# Patient Record
Sex: Female | Born: 1990 | Race: Black or African American | Hispanic: No | Marital: Single | Smoking: Never smoker
Health system: Southern US, Community
[De-identification: ages and names within clinical notes are randomized; demographics above are authoritative.]

## PROBLEM LIST (undated history)

## (undated) ENCOUNTER — Inpatient Hospital Stay (HOSPITAL_COMMUNITY): Payer: Self-pay

## (undated) DIAGNOSIS — E119 Type 2 diabetes mellitus without complications: Secondary | ICD-10-CM

## (undated) DIAGNOSIS — N856 Intrauterine synechiae: Secondary | ICD-10-CM

## (undated) DIAGNOSIS — O24419 Gestational diabetes mellitus in pregnancy, unspecified control: Secondary | ICD-10-CM

## (undated) DIAGNOSIS — E282 Polycystic ovarian syndrome: Secondary | ICD-10-CM

## (undated) DIAGNOSIS — I10 Essential (primary) hypertension: Secondary | ICD-10-CM

## (undated) DIAGNOSIS — N883 Incompetence of cervix uteri: Secondary | ICD-10-CM

## (undated) DIAGNOSIS — O99810 Abnormal glucose complicating pregnancy: Secondary | ICD-10-CM

## (undated) DIAGNOSIS — O8612 Endometritis following delivery: Secondary | ICD-10-CM

## (undated) DIAGNOSIS — Z98891 History of uterine scar from previous surgery: Secondary | ICD-10-CM

## (undated) DIAGNOSIS — R7303 Prediabetes: Secondary | ICD-10-CM

## (undated) HISTORY — DX: Essential (primary) hypertension: I10

## (undated) HISTORY — DX: Prediabetes: R73.03

---
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
## 2010-10-12 ENCOUNTER — Emergency Department (HOSPITAL_COMMUNITY)
Admission: EM | Admit: 2010-10-12 | Discharge: 2010-10-12 | Disposition: A | Discharge status: Home / Self Care | Payer: BC Managed Care – PPO | Attending: Emergency Medicine | Admitting: Emergency Medicine

## 2010-10-12 DIAGNOSIS — N390 Urinary tract infection, site not specified: Secondary | ICD-10-CM | POA: Insufficient documentation

## 2010-10-12 DIAGNOSIS — R300 Dysuria: Secondary | ICD-10-CM | POA: Insufficient documentation

## 2010-10-12 LAB — URINALYSIS, ROUTINE W REFLEX MICROSCOPIC
Bilirubin Urine: NEGATIVE
Ketones, ur: NEGATIVE mg/dL
Protein, ur: 100 mg/dL — AB
Urobilinogen, UA: 1 mg/dL (ref 0.0–1.0)

## 2010-10-12 LAB — URINE MICROSCOPIC-ADD ON

## 2011-09-04 ENCOUNTER — Inpatient Hospital Stay (HOSPITAL_COMMUNITY)
Admission: AD | Admit: 2011-09-04 | Payer: BC Managed Care – PPO | Source: Home / Self Care | Admitting: Obstetrics and Gynecology

## 2012-01-26 DIAGNOSIS — N883 Incompetence of cervix uteri: Secondary | ICD-10-CM

## 2012-01-26 HISTORY — PX: DILATION AND CURETTAGE OF UTERUS: SHX78

## 2012-06-21 ENCOUNTER — Encounter (HOSPITAL_COMMUNITY): Payer: Self-pay | Admitting: *Deleted

## 2012-06-21 ENCOUNTER — Inpatient Hospital Stay (HOSPITAL_COMMUNITY)
Admission: AD | Admit: 2012-06-21 | Discharge: 2012-06-21 | Disposition: A | Discharge status: Home / Self Care | Payer: BC Managed Care – PPO | Source: Ambulatory Visit | Attending: Gynecology | Admitting: Gynecology

## 2012-06-21 DIAGNOSIS — N938 Other specified abnormal uterine and vaginal bleeding: Secondary | ICD-10-CM | POA: Insufficient documentation

## 2012-06-21 DIAGNOSIS — N949 Unspecified condition associated with female genital organs and menstrual cycle: Secondary | ICD-10-CM | POA: Insufficient documentation

## 2012-06-21 DIAGNOSIS — IMO0002 Reserved for concepts with insufficient information to code with codable children: Secondary | ICD-10-CM

## 2012-06-21 DIAGNOSIS — N920 Excessive and frequent menstruation with regular cycle: Secondary | ICD-10-CM

## 2012-06-21 DIAGNOSIS — N925 Other specified irregular menstruation: Secondary | ICD-10-CM | POA: Insufficient documentation

## 2012-06-21 LAB — CBC
Hemoglobin: 11.7 g/dL — ABNORMAL LOW (ref 12.0–15.0)
Platelets: 253 10*3/uL (ref 150–400)
RBC: 4.64 MIL/uL (ref 3.87–5.11)
WBC: 10.2 10*3/uL (ref 4.0–10.5)

## 2012-06-21 LAB — POCT PREGNANCY, URINE: Preg Test, Ur: NEGATIVE

## 2012-06-21 NOTE — MAU Note (Addendum)
Had D&C Jan 16 (17.5days SAB).  Saw doctor 06/04 (neg preg test).  Started bleeding 06/07,none on 06/08, 06/09 got really heavy. Last night was bleeding really really bad.  Lost a lot of blood. Is borderline anemic. Has been nauseated and having abd pain. Called office and was told to come here.  No period per say,  Passed clots in Mar, none in April and had clots again in May- that is why she made the appt.

## 2012-06-21 NOTE — Progress Notes (Signed)
Has MD appt on 6/16, but was advised to come to MAU today due to bleeding. States UPT was negative 6/4. Patient is to return to MD office for serum pregnancy test, stating "they never show positive in my urine."

## 2012-06-21 NOTE — MAU Provider Note (Signed)
History     CSN: [id number]  Arrival date and time: 06/21/12 1806   First Provider Initiated Contact with Patient 06/21/12 1927      Chief Complaint  Patient presents with  . Vaginal Bleeding   HPI Ms. Erika Townsend is a 22 y.o. G2P0020 who presents to MAU today with heavy vaginal bleeding. The patient states that she had a D&C for a failed pregnancy at [facility] on 01/26/12. She has had bleeding off and on since then. Sometimes it is heavier with clots. Last night she started bleeding very heavily with lots of clots. She has been feeling weak and dizzy. She denies a history of heavy periods. She states that she is scheduled for a follow-up US in the office next week to confirm that there are no retained products. She denies N/V or fever.   OB History   Grav Para Term Preterm Abortions TAB SAB Ect Mult Living   2    2  2    0      Past Medical History  Diagnosis Date  . Anemia     Past Surgical History  Procedure Laterality Date  . Dilation and curettage of uterus  01/26/2012    following a miscarriage    Family History  Problem Relation Age of Onset  . Stroke Maternal Grandmother   . Heart disease Maternal Grandmother   . Cancer Maternal Grandfather     History  Substance Use Topics  . Smoking status: Never Smoker   . Smokeless tobacco: Never Used  . Alcohol Use: No    Allergies:  Allergies  Allergen Reactions  . Penicillins Shortness Of Breath and Swelling    Prescriptions prior to admission  Medication Sig Dispense Refill  . metroNIDAZOLE (METROGEL) 0.75 % vaginal gel Place 1 Applicatorful vaginally daily. X 5 days      . Prenatal Vit-Fe Fumarate-FA (PRENATAL MULTIVITAMIN) TABS Take 1 tablet by mouth daily at 12 noon.        Review of Systems  Constitutional: Positive for malaise/fatigue. Negative for fever.  Gastrointestinal: Positive for abdominal pain. Negative for nausea, vomiting, diarrhea and constipation.  Genitourinary: Negative for  dysuria, urgency and frequency.       + vaginal bleeding  Neurological: Positive for dizziness and weakness. Negative for loss of consciousness.   Physical Exam   Blood pressure 125/83, pulse 83, temperature 98.9 F (37.2 C), temperature source Oral, resp. rate 18, weight 248 lb (112.492 kg), last menstrual period 06/16/2012.  Physical Exam  Constitutional: She is oriented to person, place, and time. She appears well-developed and well-nourished. No distress.  HENT:  Head: Normocephalic and atraumatic.  Cardiovascular: Normal rate, regular rhythm and normal heart sounds.   Respiratory: Breath sounds normal. No respiratory distress.  GI: Soft. Bowel sounds are normal. She exhibits no distension and no mass. There is tenderness (mild tenderness to palpatoin of the RUQ and lower abdomen). There is no rebound and no guarding.  Genitourinary: Uterus is not enlarged (exam limited by body habitus) and not tender. Cervix exhibits no motion tenderness, no discharge and no friability. Right adnexum displays no mass and no tenderness. Left adnexum displays tenderness. Left adnexum displays no mass. There is bleeding (scant bleeding noted in the vagina) around the vagina. No vaginal discharge found.  Neurological: She is alert and oriented to person, place, and time.  Skin: Skin is warm and dry. No erythema.  Psychiatric: She has a normal mood and affect.   Results for orders placed during  the hospital encounter of 06/21/12 (from the past 24 hour(s))  POCT PREGNANCY, URINE     Status: None   Collection Time    06/21/12  7:24 PM      Result Value Range   Preg Test, Ur NEGATIVE  NEGATIVE  CBC     Status: Abnormal   Collection Time    06/21/12  7:48 PM      Result Value Range   WBC 10.2  4.0 - 10.5 K/uL   RBC 4.64  3.87 - 5.11 MIL/uL   Hemoglobin 11.7 (*) 12.0 - 15.0 g/dL   HCT 40.9  81.1 - 91.4 %   MCV 78.9  78.0 - 100.0 fL   MCH 25.2 (*) 26.0 - 34.0 pg   MCHC 32.0  30.0 - 36.0 g/dL   RDW  78.2  95.6 - 21.3 %   Platelets 253  150 - 400 K/uL    MAU Course  Procedures None  MDM Discussed with Dr. Renaldo Fiddler. Draw CBC for Hgb and WBCs.  Discussed results with Dr. Renaldo Fiddler. No WBC elevation or significant anemia. Follow-up in the office in the morning for [place] and management.   Assessment and Plan  A: Irregular bleeding s/p D&C  P: Discharge home Bleeding precautions discussed Patient to call office first thing in the morning for follow-up appointment for tomorrow Patient may return to MAU as needed  Freddi Starr, PA-C  06/21/2012, 8:02 PM

## 2012-07-06 ENCOUNTER — Inpatient Hospital Stay (HOSPITAL_COMMUNITY)
Admission: AD | Admit: 2012-07-06 | Discharge: 2012-07-06 | Disposition: A | Discharge status: Home / Self Care | Payer: BC Managed Care – PPO | Source: Ambulatory Visit | Attending: Gynecology | Admitting: Gynecology

## 2012-07-06 ENCOUNTER — Encounter (HOSPITAL_COMMUNITY): Payer: Self-pay

## 2012-07-06 DIAGNOSIS — N390 Urinary tract infection, site not specified: Secondary | ICD-10-CM | POA: Insufficient documentation

## 2012-07-06 DIAGNOSIS — R300 Dysuria: Secondary | ICD-10-CM | POA: Insufficient documentation

## 2012-07-06 LAB — URINE MICROSCOPIC-ADD ON

## 2012-07-06 LAB — CBC
HCT: 36.2 % (ref 36.0–46.0)
Hemoglobin: 11.5 g/dL — ABNORMAL LOW (ref 12.0–15.0)
RDW: 14.9 % (ref 11.5–15.5)
WBC: 11.7 10*3/uL — ABNORMAL HIGH (ref 4.0–10.5)

## 2012-07-06 LAB — URINALYSIS, ROUTINE W REFLEX MICROSCOPIC
Glucose, UA: NEGATIVE mg/dL
Nitrite: POSITIVE — AB
pH: 6 (ref 5.0–8.0)

## 2012-07-06 LAB — WET PREP, GENITAL
Clue Cells Wet Prep HPF POC: NONE SEEN
Trich, Wet Prep: NONE SEEN
Yeast Wet Prep HPF POC: NONE SEEN

## 2012-07-06 MED ORDER — PHENAZOPYRIDINE HCL 100 MG PO TABS
200.0000 mg | ORAL_TABLET | Freq: Once | ORAL | Status: AC
  Administered 2012-07-06: 200 mg via ORAL
  Filled 2012-07-06: qty 2

## 2012-07-06 MED ORDER — PHENAZOPYRIDINE HCL 200 MG PO TABS: 200.0000 mg | ORAL_TABLET | Freq: Three times a day (TID) | ORAL | Status: DC | PRN

## 2012-07-06 MED ORDER — CIPROFLOXACIN HCL 500 MG PO TABS
500.0000 mg | ORAL_TABLET | Freq: Once | ORAL | Status: AC
  Administered 2012-07-06: 500 mg via ORAL
  Filled 2012-07-06: qty 1

## 2012-07-06 MED ORDER — CIPROFLOXACIN HCL 500 MG PO TABS: 500.0000 mg | ORAL_TABLET | Freq: Two times a day (BID) | ORAL | Status: DC

## 2012-07-06 NOTE — MAU Provider Note (Signed)
History     CSN: [id number]  Arrival date and time: 07/06/12 0149   None     Chief Complaint  Patient presents with  . Dysuria   Dysuria     Erika Townsend is a 22 y.o. G2P0020 who presents today with urinary discomfort x 2 days. She states that she feels her bladder is full, but she can't empty it. She also has a white vaginal discharge. She denies any itching, odor or irritation.    Past Medical History  Diagnosis Date  . Anemia     Past Surgical History  Procedure Laterality Date  . Dilation and curettage of uterus  01/26/2012    following a miscarriage    Family History  Problem Relation Age of Onset  . Stroke Maternal Grandmother   . Heart disease Maternal Grandmother   . Cancer Maternal Grandfather     History  Substance Use Topics  . Smoking status: Never Smoker   . Smokeless tobacco: Never Used  . Alcohol Use: No    Allergies:  Allergies  Allergen Reactions  . Penicillins Shortness Of Breath and Swelling    Prescriptions prior to admission  Medication Sig Dispense Refill  . Prenatal Vit-Fe Fumarate-FA (PRENATAL MULTIVITAMIN) TABS Take 1 tablet by mouth daily at 12 noon.      . progesterone (PROMETRIUM) 200 MG capsule Take 200 mg by mouth daily.      . metroNIDAZOLE (METROGEL) 0.75 % vaginal gel Place 1 Applicatorful vaginally daily. X 5 days        Review of Systems  Genitourinary: Positive for dysuria.   Physical Exam   Blood pressure 133/76, pulse 81, temperature 98.4 F (36.9 C), resp. rate 20, height 5' 2.5" (1.588 m), weight 112.038 kg (247 lb), last menstrual period 06/16/2012, SpO2 100.00%.  Physical Exam  Nursing note and vitals reviewed. Constitutional: She is oriented to person, place, and time. She appears well-developed and well-nourished. No distress.  Cardiovascular: Normal rate.   Respiratory: Effort normal.  GI: Soft. There is no tenderness.  Genitourinary:   .External: no lesion Vagina: small amount of white  discharge Cervix: pink, smooth, no CMT Uterus: NSSC Adnexa: NT   Neurological: She is alert and oriented to person, place, and time.  Skin: Skin is warm and dry.  Psychiatric: She has a normal mood and affect.    MAU Course  Procedures  Results for orders placed during the hospital encounter of 07/06/12 (from the past 24 hour(s))  CBC     Status: Abnormal   Collection Time    07/06/12  2:00 AM      Result Value Range   WBC 11.7 (*) 4.0 - 10.5 K/uL   RBC 4.49  3.87 - 5.11 MIL/uL   Hemoglobin 11.5 (*) 12.0 - 15.0 g/dL   HCT 40.9  81.1 - 91.4 %   MCV 80.6  78.0 - 100.0 fL   MCH 25.6 (*) 26.0 - 34.0 pg   MCHC 31.8  30.0 - 36.0 g/dL   RDW 78.2  95.6 - 21.3 %   Platelets 245  150 - 400 K/uL  URINALYSIS, ROUTINE W REFLEX MICROSCOPIC     Status: Abnormal   Collection Time    07/06/12  2:25 AM      Result Value Range   Color, Urine YELLOW  YELLOW   APPearance HAZY (*) CLEAR   Specific Gravity, Urine >1.030 (*) 1.005 - 1.030   pH 6.0  5.0 - 8.0   Glucose, UA NEGATIVE  NEGATIVE mg/dL   Hgb urine dipstick MODERATE (*) NEGATIVE   Bilirubin Urine NEGATIVE  NEGATIVE   Ketones, ur 15 (*) NEGATIVE mg/dL   Protein, ur 30 (*) NEGATIVE mg/dL   Urobilinogen, UA 0.2  0.0 - 1.0 mg/dL   Nitrite POSITIVE (*) NEGATIVE   Leukocytes, UA MODERATE (*) NEGATIVE  URINE MICROSCOPIC-ADD ON     Status: Abnormal   Collection Time    07/06/12  2:25 AM      Result Value Range   Squamous Epithelial / LPF RARE  RARE   WBC, UA 21-50  <3 WBC/hpf   RBC / HPF 3-6  <3 RBC/hpf   Bacteria, UA MANY (*) RARE  POCT PREGNANCY, URINE     Status: None   Collection Time    07/06/12  2:30 AM      Result Value Range   Preg Test, Ur NEGATIVE  NEGATIVE   Bladder scan shows 25cc of urine in the bladder.   Assessment and Plan   1. UTI (lower urinary tract infection)    RX: cipro 500mg  PO BID x 5 Pyridium 200mg  po TID X 2 days FU with GYN as needed  Tawnya Crook 07/06/2012, 2:48 AM

## 2012-07-06 NOTE — MAU Note (Signed)
Pt reports she had a D&C 01/26/2012 , was put on meds 2 weeks ago for prolonged bleeding. States since she started the medicine she has had trouble urinating. States her bladder feels full but she only goes a small amount, also has burning with urination.

## 2012-07-07 LAB — GC/CHLAMYDIA PROBE AMP
CT Probe RNA: NEGATIVE
GC Probe RNA: NEGATIVE

## 2012-07-08 LAB — URINE CULTURE

## 2012-09-06 ENCOUNTER — Inpatient Hospital Stay (HOSPITAL_COMMUNITY)
Admission: AD | Admit: 2012-09-06 | Discharge: 2012-09-06 | Disposition: A | Discharge status: Home / Self Care | Payer: BC Managed Care – PPO | Source: Ambulatory Visit | Attending: Obstetrics & Gynecology | Admitting: Obstetrics & Gynecology

## 2012-09-06 ENCOUNTER — Encounter (HOSPITAL_COMMUNITY): Payer: Self-pay

## 2012-09-06 ENCOUNTER — Inpatient Hospital Stay (HOSPITAL_COMMUNITY): Payer: BC Managed Care – PPO

## 2012-09-06 DIAGNOSIS — O210 Mild hyperemesis gravidarum: Secondary | ICD-10-CM | POA: Insufficient documentation

## 2012-09-06 DIAGNOSIS — O3680X0 Pregnancy with inconclusive fetal viability, not applicable or unspecified: Secondary | ICD-10-CM

## 2012-09-06 DIAGNOSIS — O219 Vomiting of pregnancy, unspecified: Secondary | ICD-10-CM

## 2012-09-06 DIAGNOSIS — R1032 Left lower quadrant pain: Secondary | ICD-10-CM | POA: Insufficient documentation

## 2012-09-06 DIAGNOSIS — O9989 Other specified diseases and conditions complicating pregnancy, childbirth and the puerperium: Secondary | ICD-10-CM

## 2012-09-06 DIAGNOSIS — O26891 Other specified pregnancy related conditions, first trimester: Secondary | ICD-10-CM

## 2012-09-06 LAB — URINALYSIS, ROUTINE W REFLEX MICROSCOPIC
Bilirubin Urine: NEGATIVE
Glucose, UA: NEGATIVE mg/dL
Hgb urine dipstick: NEGATIVE
Specific Gravity, Urine: 1.02 (ref 1.005–1.030)
Urobilinogen, UA: 1 mg/dL (ref 0.0–1.0)
pH: 7 (ref 5.0–8.0)

## 2012-09-06 LAB — WET PREP, GENITAL
Clue Cells Wet Prep HPF POC: NONE SEEN
Trich, Wet Prep: NONE SEEN

## 2012-09-06 LAB — CBC
HCT: 34.5 % — ABNORMAL LOW (ref 36.0–46.0)
MCHC: 33.9 g/dL (ref 30.0–36.0)
MCV: 80.6 fL (ref 78.0–100.0)
RDW: 13.8 % (ref 11.5–15.5)

## 2012-09-06 LAB — COMPREHENSIVE METABOLIC PANEL
BUN: 7 mg/dL (ref 6–23)
CO2: 22 mEq/L (ref 19–32)
Calcium: 9.3 mg/dL (ref 8.4–10.5)
Creatinine, Ser: 0.75 mg/dL (ref 0.50–1.10)
GFR calc Af Amer: >90 mL/min (ref >90)
GFR calc non Af Amer: >90 mL/min (ref >90)
Glucose, Bld: 80 mg/dL (ref 70–99)

## 2012-09-06 LAB — POCT PREGNANCY, URINE: Preg Test, Ur: POSITIVE — AB

## 2012-09-06 MED ORDER — ONDANSETRON HCL 4 MG PO TABS: 4.0000 mg | ORAL_TABLET | Freq: Four times a day (QID) | ORAL | Status: DC

## 2012-09-06 MED ORDER — PROMETHAZINE HCL 25 MG PO TABS: 25.0000 mg | ORAL_TABLET | Freq: Four times a day (QID) | ORAL | Status: DC | PRN

## 2012-09-06 MED ORDER — ONDANSETRON 8 MG PO TBDP
8.0000 mg | ORAL_TABLET | Freq: Once | ORAL | Status: AC
  Administered 2012-09-06: 8 mg via ORAL
  Filled 2012-09-06: qty 1

## 2012-09-06 NOTE — MAU Note (Signed)
Patient states she has been a patient of Dr. Chevis Pretty and has had a positive pregnancy test and has had an ultrasound with a due date of 04-22-13. Does not plan to go back to that practice. Patient states she has had vomiting of everything she eats or drinks for 3 days. Has had abdominal cramping. Denies bleeding or discharge.  Has periods of feeling dizzy.

## 2012-09-06 NOTE — MAU Provider Note (Signed)
Attestation of Attending Supervision of Advanced Practitioner (PA/CNM/NP): Evaluation and management procedures were performed by the Advanced Practitioner under my supervision and collaboration.  I have reviewed the Advanced Practitioner's note and chart, and I agree with the management and plan.  Taquanna Borras, MD, FACOG Attending Obstetrician & Gynecologist Faculty Practice, [facility]  

## 2012-09-06 NOTE — MAU Provider Note (Signed)
History     CSN: [id number]  Arrival date and time: 09/06/12 1008   First Provider Initiated Contact with Patient 09/06/12 1108      Chief Complaint  Patient presents with  . Emesis During Pregnancy  . Abdominal Pain   HPI Erika Townsend is a 22 y.o. G2P0010 at [redacted]w[redacted]d who presents to MAU today with complaint of N/V x 3 days and lower abdominal cramping. The patient rates her pain at 8/10 now. She has not taken any pain medication. She is also having some dizziness and feeling lightheaded. She states that the pain is more prominent in the LLQ. She denies UTI symptoms, vaginal bleeding, discharge, fever, diarrhea or constipation.    OB History   Grav Para Term Preterm Abortions TAB SAB Ect Mult Living   2    1  1    0      Past Medical History  Diagnosis Date  . Anemia     Past Surgical History  Procedure Laterality Date  . Dilation and curettage of uterus  01/26/2012    following a miscarriage    Family History  Problem Relation Age of Onset  . Stroke Maternal Grandmother   . Heart disease Maternal Grandmother   . Cancer Maternal Grandfather     History  Substance Use Topics  . Smoking status: Never Smoker   . Smokeless tobacco: Never Used  . Alcohol Use: No    Allergies:  Allergies  Allergen Reactions  . Penicillins Shortness Of Breath and Swelling    No prescriptions prior to admission    Review of Systems  Constitutional: Negative for fever and malaise/fatigue.  Gastrointestinal: Positive for nausea, vomiting and abdominal pain. Negative for diarrhea and constipation.  Genitourinary: Negative for dysuria, urgency and frequency.       Neg - vaginal bleeding ,discharge  Neurological: Positive for dizziness and weakness. Negative for loss of consciousness.   Physical Exam   Blood pressure 115/57, pulse 71, temperature 98.8 F (37.1 C), temperature source Oral, resp. rate 18, height 5\' 3"  (1.6 m), weight 248 lb 6.4 oz (112.674 kg), last menstrual  period 07/16/2012, SpO2 100.00%.  Physical Exam  Constitutional: She is oriented to person, place, and time. She appears well-developed and well-nourished. No distress.  HENT:  Head: Normocephalic and atraumatic.  Cardiovascular: Normal rate, regular rhythm and normal heart sounds.   Respiratory: Effort normal and breath sounds normal. No respiratory distress.  GI: Soft. Bowel sounds are normal. She exhibits no distension and no mass. There is tenderness (moderate tenderness to palpation of the lower abdomen more prominent on the LLQ). There is no rebound and no guarding.  Genitourinary: Uterus is not enlarged (exam limited by maternal body habitus) and not tender. Cervix exhibits no motion tenderness, no discharge and no friability. Right adnexum displays tenderness. Right adnexum displays no mass. Left adnexum displays tenderness. Left adnexum displays no mass. No bleeding around the vagina. Vaginal discharge (small amount of thin, white discharge noted) found.  Neurological: She is alert and oriented to person, place, and time.  Skin: Skin is warm and dry. No erythema.  Psychiatric: She has a normal mood and affect.   Results for orders placed during the hospital encounter of 09/06/12 (from the past 24 hour(s))  URINALYSIS, ROUTINE W REFLEX MICROSCOPIC     Status: None   Collection Time    09/06/12 10:30 AM      Result Value Range   Color, Urine YELLOW  YELLOW   APPearance CLEAR  CLEAR   Specific Gravity, Urine 1.020  1.005 - 1.030   pH 7.0  5.0 - 8.0   Glucose, UA NEGATIVE  NEGATIVE mg/dL   Hgb urine dipstick NEGATIVE  NEGATIVE   Bilirubin Urine NEGATIVE  NEGATIVE   Ketones, ur NEGATIVE  NEGATIVE mg/dL   Protein, ur NEGATIVE  NEGATIVE mg/dL   Urobilinogen, UA 1.0  0.0 - 1.0 mg/dL   Nitrite NEGATIVE  NEGATIVE   Leukocytes, UA NEGATIVE  NEGATIVE  POCT PREGNANCY, URINE     Status: Abnormal   Collection Time    09/06/12 10:41 AM      Result Value Range   Preg Test, Ur POSITIVE (*)  NEGATIVE  WET PREP, GENITAL     Status: Abnormal   Collection Time    09/06/12 11:17 AM      Result Value Range   Yeast Wet Prep HPF POC NONE SEEN  NONE SEEN   Trich, Wet Prep NONE SEEN  NONE SEEN   Clue Cells Wet Prep HPF POC NONE SEEN  NONE SEEN   WBC, Wet Prep HPF POC FEW (*) NONE SEEN  CBC     Status: Abnormal   Collection Time    09/06/12 11:40 AM      Result Value Range   WBC 11.5 (*) 4.0 - 10.5 K/uL   RBC 4.28  3.87 - 5.11 MIL/uL   Hemoglobin 11.7 (*) 12.0 - 15.0 g/dL   HCT 72.5 (*) 36.6 - 44.0 %   MCV 80.6  78.0 - 100.0 fL   MCH 27.3  26.0 - 34.0 pg   MCHC 33.9  30.0 - 36.0 g/dL   RDW 34.7  42.5 - 95.6 %   Platelets 244  150 - 400 K/uL  ABO/RH     Status: None   Collection Time    09/06/12 11:40 AM      Result Value Range   ABO/RH(D) O POS    HCG, QUANTITATIVE, PREGNANCY     Status: Abnormal   Collection Time    09/06/12 11:40 AM      Result Value Range   hCG, Beta Chain, Quant, S [id number] (*) <5 mIU/mL  COMPREHENSIVE METABOLIC PANEL     Status: None   Collection Time    09/06/12 11:40 AM      Result Value Range   Sodium 136  135 - 145 mEq/L   Potassium 3.6  3.5 - 5.1 mEq/L   Chloride 102  96 - 112 mEq/L   CO2 22  19 - 32 mEq/L   Glucose, Bld 80  70 - 99 mg/dL   BUN 7  6 - 23 mg/dL   Creatinine, Ser 4.33  0.50 - 1.10 mg/dL   Calcium 9.3  8.4 - 29.5 mg/dL   Total Protein 6.6  6.0 - 8.3 g/dL   Albumin 3.5  3.5 - 5.2 g/dL   AST 14  0 - 37 U/L   ALT 8  0 - 35 U/L   Alkaline Phosphatase 50  39 - 117 U/L   Total Bilirubin 0.6  0.3 - 1.2 mg/dL   GFR calc non Af Amer >90  >90 mL/min   GFR calc Af Amer >90  >90 mL/min    MAU Course  Procedures None  MDM ODT Zofran Wet prep, GC/Chlamydia, CBC, CMP, ABO/Rh, quant hCG and [place] today  Assessment and Plan  A: IUP at  7w 3d Nausea and vomiting in pregnancy Abdominal pain in pregnancy  P: Discharge home First trimester  warning signs discussed Rx for phenergan and zofran sent to patient's pharmacy Patient  referred to [facility] clinic for prenatal care Patient may return to MAU as needed or if her condition were to change or worsen  Freddi Starr, PA-C  09/06/2012, 3:00 PM

## 2012-09-15 ENCOUNTER — Inpatient Hospital Stay (HOSPITAL_COMMUNITY): Payer: BC Managed Care – PPO

## 2012-09-15 ENCOUNTER — Inpatient Hospital Stay (HOSPITAL_COMMUNITY)
Admission: AD | Admit: 2012-09-15 | Discharge: 2012-09-15 | Disposition: A | Discharge status: Home / Self Care | Payer: BC Managed Care – PPO | Source: Ambulatory Visit | Attending: Obstetrics and Gynecology | Admitting: Obstetrics and Gynecology

## 2012-09-15 ENCOUNTER — Encounter (HOSPITAL_COMMUNITY): Payer: Self-pay | Admitting: *Deleted

## 2012-09-15 DIAGNOSIS — O209 Hemorrhage in early pregnancy, unspecified: Secondary | ICD-10-CM

## 2012-09-15 DIAGNOSIS — N390 Urinary tract infection, site not specified: Secondary | ICD-10-CM | POA: Insufficient documentation

## 2012-09-15 DIAGNOSIS — O4690 Antepartum hemorrhage, unspecified, unspecified trimester: Secondary | ICD-10-CM

## 2012-09-15 DIAGNOSIS — O2390 Unspecified genitourinary tract infection in pregnancy, unspecified trimester: Secondary | ICD-10-CM | POA: Insufficient documentation

## 2012-09-15 LAB — URINALYSIS, ROUTINE W REFLEX MICROSCOPIC
Bilirubin Urine: NEGATIVE
Nitrite: POSITIVE — AB
Specific Gravity, Urine: 1.025 (ref 1.005–1.030)
Urobilinogen, UA: 1 mg/dL (ref 0.0–1.0)

## 2012-09-15 LAB — URINE MICROSCOPIC-ADD ON

## 2012-09-15 MED ORDER — PHENAZOPYRIDINE HCL 200 MG PO TABS: 200.0000 mg | ORAL_TABLET | Freq: Three times a day (TID) | ORAL | Status: DC

## 2012-09-15 MED ORDER — NITROFURANTOIN MONOHYD MACRO 100 MG PO CAPS: 100.0000 mg | ORAL_CAPSULE | Freq: Two times a day (BID) | ORAL | Status: DC

## 2012-09-15 NOTE — MAU Provider Note (Signed)
History     CSN: [id number]  Arrival date and time: 09/15/12 1105   First Provider Initiated Contact with Patient 09/15/12 1148      Chief Complaint  Patient presents with  . Vaginal Bleeding   HPI  Pt is G2P0010 and had intercourse this morning and had PC bleeding.  [facility] RN note: Registered Nurse Addendum MAU Note Service date: 09/15/2012 11:26 AM   Arrived via EMS. Bleeding started this morning around 1030. No clots. Cramping some. States it was painful when she tried to void for specimen and refused to try again.    Past Medical History  Diagnosis Date  . Anemia     Past Surgical History  Procedure Laterality Date  . Dilation and curettage of uterus  01/26/2012    following a miscarriage    Family History  Problem Relation Age of Onset  . Stroke Maternal Grandmother   . Heart disease Maternal Grandmother   . Cancer Maternal Grandfather     History  Substance Use Topics  . Smoking status: Never Smoker   . Smokeless tobacco: Never Used  . Alcohol Use: No    Allergies:  Allergies  Allergen Reactions  . Penicillins Shortness Of Breath and Swelling    Prescriptions prior to admission  Medication Sig Dispense Refill  . ondansetron (ZOFRAN) 4 MG tablet Take 1 tablet (4 mg total) by mouth every 6 (six) hours.  12 tablet  0  . Prenatal Vit-Fe Fumarate-FA (PRENATAL MULTIVITAMIN) TABS Take 1 tablet by mouth daily at 12 noon.      . promethazine (PHENERGAN) 25 MG tablet Take 1 tablet (25 mg total) by mouth every 6 (six) hours as needed for nausea.  30 tablet  0    Review of Systems  Constitutional: Negative for fever and chills.  Gastrointestinal: Positive for nausea and vomiting. Negative for abdominal pain, diarrhea and constipation.  Genitourinary: Positive for dysuria. Negative for urgency and frequency.   Physical Exam   Blood pressure 110/69, pulse 78, temperature 97.9 F (36.6 C), temperature source Oral, resp.  rate 18, last menstrual period 07/16/2012.  Physical Exam  Nursing note and vitals reviewed. Constitutional: She is oriented to person, place, and time. She appears well-developed and well-nourished. No distress.  HENT:  Head: Normocephalic.  Eyes: Pupils are equal, round, and reactive to light.  Neck: Normal range of motion. Neck supple.  Cardiovascular: Normal rate.   Respiratory: Effort normal.  GI: Soft. She exhibits no distension. There is no tenderness. There is no rebound and no guarding.  Genitourinary:  Small amount of bright red blood in vault; cervix unable to be visualized due to habitus; bimanual cervix closed nontender  Musculoskeletal: Normal range of motion.  Neurological: She is alert and oriented to person, place, and time.  Skin: Skin is warm and dry.  Psychiatric: She has a normal mood and affect.    MAU Course  Procedures Results for orders placed during the hospital encounter of 09/15/12 (from the past 24 hour(s))  URINALYSIS, ROUTINE W REFLEX MICROSCOPIC     Status: Abnormal   Collection Time    09/15/12 11:40 AM      Result Value Range   Color, Urine RED (*) YELLOW   APPearance CLOUDY (*) CLEAR   Specific Gravity, Urine 1.025  1.005 - 1.030   pH 6.0  5.0 - 8.0   Glucose, UA NEGATIVE  NEGATIVE mg/dL   Hgb urine dipstick LARGE (*) NEGATIVE  Bilirubin Urine NEGATIVE  NEGATIVE   Ketones, ur 15 (*) NEGATIVE mg/dL   Protein, ur 295 (*) NEGATIVE mg/dL   Urobilinogen, UA 1.0  0.0 - 1.0 mg/dL   Nitrite POSITIVE (*) NEGATIVE   Leukocytes, UA TRACE (*) NEGATIVE  URINE MICROSCOPIC-ADD ON     Status: Abnormal   Collection Time    09/15/12 11:40 AM      Result Value Range   Squamous Epithelial / LPF RARE  RARE   WBC, UA 3-6  <3 WBC/hpf   RBC / HPF TOO NUMEROUS TO COUNT  <3 RBC/hpf   Bacteria, UA FEW (*) RARE  US Ob Transvaginal  09/15/2012   *RADIOLOGY REPORT*  Clinical Data: 8 weeks 5 days pregnant with bleeding and pain. Evaluate viability.  TRANSVAGINAL OB  ULTRASOUND  Technique:  Transvaginal ultrasound was performed for evaluation of the gestation as well as the maternal uterus and adnexal regions.  Comparison: 09/06/2012  Findings: Intrauterine gestational sac identified.  Fetal pole with crown-rump length of 1.9 cm.  This corresponds to 8 weeks 4 days. Yolk sac identified.  Cardiac activity, with a heart rate of 165 beats per minute.  No residual subchorionic hemorrhage.  The ovaries are within normal limits.  Trace free pelvic fluid is likely physiologic.  IMPRESSION: Intrauterine pregnancy of 8 weeks 4 days with fetal heart rate of 165 beats per minute.  Resolution of previously described subchorionic hemorrhage.   Original Report Authenticated By: Jeronimo Greaves, M.D.   Urine culture pending;  pt left before results of urine were reviewed- attempted to contact pt- contact number for pt and emergency contact were not valid [facility] [address] [phone] to see if they have contact information- unable to get assistance on phone- LM for pharmacy to contact pt to let her know prescriptions were at pharmacy and also to let us know the pt got the message Dr. Marcelle Overlie aware  Assessment and Plan  Bleeding in pregnancy UTI in pregnancy- Macrobid and Pyridium sent to [facility]- unable to contact pt due to invalid numbers- LM for pharmacy to contact pt   Newell Wafer 09/15/2012, 11:48 AM

## 2012-09-15 NOTE — MAU Note (Addendum)
Arrived via EMS. Bleeding started this morning around 1030. No clots. Cramping some. States it was painful when she tried to void for specimen and refused to try again.

## 2012-09-15 NOTE — MAU Note (Signed)
Pt reprots she started having bright red vaginal bleeding this morning. Also C/amping.

## 2012-09-16 ENCOUNTER — Encounter (HOSPITAL_COMMUNITY): Payer: Self-pay | Admitting: *Deleted

## 2012-09-16 ENCOUNTER — Inpatient Hospital Stay (HOSPITAL_COMMUNITY)
Admission: AD | Admit: 2012-09-16 | Discharge: 2012-09-16 | Disposition: A | Discharge status: Home / Self Care | Payer: BC Managed Care – PPO | Source: Ambulatory Visit | Attending: Obstetrics and Gynecology | Admitting: Obstetrics and Gynecology

## 2012-09-16 DIAGNOSIS — O26891 Other specified pregnancy related conditions, first trimester: Secondary | ICD-10-CM

## 2012-09-16 DIAGNOSIS — R109 Unspecified abdominal pain: Secondary | ICD-10-CM | POA: Insufficient documentation

## 2012-09-16 DIAGNOSIS — O9989 Other specified diseases and conditions complicating pregnancy, childbirth and the puerperium: Secondary | ICD-10-CM

## 2012-09-16 DIAGNOSIS — N949 Unspecified condition associated with female genital organs and menstrual cycle: Secondary | ICD-10-CM

## 2012-09-16 DIAGNOSIS — O209 Hemorrhage in early pregnancy, unspecified: Secondary | ICD-10-CM | POA: Insufficient documentation

## 2012-09-16 LAB — CBC
Hemoglobin: 11.7 g/dL — ABNORMAL LOW (ref 12.0–15.0)
MCHC: 33.8 g/dL (ref 30.0–36.0)
RDW: 13.9 % (ref 11.5–15.5)

## 2012-09-16 LAB — URINALYSIS, ROUTINE W REFLEX MICROSCOPIC
Glucose, UA: NEGATIVE mg/dL
Nitrite: NEGATIVE
Protein, ur: NEGATIVE mg/dL
Urobilinogen, UA: 0.2 mg/dL (ref 0.0–1.0)

## 2012-09-16 LAB — URINE MICROSCOPIC-ADD ON

## 2012-09-16 MED ORDER — ONDANSETRON 8 MG PO TBDP: 8.0000 mg | ORAL_TABLET | Freq: Once | ORAL | Status: DC

## 2012-09-16 NOTE — MAU Provider Note (Signed)
  History     CSN: [id number]  Arrival date and time: 09/16/12 2142   First Provider Initiated Contact with Patient 09/16/12 2223      Chief Complaint  Patient presents with  . Abdominal Cramping  . Vaginal Bleeding   HPI  Erika Townsend is a 22 y.o. G2P0010 at [redacted]w[redacted]d who presents today with cramping and bleeding. She was seen yesterday for the same complaint and had an ultrasound done that showed a viable IUP. She states the pain is the same in nature, but slightly worse today. The bleeding is less today and is changing from red to brown.   Past Medical History  Diagnosis Date  . Anemia     Past Surgical History  Procedure Laterality Date  . Dilation and curettage of uterus  01/26/2012    following a miscarriage    Family History  Problem Relation Age of Onset  . Stroke Maternal Grandmother   . Heart disease Maternal Grandmother   . Cancer Maternal Grandfather     History  Substance Use Topics  . Smoking status: Never Smoker   . Smokeless tobacco: Never Used  . Alcohol Use: No    Allergies:  Allergies  Allergen Reactions  . Cinnamon Shortness Of Breath  . Penicillins Shortness Of Breath and Swelling    Prescriptions prior to admission  Medication Sig Dispense Refill  . Prenatal Vit-Fe Fumarate-FA (PRENATAL MULTIVITAMIN) TABS Take 1 tablet by mouth daily at 12 noon.      . nitrofurantoin, macrocrystal-monohydrate, (MACROBID) 100 MG capsule Take 1 capsule (100 mg total) by mouth 2 (two) times daily.  10 capsule  0  . phenazopyridine (PYRIDIUM) 200 MG tablet Take 1 tablet (200 mg total) by mouth 3 (three) times daily.  6 tablet  0    ROS Physical Exam   Blood pressure 109/66, pulse 83, temperature 97.4 F (36.3 C), temperature source Oral, resp. rate 18, height 5\' 4"  (1.626 m), weight 115.032 kg (253 lb 9.6 oz), last menstrual period 07/16/2012, SpO2 100.00%.  Physical Exam  MAU Course  Procedures  Results for orders placed during the hospital encounter  of 09/16/12 (from the past 24 hour(s))  CBC     Status: Abnormal   Collection Time    09/16/12 10:05 PM      Result Value Range   WBC 15.4 (*) 4.0 - 10.5 K/uL   RBC 4.24  3.87 - 5.11 MIL/uL   Hemoglobin 11.7 (*) 12.0 - 15.0 g/dL   HCT 30.8 (*) 65.7 - 84.6 %   MCV 81.6  78.0 - 100.0 fL   MCH 27.6  26.0 - 34.0 pg   MCHC 33.8  30.0 - 36.0 g/dL   RDW 96.2  95.2 - 84.1 %   Platelets 244  150 - 400 K/uL   2246: D/W Dr. Marcelle Overlie, ok for dc home. Plan to FU with the office this week and they will possibly repeat the ultrasound in the office.   Assessment and Plan   1. Pelvic pain complicating pregnancy, antepartum, first trimester    Danger signs reviewed Comfort measures reviewed FU with the office this week Tawnya Crook 09/16/2012, 10:31 PM

## 2012-09-16 NOTE — MAU Note (Signed)
Pt G2 P0 at 8.6wks with bleeding and cramping since Saturday, seen in MAU-was told everything is ok.  Pt having increased pain tonight, continues to have small amt of bleeding, no clots.

## 2012-09-17 LAB — URINE CULTURE: Colony Count: 15000

## 2012-10-13 ENCOUNTER — Inpatient Hospital Stay (HOSPITAL_COMMUNITY)
Admission: AD | Admit: 2012-10-13 | Discharge: 2012-10-13 | Disposition: A | Discharge status: Home / Self Care | Payer: BC Managed Care – PPO | Source: Ambulatory Visit | Attending: Obstetrics and Gynecology | Admitting: Obstetrics and Gynecology

## 2012-10-13 ENCOUNTER — Encounter (HOSPITAL_COMMUNITY): Payer: Self-pay | Admitting: *Deleted

## 2012-10-13 DIAGNOSIS — R0602 Shortness of breath: Secondary | ICD-10-CM | POA: Insufficient documentation

## 2012-10-13 DIAGNOSIS — O99891 Other specified diseases and conditions complicating pregnancy: Secondary | ICD-10-CM | POA: Insufficient documentation

## 2012-10-13 DIAGNOSIS — O210 Mild hyperemesis gravidarum: Secondary | ICD-10-CM | POA: Insufficient documentation

## 2012-10-13 DIAGNOSIS — R112 Nausea with vomiting, unspecified: Secondary | ICD-10-CM

## 2012-10-13 LAB — URINALYSIS, ROUTINE W REFLEX MICROSCOPIC
Bilirubin Urine: NEGATIVE
Glucose, UA: NEGATIVE mg/dL
Hgb urine dipstick: NEGATIVE
Ketones, ur: NEGATIVE mg/dL
pH: 6.5 (ref 5.0–8.0)

## 2012-10-13 MED ORDER — PROMETHAZINE HCL 25 MG/ML IJ SOLN
25.0000 mg | Freq: Once | INTRAVENOUS | Status: AC
  Administered 2012-10-13: 25 mg via INTRAVENOUS
  Filled 2012-10-13: qty 1

## 2012-10-13 MED ORDER — LACTATED RINGERS IV BOLUS (SEPSIS): 1000.0000 mL | Freq: Once | INTRAVENOUS | Status: DC

## 2012-10-13 NOTE — MAU Provider Note (Signed)
History     CSN: [id number]  Arrival date and time: 10/13/12 0043   None     Chief Complaint  Patient presents with  . Morning Sickness  . Emesis During Pregnancy  . Shortness of Breath  . Headache   HPI  Ms. Erika Townsend is a is a 22 y.o. female G2P0010 at [redacted]w[redacted]d who presents to MAU with shortness of breath, nausea and vomiting. She is actively vomiting in MAU. Today she has tried chicken, fries, potato with gravy, saltine crackers and peanut butter. The symptoms started today and have progressively gotten worse. She feels every time she eats it all comes back up. She has zofran and phenergan at home and did not try anything to help the nausea because she feels the medication does not work.   OB History   Grav Para Term Preterm Abortions TAB SAB Ect Mult Living   2    1  1    0      Past Medical History  Diagnosis Date  . Anemia     Past Surgical History  Procedure Laterality Date  . Dilation and curettage of uterus  01/26/2012    following a miscarriage    Family History  Problem Relation Age of Onset  . Stroke Maternal Grandmother   . Heart disease Maternal Grandmother   . Cancer Maternal Grandfather     History  Substance Use Topics  . Smoking status: Never Smoker   . Smokeless tobacco: Never Used  . Alcohol Use: No    Allergies:  Allergies  Allergen Reactions  . Cinnamon Shortness Of Breath  . Penicillins Shortness Of Breath and Swelling    Prescriptions prior to admission  Medication Sig Dispense Refill  . nitrofurantoin, macrocrystal-monohydrate, (MACROBID) 100 MG capsule Take 1 capsule (100 mg total) by mouth 2 (two) times daily.  10 capsule  0  . phenazopyridine (PYRIDIUM) 200 MG tablet Take 1 tablet (200 mg total) by mouth 3 (three) times daily.  6 tablet  0  . Prenatal Vit-Fe Fumarate-FA (PRENATAL MULTIVITAMIN) TABS Take 1 tablet by mouth daily at 12 noon.       Results for orders placed during the hospital encounter of 10/13/12 (from the  past 24 hour(s))  URINALYSIS, ROUTINE W REFLEX MICROSCOPIC     Status: None   Collection Time    10/13/12 12:50 AM      Result Value Range   Color, Urine YELLOW  YELLOW   APPearance CLEAR  CLEAR   Specific Gravity, Urine 1.025  1.005 - 1.030   pH 6.5  5.0 - 8.0   Glucose, UA NEGATIVE  NEGATIVE mg/dL   Hgb urine dipstick NEGATIVE  NEGATIVE   Bilirubin Urine NEGATIVE  NEGATIVE   Ketones, ur NEGATIVE  NEGATIVE mg/dL   Protein, ur NEGATIVE  NEGATIVE mg/dL   Urobilinogen, UA 0.2  0.0 - 1.0 mg/dL   Nitrite NEGATIVE  NEGATIVE   Leukocytes, UA NEGATIVE  NEGATIVE    Review of Systems  Constitutional: Negative for fever and chills.  Gastrointestinal: Positive for nausea and vomiting. Negative for abdominal pain, diarrhea and constipation.  Genitourinary: Negative for dysuria, urgency, frequency and hematuria.       No vaginal discharge. No vaginal bleeding. No dysuria.   Neurological: Positive for dizziness, tremors and weakness. Negative for headaches.   Physical Exam   Blood pressure 110/73, pulse 95, temperature 98.2 F (36.8 C), temperature source Oral, resp. rate 20, last menstrual period 07/16/2012, SpO2 100.00%. Fetal heart tones by  doppler: 166 bpm  Physical Exam  Constitutional: She is oriented to person, place, and time. She appears well-developed and well-nourished. No distress.  HENT:  Head: Normocephalic.  Neck: Neck supple.  Cardiovascular: Normal rate.   Respiratory: Effort normal. No respiratory distress. She has no wheezes.  GI: Soft. She exhibits no distension. There is no tenderness. There is no rebound and no guarding.  Neurological: She is alert and oriented to person, place, and time.  Skin: Skin is warm and dry. She is not diaphoretic.  Psychiatric: Her behavior is normal.    MAU Course  Procedures None  MDM +fht UA LR bolus with phenergan 25 mg. Pt resting well and has not had any further episodes of vomiting following phenergan infusion.   Consulted with Dr. Henderson Cloud at 0200: ok to discharge home  Assessment and Plan  A: Nausea and vomiting in pregnancy   P: Discharge home Take your medication as prescribed if vomiting occurs BRAT diet discussed Return to MAU if symptoms worsen Keep your scheduled appointment with your OB Dr.   Robley Fries, Victorino Dike IRENE FNP-C 10/13/2012, 3:08 AM

## 2012-10-13 NOTE — MAU Note (Signed)
Patient complains of nausea and vomiting all day. Shortness of breath and migraine for the last hour. Has medication for nausea and vomiting at home but did not take any.

## 2012-10-13 NOTE — MAU Note (Signed)
Patient states that the headache does not hurt.

## 2012-10-16 ENCOUNTER — Encounter: Payer: BC Managed Care – PPO | Admitting: Obstetrics and Gynecology

## 2012-10-16 ENCOUNTER — Ambulatory Visit (HOSPITAL_COMMUNITY): Payer: BC Managed Care – PPO | Attending: Obstetrics and Gynecology

## 2012-10-16 ENCOUNTER — Encounter: Payer: Self-pay | Admitting: Obstetrics and Gynecology

## 2012-10-18 ENCOUNTER — Other Ambulatory Visit: Payer: Self-pay

## 2012-10-18 ENCOUNTER — Encounter (HOSPITAL_COMMUNITY): Payer: Self-pay | Admitting: Pharmacist

## 2012-10-18 LAB — OB RESULTS CONSOLE GC/CHLAMYDIA
Chlamydia: NEGATIVE
Gonorrhea: NEGATIVE

## 2012-10-18 LAB — OB RESULTS CONSOLE RPR: RPR: NONREACTIVE

## 2012-10-18 LAB — OB RESULTS CONSOLE HIV ANTIBODY (ROUTINE TESTING): HIV: NONREACTIVE

## 2012-10-19 ENCOUNTER — Encounter: Payer: Self-pay | Admitting: *Deleted

## 2012-10-24 ENCOUNTER — Ambulatory Visit: Admit: 2012-10-24 | Payer: BC Managed Care – PPO | Admitting: Obstetrics and Gynecology

## 2012-10-24 SURGERY — CERCLAGE, CERVIX, VAGINAL APPROACH
Anesthesia: Spinal

## 2012-11-01 ENCOUNTER — Encounter: Payer: Self-pay | Admitting: *Deleted

## 2012-11-10 ENCOUNTER — Encounter (HOSPITAL_COMMUNITY): Payer: BC Managed Care – PPO | Admitting: Anesthesiology

## 2012-11-10 ENCOUNTER — Observation Stay (HOSPITAL_COMMUNITY)
Admission: AD | Admit: 2012-11-10 | Discharge: 2012-11-11 | Disposition: A | Discharge status: Home / Self Care | Payer: BC Managed Care – PPO | Source: Ambulatory Visit | Attending: Obstetrics and Gynecology | Admitting: Obstetrics and Gynecology

## 2012-11-10 ENCOUNTER — Ambulatory Visit (HOSPITAL_COMMUNITY): Payer: BC Managed Care – PPO

## 2012-11-10 ENCOUNTER — Inpatient Hospital Stay (HOSPITAL_COMMUNITY): Payer: BC Managed Care – PPO | Admitting: Anesthesiology

## 2012-11-10 ENCOUNTER — Inpatient Hospital Stay (HOSPITAL_COMMUNITY): Payer: BC Managed Care – PPO

## 2012-11-10 ENCOUNTER — Encounter (HOSPITAL_COMMUNITY): Payer: Self-pay

## 2012-11-10 ENCOUNTER — Encounter (HOSPITAL_COMMUNITY)
Admission: AD | Disposition: A | Discharge status: Home / Self Care | Payer: Self-pay | Source: Ambulatory Visit | Attending: Obstetrics and Gynecology

## 2012-11-10 DIAGNOSIS — N390 Urinary tract infection, site not specified: Secondary | ICD-10-CM

## 2012-11-10 DIAGNOSIS — O3430 Maternal care for cervical incompetence, unspecified trimester: Principal | ICD-10-CM | POA: Insufficient documentation

## 2012-11-10 DIAGNOSIS — O26879 Cervical shortening, unspecified trimester: Secondary | ICD-10-CM | POA: Insufficient documentation

## 2012-11-10 DIAGNOSIS — O26859 Spotting complicating pregnancy, unspecified trimester: Secondary | ICD-10-CM | POA: Insufficient documentation

## 2012-11-10 DIAGNOSIS — O2390 Unspecified genitourinary tract infection in pregnancy, unspecified trimester: Secondary | ICD-10-CM | POA: Insufficient documentation

## 2012-11-10 DIAGNOSIS — O26872 Cervical shortening, second trimester: Secondary | ICD-10-CM

## 2012-11-10 DIAGNOSIS — N883 Incompetence of cervix uteri: Secondary | ICD-10-CM | POA: Diagnosis present

## 2012-11-10 DIAGNOSIS — O3432 Maternal care for cervical incompetence, second trimester: Secondary | ICD-10-CM

## 2012-11-10 HISTORY — PX: CERVICAL CERCLAGE: SHX1329

## 2012-11-10 LAB — URINALYSIS, ROUTINE W REFLEX MICROSCOPIC
Glucose, UA: NEGATIVE mg/dL
Nitrite: NEGATIVE
Protein, ur: NEGATIVE mg/dL
Specific Gravity, Urine: 1.02 (ref 1.005–1.030)
pH: 6.5 (ref 5.0–8.0)

## 2012-11-10 LAB — URINE MICROSCOPIC-ADD ON

## 2012-11-10 LAB — CBC
MCHC: 33.7 g/dL (ref 30.0–36.0)
MCV: 81.5 fL (ref 78.0–100.0)
RDW: 13.6 % (ref 11.5–15.5)

## 2012-11-10 LAB — TYPE AND SCREEN
ABO/RH(D): O POS
Antibody Screen: NEGATIVE

## 2012-11-10 SURGERY — CERCLAGE, CERVIX, VAGINAL APPROACH
Anesthesia: Spinal | Site: Vagina | Wound class: Clean Contaminated

## 2012-11-10 MED ORDER — CALCIUM CARBONATE ANTACID 500 MG PO CHEW: 2.0000 | CHEWABLE_TABLET | ORAL | Status: DC | PRN

## 2012-11-10 MED ORDER — SODIUM CHLORIDE 0.9 % IJ SOLN
3.0000 mL | INTRAMUSCULAR | Status: DC | PRN
  Administered 2012-11-11: 3 mL via INTRAVENOUS

## 2012-11-10 MED ORDER — FAMOTIDINE IN NACL 20-0.9 MG/50ML-% IV SOLN
20.0000 mg | Freq: Once | INTRAVENOUS | Status: AC
  Administered 2012-11-10: 20 mg via INTRAVENOUS
  Filled 2012-11-10: qty 50

## 2012-11-10 MED ORDER — FENTANYL CITRATE 0.05 MG/ML IJ SOLN
INTRAMUSCULAR | Status: DC | PRN
  Administered 2012-11-10 (×3): 50 ug via INTRAVENOUS

## 2012-11-10 MED ORDER — OXYCODONE HCL 5 MG PO TABS
5.0000 mg | ORAL_TABLET | Freq: Once | ORAL | Status: DC | PRN
Start: 2012-11-10 — End: 2012-11-10

## 2012-11-10 MED ORDER — LACTATED RINGERS IV SOLN
INTRAVENOUS | Status: DC
  Administered 2012-11-10: 14:00:00 via INTRAVENOUS

## 2012-11-10 MED ORDER — PROMETHAZINE HCL 25 MG/ML IJ SOLN: 6.2500 mg | INTRAMUSCULAR | Status: DC | PRN

## 2012-11-10 MED ORDER — EPHEDRINE SULFATE 50 MG/ML IJ SOLN
INTRAMUSCULAR | Status: DC | PRN
  Administered 2012-11-10: 5 mg via INTRAVENOUS

## 2012-11-10 MED ORDER — ONDANSETRON HCL 4 MG/2ML IJ SOLN
INTRAMUSCULAR | Status: DC | PRN
  Administered 2012-11-10: 4 mg via INTRAVENOUS

## 2012-11-10 MED ORDER — FENTANYL CITRATE 0.05 MG/ML IJ SOLN
INTRAMUSCULAR | Status: AC
  Filled 2012-11-10: qty 2

## 2012-11-10 MED ORDER — ACETAMINOPHEN 325 MG PO TABS
650.0000 mg | ORAL_TABLET | ORAL | Status: DC | PRN
  Administered 2012-11-10 – 2012-11-11 (×2): 650 mg via ORAL
  Filled 2012-11-10 (×2): qty 2

## 2012-11-10 MED ORDER — PHENYLEPHRINE HCL 10 MG/ML IJ SOLN
INTRAMUSCULAR | Status: DC | PRN
  Administered 2012-11-10 (×2): 40 ug via INTRAVENOUS

## 2012-11-10 MED ORDER — SODIUM CHLORIDE 0.9 % IV SOLN: 250.0000 mL | INTRAVENOUS | Status: DC | PRN

## 2012-11-10 MED ORDER — CLINDAMYCIN PHOSPHATE 900 MG/50ML IV SOLN
900.0000 mg | Freq: Three times a day (TID) | INTRAVENOUS | Status: AC
  Administered 2012-11-10 – 2012-11-11 (×2): 900 mg via INTRAVENOUS
  Filled 2012-11-10 (×2): qty 50

## 2012-11-10 MED ORDER — MIDAZOLAM HCL 2 MG/2ML IJ SOLN
INTRAMUSCULAR | Status: AC
  Filled 2012-11-10: qty 2

## 2012-11-10 MED ORDER — DOCUSATE SODIUM 100 MG PO CAPS
100.0000 mg | ORAL_CAPSULE | Freq: Every day | ORAL | Status: DC
Start: 2012-11-11 — End: 2012-11-11
  Administered 2012-11-11: 100 mg via ORAL

## 2012-11-10 MED ORDER — ZOLPIDEM TARTRATE 5 MG PO TABS: 5.0000 mg | ORAL_TABLET | Freq: Every evening | ORAL | Status: DC | PRN

## 2012-11-10 MED ORDER — MIDAZOLAM HCL 2 MG/2ML IJ SOLN
INTRAMUSCULAR | Status: DC | PRN
  Administered 2012-11-10 (×2): 1 mg via INTRAVENOUS

## 2012-11-10 MED ORDER — LACTATED RINGERS IV SOLN
INTRAVENOUS | Status: DC | PRN
  Administered 2012-11-10 (×2): via INTRAVENOUS

## 2012-11-10 MED ORDER — GENTAMICIN SULFATE 40 MG/ML IJ SOLN
INTRAVENOUS | Status: AC
  Administered 2012-11-10: 100 mL via INTRAVENOUS
  Filled 2012-11-10: qty 9.75

## 2012-11-10 MED ORDER — SODIUM CHLORIDE 0.9 % IJ SOLN
3.0000 mL | Freq: Two times a day (BID) | INTRAMUSCULAR | Status: DC
  Administered 2012-11-10 – 2012-11-11 (×2): 3 mL via INTRAVENOUS

## 2012-11-10 MED ORDER — PHENYLEPHRINE 40 MCG/ML (10ML) SYRINGE FOR IV PUSH (FOR BLOOD PRESSURE SUPPORT)
PREFILLED_SYRINGE | INTRAVENOUS | Status: AC
  Filled 2012-11-10: qty 5

## 2012-11-10 MED ORDER — HYDROMORPHONE HCL PF 1 MG/ML IJ SOLN: 0.2500 mg | INTRAMUSCULAR | Status: DC | PRN

## 2012-11-10 MED ORDER — BUPIVACAINE IN DEXTROSE 0.75-8.25 % IT SOLN
INTRATHECAL | Status: DC | PRN
  Administered 2012-11-10: 1.5 mL via INTRATHECAL

## 2012-11-10 MED ORDER — CITRIC ACID-SODIUM CITRATE 334-500 MG/5ML PO SOLN
30.0000 mL | Freq: Once | ORAL | Status: AC
  Administered 2012-11-10: 30 mL via ORAL
  Filled 2012-11-10: qty 15

## 2012-11-10 MED ORDER — EPHEDRINE 5 MG/ML INJ
INTRAVENOUS | Status: AC
  Filled 2012-11-10: qty 10

## 2012-11-10 MED ORDER — MEPERIDINE HCL 25 MG/ML IJ SOLN: 6.2500 mg | INTRAMUSCULAR | Status: DC | PRN

## 2012-11-10 MED ORDER — PRENATAL MULTIVITAMIN CH
1.0000 | ORAL_TABLET | Freq: Every day | ORAL | Status: DC
  Administered 2012-11-11: 1 via ORAL
  Filled 2012-11-10: qty 1

## 2012-11-10 MED ORDER — ONDANSETRON HCL 4 MG/2ML IJ SOLN
INTRAMUSCULAR | Status: AC
  Filled 2012-11-10: qty 2

## 2012-11-10 MED ORDER — OXYCODONE HCL 5 MG/5ML PO SOLN: 5.0000 mg | Freq: Once | ORAL | Status: DC | PRN

## 2012-11-10 SURGICAL SUPPLY — 15 items
CATH ROBINSON RED A/P 16FR (CATHETERS) ×2 IMPLANT
CLOTH BEACON ORANGE TIMEOUT ST (SAFETY) ×2 IMPLANT
COUNTER NEEDLE 1200 MAGNETIC (NEEDLE) ×2 IMPLANT
GLOVE BIO SURGEON STRL SZ 6.5 (GLOVE) ×6 IMPLANT
GOWN STRL REIN XL XLG (GOWN DISPOSABLE) ×4 IMPLANT
NEEDLE MAYO .5 CIRCLE (NEEDLE) ×2 IMPLANT
PACK VAGINAL MINOR WOMEN LF (CUSTOM PROCEDURE TRAY) ×2 IMPLANT
PAD OB MATERNITY 4.3X12.25 (PERSONAL CARE ITEMS) ×2 IMPLANT
PAD PREP 24X48 CUFFED NSTRL (MISCELLANEOUS) ×2 IMPLANT
SUT POLYDEK 5 CE 75 36 (SUTURE) ×2 IMPLANT
SYR BULB IRRIGATION 50ML (SYRINGE) ×2 IMPLANT
TOWEL OR 17X24 6PK STRL BLUE (TOWEL DISPOSABLE) ×4 IMPLANT
TUBING NON-CON 1/4 X 20 CONN (TUBING) ×2 IMPLANT
WATER STERILE IRR 1000ML POUR (IV SOLUTION) ×2 IMPLANT
YANKAUER SUCT BULB TIP NO VENT (SUCTIONS) ×2 IMPLANT

## 2012-11-10 NOTE — MAU Provider Note (Signed)
  History     CSN: [id number]  Arrival date and time: 11/10/12 4696   First Provider Initiated Contact with Patient 11/10/12 1000      Chief Complaint  Patient presents with  . Vaginal Pain    Patient reports abdominal and vaginal pain when she uses the bathroom   HPI Erika Townsend 22 y.o. [redacted]w[redacted]d Was seen in the office yesterday with some lower abdominal pain.  Has continued to have bilateral lower abdominal pain.  Cervix in the office yesterday was 2.8 cm with no funneling.  Client has been scheduled to have a cerclage at 13 weeks but did not have it as she wants to be put to sleep for the cerclage and not have the "shot in my back".  Denies any leaking of fluid or vaginal bleeding.   OB History   Grav Para Term Preterm Abortions TAB SAB Ect Mult Living   2    1  1    0      Past Medical History  Diagnosis Date  . Anemia     Past Surgical History  Procedure Laterality Date  . Dilation and curettage of uterus  01/26/2012    following a miscarriage    Family History  Problem Relation Age of Onset  . Stroke Maternal Grandmother   . Heart disease Maternal Grandmother   . Cancer Maternal Grandfather     History  Substance Use Topics  . Smoking status: Never Smoker   . Smokeless tobacco: Never Used  . Alcohol Use: No    Allergies:  Allergies  Allergen Reactions  . Cinnamon Shortness Of Breath  . Penicillins Shortness Of Breath and Swelling    Prescriptions prior to admission  Medication Sig Dispense Refill  . Prenatal Vit-Fe Fumarate-FA (PRENATAL MULTIVITAMIN) TABS Take 1 tablet by mouth daily at 12 noon.        Review of Systems  Constitutional: Negative for fever.  Gastrointestinal: Positive for abdominal pain. Negative for nausea, vomiting, diarrhea and constipation.  Genitourinary: Positive for dysuria.       No vaginal bleeding. No leaking of fluid.   Physical Exam   Blood pressure 123/82, pulse 110, temperature 98.5 F (36.9 C), temperature source  Oral, resp. rate 18, height 5' 3.5" (1.613 m), weight 253 lb 4 oz (114.873 kg), last menstrual period 07/16/2012.  Physical Exam  Nursing note and vitals reviewed. Constitutional: She is oriented to person, place, and time. She appears well-developed and well-nourished. No distress.  HENT:  Head: Normocephalic.  Eyes: EOM are normal.  Neck: Neck supple.  Respiratory: Effort normal.  GI: Soft. There is tenderness. There is no rebound and no guarding.  Musculoskeletal: Normal range of motion.  Neurological: She is alert and oriented to person, place, and time.  Skin: Skin is warm and dry.  Psychiatric: She has a normal mood and affect.    [name]  Procedures Ultrasound for cervical length done and result - cervix is 0.8 cm with funneling of internal os noted.  Dr. Senaida Ores notified.  MDM 1020 Consult with Dr. Senaida Ores re: plan of care.  Will get limited ultrasound for cervical length.  Assessment and Plan  Cervical change at 16 weeks Probable UTI  Plan Admit to antenatal unit Dr. Senaida Ores will write orders and see patient there.  Erika Townsend 11/10/2012, 10:21 AM

## 2012-11-10 NOTE — Anesthesia Preprocedure Evaluation (Signed)
Anesthesia Evaluation  Patient identified by MRN, date of birth, ID band Patient awake    Reviewed: Allergy & Precautions, H&P , NPO status , Patient's Chart, lab work & pertinent test results  Airway Mallampati: II TM Distance: >3 FB Neck ROM: Full    Dental  (+) Dental Advisory Given   Pulmonary neg pulmonary ROS,  breath sounds clear to auscultation        Cardiovascular negative cardio ROS  Rhythm:Regular Rate:Normal     Neuro/Psych negative neurological ROS  negative psych ROS   GI/Hepatic negative GI ROS, Neg liver ROS,   Endo/Other  Morbid obesity  Renal/GU negative Renal ROS     Musculoskeletal negative musculoskeletal ROS (+)   Abdominal (+) + obese,   Peds  Hematology  (+) Blood dyscrasia, anemia ,   Anesthesia Other Findings   Reproductive/Obstetrics (+) Pregnancy                           Anesthesia Physical Anesthesia Plan  ASA: III  Anesthesia Plan: Spinal   Post-op Pain Management:    Induction:   Airway Management Planned:   Additional Equipment:   Intra-op Plan:   Post-operative Plan:   Informed Consent: I have reviewed the patients History and Physical, chart, labs and discussed the procedure including the risks, benefits and alternatives for the proposed anesthesia with the patient or authorized representative who has indicated his/her understanding and acceptance.   Dental advisory given  Plan Discussed with: CRNA  Anesthesia Plan Comments:         Anesthesia Quick Evaluation

## 2012-11-10 NOTE — Anesthesia Procedure Notes (Signed)
Spinal  Patient location during procedure: OR Start time: 11/10/2012 3:02 PM End time: 11/10/2012 3:06 PM Staffing Anesthesiologist: Lewie Loron R Performed by: anesthesiologist  Preanesthetic Checklist Completed: patient identified, site marked, surgical consent, pre-op evaluation, timeout performed, IV checked, risks and benefits discussed and monitors and equipment checked Spinal Block Patient position: sitting Prep: site prepped and draped and DuraPrep Patient monitoring: heart rate, continuous pulse ox and blood pressure Approach: midline Location: L3-4 Injection technique: single-shot Needle Needle type: Sprotte  Needle gauge: 24 G Needle length: 9 cm Assessment Sensory level: T6 Additional Notes Expiration date of kit checked and confirmed. Patient tolerated procedure well, without complications.

## 2012-11-10 NOTE — Anesthesia Postprocedure Evaluation (Signed)
Anesthesia Post Note  Patient: Erika Townsend  Procedure(s) Performed: Procedure(s) (LRB): CERCLAGE CERVICAL (N/A)  Anesthesia type: Spinal  Patient location: PACU  Post pain: Pain level controlled  Post assessment: Post-op Vital signs reviewed  Last Vitals: BP 107/64  Pulse 94  Temp(Src) 36.4 C (Oral)  Resp 21  Ht 5' 3.5" (1.613 m)  Wt 253 lb (114.76 kg)  BMI 44.11 kg/m2  SpO2 100%  LMP 07/16/2012  Post vital signs: Reviewed  Level of consciousness: sedated  Complications: No apparent anesthesia complications

## 2012-11-10 NOTE — MAU Note (Signed)
Patient reported she was seen in the office yesterday. Patient said her cervix was 2.8. Patient trying to decide if she wants a cerclage placed.

## 2012-11-10 NOTE — Op Note (Signed)
Operative note  Preop diagnosis Preterm pregnancy at 16 weeks and 5 days Incompetent cervix Cervical funneling and shortening on ultrasound  Postop diagnosis Same  Procedure McDonald cerclage x2 (not at 12:00 and 1:00)  Surgeon Dr. Huel Cote  Anesthesia Spinal  Fluids Estimated blood loss 50 cc Urine output 100 cc straight catheter prior procedure IV fluids approximately 1000 cc LR  Findings Cervix appeared closed however was somewhat short and appearing to measure approximately 1/2 cm in length.  Procedure Patient was taken to the operating room where spinal anesthesia was obtained without difficulty. She was prepped and draped in the normal sterile fashion in the dorsal lithotomy position. An appropriate time out was performed. A weighted speculum was then placed within the vagina and the cervix identified and examined with a closed appearance and somewhat shortened in nature. A McDonald pursestring cerclage was then performed and the counterclockwise circumferential fashion beginning at 12:00 and ending there with the knot tied in that location. The cervix appeared closed once this was tied down however one additional stitch was placed just behind that one for added support. This was also tied at 12 to 1:00. With no sutures in place the cervix appeared closed there is no active bleeding noted and there was no leakage of fluid noted. The speculum was were removed from the vagina and the patient was taken to the recovery room in good condition. Fetal heart tones were obtained and the recovery room and normal. Patient will be admitted for observation with a plan for discharge in 12-24 hours if she remains stable with no pain or leakage of fluid.

## 2012-11-10 NOTE — H&P (Signed)
Erika Townsend is a 22 y.o. female G2P010 at 52 5/7 weeks (EDD  04/22/13 by LMP c/w 8 week Erika Townsend) presenting for c/o increased pelvic pressure and discomfort.  Prenatal care complicated by h/o possible incompetent cervix with a 17 week loss in 1/14.  Pt reports she had some LOF and was seen at hospital and d/c home at around 17 weeks.  She then returned the next day with increased pressure and found to be 5cm dilated with baby foot in vagina.  She was admitted with expectant management and went on to deliver the baby several hours later.  I do not have the pathology report on the baby but the placenta showed mild chorioamnionitis and she did spike a temperature postpartum requiring a D&C for the placenta.  She came to our practice at 13 weeks and was counseled about a possible incompetent cervix and a cerclage was recommended and scheduled.  She then canceled the cerclage and stated she did not wish to proceed.  An Erika Townsend was performed 11/09/12 and showed the cervix to be 2.85 cm.  She returned to hospital with discomfort today and cervix re-measured and 0.8 cm with funneling noted.    Maternal Medical History:  Prenatal complications: Nephrolithiasis.     OB History   Grav Para Term Preterm Abortions TAB SAB Ect Mult Living   2    1  1    0    01/2012 17 week loss with D&C of placenta  Past Medical History  Diagnosis Date  . Anemia    Past Surgical History  Procedure Laterality Date  . Dilation and curettage of uterus  01/26/2012    following a miscarriage   Family History: family history includes Cancer in her maternal grandfather; Heart disease in her maternal grandmother; Stroke in her maternal grandmother. Social History:  reports that she has never smoked. She has never used smokeless tobacco. She reports that she does not drink alcohol or use illicit drugs.   Prenatal Transfer Tool  Maternal Diabetes: No Genetic Screening: Normal First trimester screen Maternal Ultrasounds/Referrals:  Abnormal:  Findings:   Other:shortened cervix Fetal Ultrasounds or other Referrals:  None Maternal Substance Abuse:  No Significant Maternal Medications:  None Significant Maternal Lab Results:  None Other Comments:  None  ROS    Blood pressure 123/82, pulse 110, temperature 98.5 F (36.9 C), temperature source Oral, resp. rate 18, height 5' 3.5" (1.613 m), weight 114.873 kg (253 lb 4 oz), last menstrual period 07/16/2012. Exam Physical Exam  Prenatal labs: ABO, Rh: --/--/O POS (08/28 1140) Antibody:   Negative  Rubella:  Immune RPR:   NR HBsAg:   Neg HIV:   NR GBS:   unknown   Assessment/Plan: Pt counseled again re: risks and benefits of cerclage including possible SROM and loss of baby.  Pt understands that with her cervical change she would,likely lose the pregnancy if the cerclage not attempted.  She has not eaten all day and we will proceed when OR available.   Erika Townsend 11/10/2012, 1:11 PM

## 2012-11-10 NOTE — Transfer of Care (Signed)
Immediate Anesthesia Transfer of Care Note  Patient: Erika Townsend  Procedure(s) Performed: Procedure(s): CERCLAGE CERVICAL (N/A)  Patient Location: PACU  Anesthesia Type:Spinal  Level of Consciousness: awake, alert  and oriented  Airway & Oxygen Therapy: Patient Spontanous Breathing  Post-op Assessment: Report given to PACU RN and Post -op Vital signs reviewed and stable  Post vital signs: Reviewed and stable  Complications: No apparent anesthesia complications

## 2012-11-10 NOTE — MAU Note (Signed)
Patient reports abdominal and vaginal pain when she uses the bathroom. She reports pain as a 10.

## 2012-11-11 NOTE — Progress Notes (Signed)
Patient ID: Erika Townsend, female   DOB: [birth year], 22 y.o.   MRN: [id number] Pt s/p cerclage x 2 for funneling and had some mild cramping, but no LOF or other issues.  Mild spotting only Voiding fine  afeb VSS FHT's good q shift   Will d/c home with pelvic rest and modified bedrest. She will take her classes from home online.

## 2012-11-11 NOTE — Progress Notes (Signed)
Utilization Review completed.  

## 2012-11-11 NOTE — Discharge Summary (Signed)
Physician Discharge Summary  Patient ID: Erika Townsend MRN: [id number] DOB/AGE: [birth year] 22 y.o.  Admit date: 11/10/2012 Discharge date: 11/11/2012  Admission Diagnoses: Incompetent Cervix                                         Intrauterine gestation at 16 5/7 weeks  Discharge Diagnoses: same  Active Problems:   Incompetent cervix   McDonald cerclage present   Discharged Condition: good  Hospital Course: Pt admitted  s/p cerclage for incompetent cervix with funneling down to 0.8 cm.  McDonald cerclage placed x 2 and pt admitted to overnight observation.  She did well with minimal cramping post cerclage and was d/c to home on bedrest and pelvic rest.  She will f/u in office in 2 weeks for anatomy US and cervical length.  Significant Diagnostic Studies: ultrasound  Treatments: Cerclage}  Discharge Exam: Blood pressure 109/48, pulse 94, temperature 98.3 F (36.8 C), temperature source Oral, resp. rate 18, height 5' 3.5" (1.613 m), weight 114.76 kg (253 lb), last menstrual period 07/16/2012, SpO2 100.00%. General appearance: alert and cooperative GI: soft NT  Disposition: 01-Home or Self Care     Medication List    ASK your doctor about these medications       prenatal multivitamin Tabs tablet  Take 1 tablet by mouth daily at 12 noon.         SignedOliver Pila 11/11/2012, 9:56 AM

## 2012-11-11 NOTE — Progress Notes (Signed)
4540 Assumed care for pt.

## 2012-11-12 ENCOUNTER — Encounter (HOSPITAL_COMMUNITY): Payer: Self-pay | Admitting: Obstetrics and Gynecology

## 2012-11-15 ENCOUNTER — Encounter (HOSPITAL_COMMUNITY): Payer: Self-pay

## 2012-11-15 ENCOUNTER — Other Ambulatory Visit: Payer: Self-pay

## 2012-11-15 ENCOUNTER — Inpatient Hospital Stay (HOSPITAL_COMMUNITY)
Admission: AD | Admit: 2012-11-15 | Discharge: 2012-11-15 | Disposition: A | Discharge status: Home / Self Care | Payer: BC Managed Care – PPO | Source: Ambulatory Visit | Attending: Obstetrics and Gynecology | Admitting: Obstetrics and Gynecology

## 2012-11-15 ENCOUNTER — Inpatient Hospital Stay (HOSPITAL_COMMUNITY): Payer: BC Managed Care – PPO

## 2012-11-15 DIAGNOSIS — R109 Unspecified abdominal pain: Secondary | ICD-10-CM | POA: Insufficient documentation

## 2012-11-15 DIAGNOSIS — O3432 Maternal care for cervical incompetence, second trimester: Secondary | ICD-10-CM

## 2012-11-15 DIAGNOSIS — O3430 Maternal care for cervical incompetence, unspecified trimester: Secondary | ICD-10-CM | POA: Insufficient documentation

## 2012-11-15 DIAGNOSIS — O3440 Maternal care for other abnormalities of cervix, unspecified trimester: Secondary | ICD-10-CM

## 2012-11-15 LAB — WET PREP, GENITAL: Yeast Wet Prep HPF POC: NONE SEEN

## 2012-11-15 MED ORDER — DOCUSATE SODIUM 100 MG PO CAPS: 100.0000 mg | ORAL_CAPSULE | Freq: Two times a day (BID) | ORAL | Status: DC

## 2012-11-15 NOTE — MAU Provider Note (Signed)
Chief Complaint: Abdominal Pain   First Provider Initiated Contact with Patient 11/15/12 1853     SUBJECTIVE HPI: Frankie Zito is a 22 y.o. G2P0010 at [redacted]w[redacted]d by LMP who presents to maternity admissions via EMS reporting pelvic pressure when having a bowel movement at home.  She did not actually have bowel movement today because she stopped and called EMS.  She has hx of 1 previous delivery at 17 weeks and cerclage placed this pregnancy on 11/10/12.  Pt declined prophylactic cerclage at 13 weeks but came to MAU with shortened cervix at 16 weeks and cerclage was placed.  She reports no pelvic pressure when lying down but does have mild pressure when standing. She denies vaginal bleeding, vaginal itching/burning, urinary symptoms, h/a, dizziness, n/v, or fever/chills.      Past Medical History  Diagnosis Date  . Anemia    Past Surgical History  Procedure Laterality Date  . Dilation and curettage of uterus  01/26/2012    following a miscarriage  . Cervical cerclage N/A 11/10/2012    Procedure: CERCLAGE CERVICAL;  Surgeon: Oliver Pila, MD;  Location: [facility];  Service: Gynecology;  Laterality: N/A;   History   Social History  . Marital Status: Single    Spouse Name: N/A    Number of Children: N/A  . Years of Education: N/A   Occupational History  . Not on file.   Social History Main Topics  . Smoking status: Never Smoker   . Smokeless tobacco: Never Used  . Alcohol Use: No  . Drug Use: No  . Sexual Activity: Yes    Birth Control/ Protection: None     Comment: Last had sex 09/15/12   Other Topics Concern  . Not on file   Social History Narrative  . No narrative on file   No current facility-administered medications on file prior to encounter.   Current Outpatient Prescriptions on File Prior to Encounter  Medication Sig Dispense Refill  . Prenatal Vit-Fe Fumarate-FA (PRENATAL MULTIVITAMIN) TABS Take 1 tablet by mouth daily at 12 noon.       Allergies  Allergen  Reactions  . Cinnamon Shortness Of Breath  . Penicillins Shortness Of Breath and Swelling    ROS: Pertinent items in HPI  OBJECTIVE Blood pressure 109/52, pulse 100, temperature 98.2 F (36.8 C), temperature source Oral, resp. rate 18, height 5' 3.5" (1.613 m), weight 114.902 kg (253 lb 5 oz), last menstrual period 07/16/2012. GENERAL: Well-developed, well-nourished female in no acute distress.  HEENT: Normocephalic HEART: normal rate RESP: normal effort ABDOMEN: Soft, non-tender EXTREMITIES: Nontender, no edema NEURO: Alert and oriented SPECULUM EXAM: Cerclage visible, cervix appears to be closed, no noticeable length visually, moderate amount white thin discharge at introitus prior to exam  LAB RESULTS Results for orders placed during the hospital encounter of 11/15/12 (from the past 24 hour(s))  WET PREP, GENITAL     Status: Abnormal   Collection Time    11/15/12  7:25 PM      Result Value Range   Yeast Wet Prep HPF POC NONE SEEN  NONE SEEN   Trich, Wet Prep NONE SEEN  NONE SEEN   Clue Cells Wet Prep HPF POC NONE SEEN  NONE SEEN   WBC, Wet Prep HPF POC FEW (*) NONE SEEN    IMAGING   US Ob Limited  11/15/2012   OBSTETRICAL ULTRASOUND: This exam was performed within a [facility]. The OB US report was generated in the AS system, and  faxed to the ordering physician.   This report is also available in [employer] and in the [employer]. See AS Obstetric US report.  US Ob Transvaginal  11/15/2012   OBSTETRICAL ULTRASOUND: This exam was performed within a [facility]. The OB US report was generated in the AS system, and faxed to the ordering physician.   This report is also available in [employer] and in the [employer]. See AS Obstetric US report.  Cervical length 1.2 cm, with cerclage intact.  A: S/P cerclage with pelvic pressure  P: 1. Cervical insufficiency in pregnancy in second  trimester, antepartum     Consult Dr Senaida Ores 2154: Discussed ultrasound with Dr. Senaida Ores, no significant changes. Will have her continue bed rest at home. Colace daily and milk of mag as needed.   Report to Thressa Sheller, CNM Holly [facility]ily [facility] Certified Nurse-Midwife 11/15/2012  6:54 PM

## 2012-11-15 NOTE — MAU Note (Signed)
Pt reports she went to the bathroom to have a bowel movement and felt a lot of pressure so she stopped. Pt had cerclage put in Saturday November 1st for an incompetent cervix. Pt states a pain comes and goes in lower pelvic area and rates it as an 8.

## 2012-11-17 ENCOUNTER — Encounter (HOSPITAL_COMMUNITY): Payer: Self-pay | Admitting: *Deleted

## 2012-11-17 ENCOUNTER — Inpatient Hospital Stay (HOSPITAL_COMMUNITY)
Admission: AD | Admit: 2012-11-17 | Discharge: 2012-11-18 | Disposition: A | Discharge status: Home / Self Care | Payer: BC Managed Care – PPO | Source: Ambulatory Visit | Attending: Obstetrics and Gynecology | Admitting: Obstetrics and Gynecology

## 2012-11-17 DIAGNOSIS — O209 Hemorrhage in early pregnancy, unspecified: Secondary | ICD-10-CM | POA: Insufficient documentation

## 2012-11-17 DIAGNOSIS — N883 Incompetence of cervix uteri: Secondary | ICD-10-CM

## 2012-11-17 DIAGNOSIS — O3430 Maternal care for cervical incompetence, unspecified trimester: Secondary | ICD-10-CM | POA: Insufficient documentation

## 2012-11-17 DIAGNOSIS — O4690 Antepartum hemorrhage, unspecified, unspecified trimester: Secondary | ICD-10-CM

## 2012-11-17 DIAGNOSIS — O4692 Antepartum hemorrhage, unspecified, second trimester: Secondary | ICD-10-CM

## 2012-11-17 NOTE — MAU Note (Signed)
Pt G2 P0 at 17.5wks with cerclage placed 1 wk ago.  Having small amt of bright red bleeding, no pain.

## 2012-11-17 NOTE — MAU Provider Note (Signed)
  History     CSN: [id number]  Arrival date and time: 11/17/12 2305   First Provider Initiated Contact with Patient 11/17/12 2354      Chief Complaint  Patient presents with  . Vaginal Bleeding   HPI pt is [redacted]w[redacted]d pregnant and started bleeding about 1 1/2 hours ago, noticed when she went to the bathroom. Pt had a bowel movement this morning, which was normal and no bleeding. Pt denies pain but has felt her abdomen tighten up This evening.  Pt has been home on bedrest.  Pt G2 P0 at 17.5wks with cerclage placed 1 wk ago. Having small amt of bright red bleeding, no pain.       Past Medical History  Diagnosis Date  . Anemia     Past Surgical History  Procedure Laterality Date  . Dilation and curettage of uterus  01/26/2012    following a miscarriage  . Cervical cerclage N/A 11/10/2012    Procedure: CERCLAGE CERVICAL;  Surgeon: Oliver Pila, MD;  Location: [facility];  Service: Gynecology;  Laterality: N/A;    Family History  Problem Relation Age of Onset  . Stroke Maternal Grandmother   . Heart disease Maternal Grandmother   . Cancer Maternal Grandfather     History  Substance Use Topics  . Smoking status: Never Smoker   . Smokeless tobacco: Never Used  . Alcohol Use: No    Allergies:  Allergies  Allergen Reactions  . Cinnamon Shortness Of Breath  . Penicillins Shortness Of Breath and Swelling    Prescriptions prior to admission  Medication Sig Dispense Refill  . Prenatal Vit-Fe Fumarate-FA (PRENATAL MULTIVITAMIN) TABS Take 1 tablet by mouth daily at 12 noon.      . docusate sodium (COLACE) 100 MG capsule Take 1 capsule (100 mg total) by mouth 2 (two) times daily.  60 capsule  1    Review of Systems  Constitutional: Negative for fever and chills.  Gastrointestinal: Negative for nausea, vomiting, abdominal pain, diarrhea and constipation.  Genitourinary: Negative for dysuria.   Physical Exam   Blood pressure 107/55, pulse 107, temperature 98.4 F (36.9  C), temperature source Oral, resp. rate 18, height 5\' 3"  (1.6 m), weight 115.032 kg (253 lb 9.6 oz), last menstrual period 07/16/2012.  Physical Exam  Nursing note and vitals reviewed. Constitutional: She is oriented to person, place, and time. She appears well-developed and well-nourished. No distress.  Cardiovascular: Normal rate.   Respiratory: Effort normal.  GI: Soft. She exhibits no distension. There is no tenderness. There is no rebound.  Genitourinary:  Mod amount of dark red blood invault; cervix appears to be closed with cerclage with speculum exam; no active bleeding noted FHT 162 bpm  Musculoskeletal: Normal range of motion.  Neurological: She is alert and oriented to person, place, and time.  Skin: Skin is warm and dry.  Psychiatric: She has a normal mood and affect.    MAU Course  Procedures  Discussed with Dr. Ambrose Mantle- will send home with continued bedrest   Assessment and Plan  Incompetent cervix with cerclage Home on bedrest  Erika Townsend 11/17/2012, 11:56 PM

## 2012-11-30 ENCOUNTER — Observation Stay (HOSPITAL_COMMUNITY): Payer: BC Managed Care – PPO

## 2012-11-30 ENCOUNTER — Inpatient Hospital Stay (HOSPITAL_COMMUNITY)
Admit: 2012-11-30 | Discharge: 2012-11-30 | Disposition: A | Discharge status: Home / Self Care | Payer: BC Managed Care – PPO | Attending: Obstetrics and Gynecology | Admitting: Obstetrics and Gynecology

## 2012-11-30 ENCOUNTER — Encounter (HOSPITAL_COMMUNITY): Payer: Self-pay | Admitting: *Deleted

## 2012-11-30 ENCOUNTER — Inpatient Hospital Stay (HOSPITAL_COMMUNITY)
Admission: AD | Admit: 2012-11-30 | Discharge: 2012-12-02 | DRG: 767 | Disposition: A | Discharge status: Home / Self Care | Payer: BC Managed Care – PPO | Source: Ambulatory Visit | Attending: Obstetrics and Gynecology | Admitting: Obstetrics and Gynecology

## 2012-11-30 DIAGNOSIS — O3430 Maternal care for cervical incompetence, unspecified trimester: Secondary | ICD-10-CM | POA: Diagnosis present

## 2012-11-30 DIAGNOSIS — N883 Incompetence of cervix uteri: Secondary | ICD-10-CM

## 2012-11-30 DIAGNOSIS — Z880 Allergy status to penicillin: Secondary | ICD-10-CM

## 2012-11-30 DIAGNOSIS — O4290 Premature rupture of membranes, unspecified as to length of time between rupture and onset of labor, unspecified weeks of gestation: Principal | ICD-10-CM | POA: Diagnosis present

## 2012-11-30 DIAGNOSIS — Z9889 Other specified postprocedural states: Secondary | ICD-10-CM

## 2012-11-30 LAB — URINALYSIS, ROUTINE W REFLEX MICROSCOPIC
Bilirubin Urine: NEGATIVE
Glucose, UA: NEGATIVE mg/dL
Ketones, ur: 40 mg/dL — AB
Specific Gravity, Urine: >1.03 — ABNORMAL HIGH (ref 1.005–1.030)
Urobilinogen, UA: 0.2 mg/dL (ref 0.0–1.0)
pH: 6 (ref 5.0–8.0)

## 2012-11-30 LAB — URINE MICROSCOPIC-ADD ON

## 2012-11-30 LAB — RPR: RPR Ser Ql: NONREACTIVE

## 2012-11-30 LAB — CBC
HCT: 34.2 % — ABNORMAL LOW (ref 36.0–46.0)
MCHC: 34.5 g/dL (ref 30.0–36.0)
Platelets: 202 10*3/uL (ref 150–400)
RDW: 13.5 % (ref 11.5–15.5)
WBC: 25.3 10*3/uL — ABNORMAL HIGH (ref 4.0–10.5)

## 2012-11-30 LAB — TYPE AND SCREEN: Antibody Screen: NEGATIVE

## 2012-11-30 LAB — AMNISURE RUPTURE OF MEMBRANE (ROM) NOT AT ARMC: Amnisure ROM: POSITIVE

## 2012-11-30 MED ORDER — ACETAMINOPHEN 325 MG PO TABS: 650.0000 mg | ORAL_TABLET | ORAL | Status: DC | PRN

## 2012-11-30 MED ORDER — LACTATED RINGERS IV SOLN: 500.0000 mL | INTRAVENOUS | Status: DC | PRN

## 2012-11-30 MED ORDER — GENTAMICIN SULFATE 40 MG/ML IJ SOLN
150.0000 mg | Freq: Once | INTRAVENOUS | Status: AC
  Administered 2012-11-30: 150 mg via INTRAVENOUS
  Filled 2012-11-30: qty 3.75

## 2012-11-30 MED ORDER — LIDOCAINE HCL (PF) 1 % IJ SOLN
30.0000 mL | INTRAMUSCULAR | Status: DC | PRN
  Filled 2012-11-30: qty 30

## 2012-11-30 MED ORDER — LACTATED RINGERS IV SOLN
INTRAVENOUS | Status: DC
  Administered 2012-12-01: 02:00:00 via INTRAVENOUS

## 2012-11-30 MED ORDER — BUTORPHANOL TARTRATE 1 MG/ML IJ SOLN
1.0000 mg | INTRAMUSCULAR | Status: DC | PRN
  Administered 2012-11-30 (×3): 2 mg via INTRAVENOUS
  Filled 2012-11-30 (×2): qty 2

## 2012-11-30 MED ORDER — GENTAMICIN SULFATE 40 MG/ML IJ SOLN
Freq: Three times a day (TID) | INTRAVENOUS | Status: DC
  Administered 2012-12-01: 04:00:00 via INTRAVENOUS
  Filled 2012-11-30 (×2): qty 3.75

## 2012-11-30 MED ORDER — SODIUM CHLORIDE 0.9 % IV SOLN: 2.0000 g | Freq: Four times a day (QID) | INTRAVENOUS | Status: DC

## 2012-11-30 MED ORDER — CLINDAMYCIN PHOSPHATE 900 MG/50ML IV SOLN
900.0000 mg | Freq: Three times a day (TID) | INTRAVENOUS | Status: DC
  Administered 2012-11-30: 900 mg via INTRAVENOUS
  Filled 2012-11-30 (×2): qty 50

## 2012-11-30 MED ORDER — OXYCODONE-ACETAMINOPHEN 5-325 MG PO TABS: 1.0000 | ORAL_TABLET | ORAL | Status: DC | PRN

## 2012-11-30 MED ORDER — ONDANSETRON HCL 4 MG/2ML IJ SOLN
4.0000 mg | Freq: Four times a day (QID) | INTRAMUSCULAR | Status: DC | PRN
  Administered 2012-11-30: 4 mg via INTRAVENOUS
  Filled 2012-11-30: qty 2

## 2012-11-30 MED ORDER — IBUPROFEN 600 MG PO TABS: 600.0000 mg | ORAL_TABLET | Freq: Four times a day (QID) | ORAL | Status: DC | PRN

## 2012-11-30 MED ORDER — OXYTOCIN 40 UNITS IN LACTATED RINGERS INFUSION - SIMPLE MED
62.5000 mL/h | INTRAVENOUS | Status: DC
  Administered 2012-12-01: 40 [IU] via INTRAVENOUS
  Filled 2012-11-30: qty 1000

## 2012-11-30 MED ORDER — OXYTOCIN BOLUS FROM INFUSION
500.0000 mL | INTRAVENOUS | Status: DC
  Administered 2012-12-01: 500 mL via INTRAVENOUS

## 2012-11-30 MED ORDER — LACTATED RINGERS IV SOLN
INTRAVENOUS | Status: DC
  Administered 2012-11-30: 17:00:00 via INTRAVENOUS

## 2012-11-30 MED ORDER — BUTORPHANOL TARTRATE 1 MG/ML IJ SOLN
2.0000 mg | INTRAMUSCULAR | Status: DC | PRN
  Administered 2012-11-30: 2 mg via INTRAVENOUS
  Filled 2012-11-30 (×2): qty 2

## 2012-11-30 MED ORDER — CITRIC ACID-SODIUM CITRATE 334-500 MG/5ML PO SOLN
30.0000 mL | ORAL | Status: DC | PRN
  Administered 2012-12-01: 30 mL via ORAL
  Filled 2012-11-30: qty 15

## 2012-11-30 NOTE — Progress Notes (Signed)
Patient ID: Erika Townsend, female   DOB: Sep 28, 1990, 22 y.o.   MRN: [id number]  Pt with ctx, d/w pt and family prognosis and situation with ROM at 19wk and laboring.  Voice understanding, questions answered.  AFVSS gen NAD, uncomf SSE - feet in vagina Cerclage clipped x 2 SVE 4.5/90/0-+1  Will continue gent/clinda Anticipate SVD, soon

## 2012-11-30 NOTE — MAU Note (Signed)
Patient states she had a cervical cerclage placed on 11-1. States she started having abdominal pain and a yellow vaginal discharge last night.

## 2012-11-30 NOTE — MAU Provider Note (Signed)
Chief Complaint: Abdominal Pain and Vaginal Discharge   First Provider Initiated Contact with Patient 11/30/12 1149     SUBJECTIVE HPI: Erika Townsend is a 22 y.o. G2P0010 at [redacted]w[redacted]d by LMP who presents to maternity admissions reporting yellow vaginal discharge and pelvic pressure since this morning.  She reports she has not had a bowel movement x1 week.  She has a cerclage in place and has hx of delivery at 17 weeks with previous pregnancy.  She denies vaginal bleeding, vaginal itching/burning, urinary symptoms, h/a, dizziness, n/v, or fever/chills.   Past Medical History  Diagnosis Date  . Anemia    Past Surgical History  Procedure Laterality Date  . Dilation and curettage of uterus  01/26/2012    following a miscarriage  . Cervical cerclage N/A 11/10/2012    Procedure: CERCLAGE CERVICAL;  Surgeon: Oliver Pila, MD;  Location: [facility];  Service: Gynecology;  Laterality: N/A;   History   Social History  . Marital Status: Single    Spouse Name: N/A    Number of Children: N/A  . Years of Education: N/A   Occupational History  . Not on file.   Social History Main Topics  . Smoking status: Never Smoker   . Smokeless tobacco: Never Used  . Alcohol Use: No  . Drug Use: No  . Sexual Activity: Not Currently    Birth Control/ Protection: None     Comment: last intercourse 09/15/2012   Other Topics Concern  . Not on file   Social History Narrative  . No narrative on file   No current facility-administered medications on file prior to encounter.   Current Outpatient Prescriptions on File Prior to Encounter  Medication Sig Dispense Refill  . Prenatal Vit-Fe Fumarate-FA (PRENATAL MULTIVITAMIN) TABS Take 1 tablet by mouth daily at 12 noon.       Allergies  Allergen Reactions  . Cinnamon Shortness Of Breath  . Penicillins Shortness Of Breath and Swelling    ROS: Pertinent items in HPI  OBJECTIVE Blood pressure 93/75, pulse 125, temperature 99.1 F (37.3 C), temperature  source Oral, resp. rate 20, last menstrual period 07/16/2012, SpO2 100.00%. GENERAL: Well-developed, well-nourished female in no acute distress.  HEENT: Normocephalic HEART: normal rate RESP: normal effort ABDOMEN: Soft, non-tender EXTREMITIES: Nontender, no edema NEURO: Alert and oriented  Speculum exam: Pt noted to have clear/yellow fluid leaking onto bed prior to exam.  Gentle speculum exam, with positive pooling of fluid, cerclage visible, cervix visually 1 cm with cerclage stitch stretched over open os, no bleeding noted.    LAB RESULTS Results for orders placed during the hospital encounter of 11/30/12 (from the past 24 hour(s))  URINALYSIS, ROUTINE W REFLEX MICROSCOPIC     Status: Abnormal   Collection Time    11/30/12 10:48 AM      Result Value Range   Color, Urine YELLOW  YELLOW   APPearance HAZY (*) CLEAR   Specific Gravity, Urine >1.030 (*) 1.005 - 1.030   pH 6.0  5.0 - 8.0   Glucose, UA NEGATIVE  NEGATIVE mg/dL   Hgb urine dipstick MODERATE (*) NEGATIVE   Bilirubin Urine NEGATIVE  NEGATIVE   Ketones, ur 40 (*) NEGATIVE mg/dL   Protein, ur NEGATIVE  NEGATIVE mg/dL   Urobilinogen, UA 0.2  0.0 - 1.0 mg/dL   Nitrite NEGATIVE  NEGATIVE   Leukocytes, UA MODERATE (*) NEGATIVE  URINE MICROSCOPIC-ADD ON     Status: Abnormal   Collection Time    11/30/12 10:48 AM  Result Value Range   Squamous Epithelial / LPF FEW (*) RARE   WBC, UA TOO NUMEROUS TO COUNT  <3 WBC/hpf   RBC / HPF 3-6  <3 RBC/hpf   Bacteria, UA RARE  RARE   Urine-Other MUCOUS PRESENT    AMNISURE RUPTURE OF MEMBRANE (ROM)     Status: None   Collection Time    11/30/12 12:10 PM      Result Value Range   Amnisure ROM POSITIVE       ASSESSMENT PPROM @ [redacted]w[redacted]d Cerclage in place   PLAN Consult Dr Ellyn Hack Dr Ellyn Hack to room to see pt MFM Consult Limited US      Medication List    ASK your doctor about these medications       prenatal multivitamin Tabs tablet  Take 1 tablet by mouth daily at 12  noon.         Sharen Counter Certified Nurse-Midwife 11/30/2012  2:01 PM

## 2012-11-30 NOTE — Progress Notes (Signed)
ANTIBIOTIC CONSULT NOTE - INITIAL  Pharmacy Consult for Gentamicin Indication: PPROM  Allergies  Allergen Reactions  . Cinnamon Shortness Of Breath  . Penicillins Shortness Of Breath and Swelling    Patient Measurements: Height: 5\' 3"  (160 cm) Weight: 254 lb (115.214 kg) IBW/kg (Calculated) : 52.4 Adjusted Body Weight: 71.2kg  Vital Signs: Temp: 100.3 F (37.9 C) (11/21 1950) Temp src: Oral (11/21 1950) BP: 118/43 mmHg (11/21 1950) Pulse Rate: 117 (11/21 1950) I    Labs:  Recent Labs  11/30/12 1730  WBC 25.3*  HGB 11.8*  PLT 202   Estimated Creatinine Clearance: 133.8 ml/min (by C-G formula based on Cr of 0.75).   Microbiology: Recent Results (from the past 720 hour(s))  WET PREP, GENITAL     Status: Abnormal   Collection Time    11/15/12  7:25 PM      Result Value Range Status   Yeast Wet Prep HPF POC NONE SEEN  NONE SEEN Final   Trich, Wet Prep NONE SEEN  NONE SEEN Final   Clue Cells Wet Prep HPF POC NONE SEEN  NONE SEEN Final   WBC, Wet Prep HPF POC FEW (*) NONE SEEN Final   Comment: FEW BACTERIA SEEN    Medical History: Past Medical History  Diagnosis Date  . Anemia     Medications:  Clindamycin 900mg  IV q8h Assessment: 22yo F admitted with PPROM and laboring at 19+ weeks. Pt's cerclage has been clipped and antibiotics initiated for PPROM.  Goal of Therapy:  Gentamicin peak 6-10mcg/ml and trough < 62mcg/ml  Plan:  1. Gentamicin 150mg  IV x 1. Pt has already received one dose of Clindamycin 900mg  at 1730. Will combine Gentamicin and Clindamycin in next dose. 2. Will continue to follow and check SCr in am. Will assess need for kinetic levels based on duration of therapy. Thanks!  Claybon Jabs 11/30/2012,8:09 PM

## 2012-11-30 NOTE — H&P (Addendum)
Erika Townsend is a 22 y.o. female initially presenting to MAU with discharge, increased and ROM confirmed by [facility].  Pt with h/o 17wk . McDonald cerclage placed at 16 wk x 2 knot at 12 o'clock.  At last check funnelling down to cerclage.  CL = 2.8 initially, shortened to .8cm.   Nl anat, ant plac. 19wk IUP.  [place] in MFM, confirms increased dilitation, ? Cerclage pulled thru.     Maternal Medical History:  Reason for admission: Rupture of membranes.   Contractions: Onset was 3-5 hours ago.   Frequency: irregular.   Perceived severity is moderate.      OB History   Grav Para Term Preterm Abortions TAB SAB Ect Mult Living   2    1  1    0    G1 17 wk D&E at [facility] G2 present No abn pap, no STD  Past Medical History  Diagnosis Date  . Anemia   allergies, seasonal  Past Surgical History  Procedure Laterality Date  . Dilation and curettage of uterus  01/26/2012    following a miscarriage  . Cervical cerclage N/A 11/10/2012    Procedure: CERCLAGE CERVICAL;  Surgeon: Erika Pila, MD;  Location: [facility];  Service: Gynecology;  Laterality: N/A;   Family History: family history includes Cancer in her maternal grandfather; Heart disease in her maternal grandmother; Stroke in her maternal grandmother. Social History:  reports that she has never smoked. She has never used smokeless tobacco. She reports that she does not drink alcohol or use illicit drugs.single Meds PNV All PCN, Cinnamon   Prenatal Transfer Tool  Review of Systems  Constitutional: Negative.   HENT: Negative.   Eyes: Negative.   Respiratory: Negative.   Cardiovascular: Negative.   Gastrointestinal: Negative.   Genitourinary: Negative.   Musculoskeletal: Negative.   Skin: Negative.   Neurological: Negative.   Psychiatric/Behavioral: Negative.       Blood pressure 86/40, pulse 115, temperature 99.7 F (37.6 C), temperature source Oral, resp. rate 20, height 5\' 3"  (1.6 m), weight 115.214 kg (254 lb), last  menstrual period 07/16/2012, SpO2 100.00%. Maternal Exam:  Abdomen: Fundal height is appropriate for gestation.   Fetal presentation: breech  Introitus: Normal vulva. Normal vagina.  Pelvis: adequate for delivery.   Cervix: Cervix evaluated by sterile speculum exam.     Physical Exam  Constitutional: She is oriented to person, place, and time. She appears well-developed and well-nourished. She appears distressed.  HENT:  Head: Normocephalic and atraumatic.  Cardiovascular: Normal rate and regular rhythm.   Respiratory: Effort normal and breath sounds normal. No respiratory distress. She has no wheezes.  GI: Soft. Bowel sounds are normal. She exhibits no distension. There is no tenderness.  Musculoskeletal: Normal range of motion.  Neurological: She is alert and oriented to person, place, and time.  Skin: Skin is warm and dry.  Psychiatric: She has a normal mood and affect. Her behavior is normal.    Prenatal labs: ABO, Rh: --/--/O POS (11/01 1340) Antibody: NEG (11/01 1340) Rubella:  immune RPR:   NR HBsAg:   neg HIV:   neg GBS:   unknown  First Tri and AFP WNL/ Hgb 13.3/ Plt 281K/ Hgb electro WNL/ GC neg/ Chl neg  [place] nl anat, ant plac  Assessment/Plan: 22yo G2P0010 at 19+ with cervical incompetence, PPROM, now laboring, will clip cerclage and allow to labor, given Clindamycin   Erika Townsend 11/30/2012, 5:20 PM

## 2012-11-30 NOTE — Progress Notes (Signed)
Patient in MFM awaiting transfer.  Patient's FOB comes out to state that patient is "hurting" and "feeling dizzy".  Patient having sharp pains in vagina. No bleeding or fetal parts noted on perineum.  BP 95/46, 112. Dr. Claudean Severance at bedside. Attempting IV without success x 2. Patient states anesthesia usually has to start IV.   1650 patient transferred via bed to room 161.

## 2012-11-30 NOTE — Consult Note (Signed)
Maternal Fetal Medicine Consultation  Requesting Provider(s): Jody [name],MD  Reason for consultation: PROM at 19+ weeks  HPI: Erika Townsend is a 22 yo G2P0010 currently at 40 4/7 weeks - seen earlier today in the MAU due to lower abdominal pain - she was noted to have spontaneous rupture of membranes during examination.  [name] underwent an ultrasound indicated cerclage on 11/1 due to a cervical length of < 1 cm.  Her past OB history is remarkable for a 17 week loss - possibly associated with PROM ? Incompetent cervix.  [name] denies vaginal bleeding or uterine contractions, but does report some lower abdominal / pelvic pain.  OB History: OB History   Grav Para Term Preterm Abortions TAB SAB Ect Mult Living   2    1  1    0      PMH:  Past Medical History  Diagnosis Date  . Anemia     PSH:  Past Surgical History  Procedure Laterality Date  . Dilation and curettage of uterus  01/26/2012    following a miscarriage  . Cervical cerclage N/A 11/10/2012    Procedure: CERCLAGE CERVICAL;  Surgeon: Oliver Pila, MD;  Location: [facility];  Service: Gynecology;  Laterality: N/A;   Meds: Prenatal vitamins  Allergies:  Allergies  Allergen Reactions  . Cinnamon Shortness Of Breath  . Penicillins Shortness Of Breath and Swelling   FH: denies family history of birth defects or hereditary disorders  Soc:  Denies tobacco or ETOH use  Review of Systems: no vaginal bleeding or cramping/contractions, no LOF, no nausea/vomiting. All other systems reviewed and are negative.  Ultrasound:  Single IUP at 19 4/7 weeks s/p cerclage - PROM Normal anatomic fetal survey, although limited views of the fetal heart and face obtained due to fetal position Footling breech presentation - fetal parts in the lower uterine segment; does not appear to go beyond the cervix The lower uterine segment appears ballooned - there appears to be a small bridge of cervical tissue ? cerclage Over the course  of the exam, this bridging tissue resolved - ? possible erosion of the cerclage stitch Fetal tachycardia was noted (fetal heart rate 203 bpm) Amniotic fluid subjectively decreased  A/P: 1) Single IUP at 19 4/7 weeks         2) s/p McDonald cerclage for suspected cervical insufficiency         3) Second trimester PROM - Based on ultrasound findings and exam, feel that the likelihood of delivery is high in the next 24-48 hours.  We briefly discussed risks associated with second trimester PROM and different management strategies.  Approximately 50% of patients with 2nd trimester PROM will deliver within the first 7 days.  The average length of latency is approximately 17-18 days.  About 50% of patients with second trimester PROM will develop chorioamnionitis and require delivery.  We reviewed other risks to include pulmonary hypoplasia (about 10% - associated with 70-90% mortality), risk of placental abruption, risk of musculoskeletal issues (i.e. Club foot) due to oligohydramnios.  We also reviewed management options from induction of labor now due to poor prognosis of the fetus, expectant management with or without latency antibiotics and the plan for readmission at 22 6/7 weeks for a course of betamethasone and monitoring.  The patient currently is interested in admission for IV antibiotics and observation - with further outpatient management after 48 hours of IV antibiotics and readmission at [redacted] weeks gestation.  Recommendations: 1) Based on the patient's  pain and ultrasound findings, recommend repeating speculum exam and that the cerclage be removed.  Although there may be some benefit in prolonging latency by keeping the cerclage intact, this also increases the risk of infection.   2) Please check CBC - fetal tachycardia and abdominal pain is worrisome for possible chorioamnionitis.   3) Based on clinical presentation and ultrasound findings, feel that the likelihood of delivery within the next 24-48  hrs is high. In the event that she remains a candidate for expectant management, would recommend a 48 hr course of latency antibiotics (Erythromycin or Azithromycin +/- Vancomycin due to penicillin allergy) and transition to an additional 5 day course of po antibiotics thereafter.  If the patient is discharged, would recommend at least daily temperatures and at least weekly clinic follow up as well as serial WBC counts to screen for chorioamnionitis.  Would readmit at 22 6/7 weeks for course of betamethasone and NICU consult if the patient is undelivered at that time.  Findings and recommendations were discussed with Dr. Ellyn Hack.  Thank you for the opportunity to be a part of the care of [employer]. Please contact our office if we can be of further assistance.   I spent approximately 30 minutes with this patient with over 50% of time spent in face-to-face counseling.  Alpha Gula, MD Maternal Fetal Medicine

## 2012-12-01 ENCOUNTER — Encounter (HOSPITAL_COMMUNITY): Payer: BC Managed Care – PPO | Admitting: Anesthesiology

## 2012-12-01 ENCOUNTER — Inpatient Hospital Stay (HOSPITAL_COMMUNITY): Payer: BC Managed Care – PPO | Admitting: Anesthesiology

## 2012-12-01 ENCOUNTER — Encounter (HOSPITAL_COMMUNITY)
Admission: AD | Disposition: A | Discharge status: Home / Self Care | Payer: Self-pay | Source: Ambulatory Visit | Attending: Obstetrics and Gynecology

## 2012-12-01 ENCOUNTER — Encounter (HOSPITAL_COMMUNITY): Payer: Self-pay | Admitting: *Deleted

## 2012-12-01 HISTORY — PX: DILATION AND EVACUATION: SHX1459

## 2012-12-01 HISTORY — PX: CERVICAL CERCLAGE: SHX1329

## 2012-12-01 LAB — CBC
Hemoglobin: 9.6 g/dL — ABNORMAL LOW (ref 12.0–15.0)
MCH: 28.3 pg (ref 26.0–34.0)
MCV: 82.3 fL (ref 78.0–100.0)
RBC: 3.39 MIL/uL — ABNORMAL LOW (ref 3.87–5.11)
WBC: 26.4 10*3/uL — ABNORMAL HIGH (ref 4.0–10.5)

## 2012-12-01 SURGERY — DILATION AND EVACUATION, UTERUS
Anesthesia: Spinal | Site: Vagina | Wound class: Contaminated

## 2012-12-01 MED ORDER — BISACODYL 10 MG RE SUPP: 10.0000 mg | Freq: Every day | RECTAL | Status: DC | PRN

## 2012-12-01 MED ORDER — SIMETHICONE 80 MG PO CHEW: 80.0000 mg | CHEWABLE_TABLET | ORAL | Status: DC | PRN

## 2012-12-01 MED ORDER — FENTANYL CITRATE 0.05 MG/ML IJ SOLN: 25.0000 ug | INTRAMUSCULAR | Status: DC | PRN

## 2012-12-01 MED ORDER — KETOROLAC TROMETHAMINE 30 MG/ML IJ SOLN: 15.0000 mg | Freq: Once | INTRAMUSCULAR | Status: DC | PRN

## 2012-12-01 MED ORDER — MAGNESIUM HYDROXIDE 400 MG/5ML PO SUSP
30.0000 mL | Freq: Every day | ORAL | Status: DC | PRN
Start: 2012-12-01 — End: 2012-12-02
  Filled 2012-12-01: qty 30

## 2012-12-01 MED ORDER — LANOLIN HYDROUS EX OINT: TOPICAL_OINTMENT | CUTANEOUS | Status: DC | PRN

## 2012-12-01 MED ORDER — LIDOCAINE HCL 2 % IJ SOLN
INTRAMUSCULAR | Status: AC
  Filled 2012-12-01: qty 20

## 2012-12-01 MED ORDER — LACTATED RINGERS IV SOLN
INTRAVENOUS | Status: DC | PRN
  Administered 2012-12-01: 08:00:00 via INTRAVENOUS

## 2012-12-01 MED ORDER — PROMETHAZINE HCL 25 MG/ML IJ SOLN: 6.2500 mg | INTRAMUSCULAR | Status: DC | PRN

## 2012-12-01 MED ORDER — SODIUM CHLORIDE 0.9 % IV SOLN
1.0000 mg/h | INTRAVENOUS | Status: DC | PRN
  Filled 2012-12-01 (×2): qty 5

## 2012-12-01 MED ORDER — ZOLPIDEM TARTRATE 5 MG PO TABS: 5.0000 mg | ORAL_TABLET | Freq: Every evening | ORAL | Status: DC | PRN

## 2012-12-01 MED ORDER — PROMETHAZINE HCL 25 MG/ML IJ SOLN
12.5000 mg | Freq: Four times a day (QID) | INTRAMUSCULAR | Status: DC | PRN
  Administered 2012-12-01: 12.5 mg via INTRAVENOUS
  Filled 2012-12-01: qty 1

## 2012-12-01 MED ORDER — CITRIC ACID-SODIUM CITRATE 334-500 MG/5ML PO SOLN
ORAL | Status: AC
  Filled 2012-12-01: qty 15

## 2012-12-01 MED ORDER — BENZOCAINE-MENTHOL 20-0.5 % EX AERO: 1.0000 "application " | INHALATION_SPRAY | CUTANEOUS | Status: DC | PRN

## 2012-12-01 MED ORDER — SENNOSIDES-DOCUSATE SODIUM 8.6-50 MG PO TABS
2.0000 | ORAL_TABLET | ORAL | Status: DC
  Administered 2012-12-01: 2 via ORAL
  Filled 2012-12-01: qty 2

## 2012-12-01 MED ORDER — MIDAZOLAM HCL 2 MG/2ML IJ SOLN: 0.5000 mg | Freq: Once | INTRAMUSCULAR | Status: DC | PRN

## 2012-12-01 MED ORDER — MEPERIDINE HCL 25 MG/ML IJ SOLN: 6.2500 mg | INTRAMUSCULAR | Status: DC | PRN

## 2012-12-01 MED ORDER — PRENATAL MULTIVITAMIN CH: 1.0000 | ORAL_TABLET | Freq: Every day | ORAL | Status: DC

## 2012-12-01 MED ORDER — MISOPROSTOL 200 MCG PO TABS
800.0000 ug | ORAL_TABLET | Freq: Once | ORAL | Status: AC
  Administered 2012-12-01: 800 ug via VAGINAL

## 2012-12-01 MED ORDER — ONDANSETRON HCL 4 MG/2ML IJ SOLN: 4.0000 mg | INTRAMUSCULAR | Status: DC | PRN

## 2012-12-01 MED ORDER — MORPHINE SULFATE 10 MG/ML IJ SOLN
10.0000 mg | INTRAMUSCULAR | Status: DC | PRN
  Administered 2012-12-01: 10 mg via INTRAMUSCULAR
  Filled 2012-12-01: qty 1

## 2012-12-01 MED ORDER — POLYETHYLENE GLYCOL 3350 17 G PO PACK
17.0000 g | PACK | Freq: Every day | ORAL | Status: DC | PRN
  Filled 2012-12-01: qty 1

## 2012-12-01 MED ORDER — DIPHENHYDRAMINE HCL 25 MG PO CAPS: 25.0000 mg | ORAL_CAPSULE | Freq: Four times a day (QID) | ORAL | Status: DC | PRN

## 2012-12-01 MED ORDER — BUPIVACAINE IN DEXTROSE 0.75-8.25 % IT SOLN
INTRATHECAL | Status: DC | PRN
  Administered 2012-12-01: 1 mL via INTRATHECAL

## 2012-12-01 MED ORDER — DIBUCAINE 1 % RE OINT: 1.0000 "application " | TOPICAL_OINTMENT | RECTAL | Status: DC | PRN

## 2012-12-01 MED ORDER — BISACODYL 5 MG PO TBEC
10.0000 mg | DELAYED_RELEASE_TABLET | Freq: Every day | ORAL | Status: DC | PRN
  Administered 2012-12-01 (×2): 5 mg via ORAL
  Filled 2012-12-01: qty 2

## 2012-12-01 MED ORDER — LACTATED RINGERS IV SOLN
INTRAVENOUS | Status: DC | PRN
  Administered 2012-12-01: 09:00:00 via INTRAVENOUS

## 2012-12-01 MED ORDER — MISOPROSTOL 200 MCG PO TABS
ORAL_TABLET | ORAL | Status: AC
  Filled 2012-12-01: qty 4

## 2012-12-01 MED ORDER — HYDROMORPHONE HCL PF 1 MG/ML IJ SOLN
1.0000 mg | INTRAMUSCULAR | Status: DC | PRN
  Administered 2012-12-01: 1 mg via INTRAVENOUS
  Filled 2012-12-01: qty 1

## 2012-12-01 MED ORDER — METOCLOPRAMIDE HCL 5 MG/ML IJ SOLN: 10.0000 mg | Freq: Once | INTRAMUSCULAR | Status: DC | PRN

## 2012-12-01 MED ORDER — LIDOCAINE HCL 2 % IJ SOLN
INTRAMUSCULAR | Status: DC | PRN
  Administered 2012-12-01: 10 mL

## 2012-12-01 MED ORDER — ONDANSETRON HCL 4 MG PO TABS: 4.0000 mg | ORAL_TABLET | ORAL | Status: DC | PRN

## 2012-12-01 MED ORDER — IBUPROFEN 600 MG PO TABS
600.0000 mg | ORAL_TABLET | Freq: Four times a day (QID) | ORAL | Status: DC
  Administered 2012-12-01 – 2012-12-02 (×4): 600 mg via ORAL
  Filled 2012-12-01 (×3): qty 1

## 2012-12-01 MED ORDER — WITCH HAZEL-GLYCERIN EX PADS: 1.0000 "application " | MEDICATED_PAD | CUTANEOUS | Status: DC | PRN

## 2012-12-01 MED ORDER — LACTATED RINGERS IV SOLN: INTRAVENOUS | Status: DC

## 2012-12-01 MED ORDER — PROMETHAZINE HCL 25 MG PO TABS: 12.5000 mg | ORAL_TABLET | Freq: Four times a day (QID) | ORAL | Status: DC | PRN

## 2012-12-01 MED ORDER — OXYCODONE-ACETAMINOPHEN 5-325 MG PO TABS: 1.0000 | ORAL_TABLET | ORAL | Status: DC | PRN

## 2012-12-01 SURGICAL SUPPLY — 19 items
CATH ROBINSON RED A/P 16FR (CATHETERS) ×2 IMPLANT
CLOTH BEACON ORANGE TIMEOUT ST (SAFETY) ×2 IMPLANT
DECANTER SPIKE VIAL GLASS SM (MISCELLANEOUS) ×2 IMPLANT
GLOVE BIO SURGEON STRL SZ 6.5 (GLOVE) ×2 IMPLANT
GLOVE BIO SURGEON STRL SZ7 (GLOVE) ×2 IMPLANT
GOWN STRL REIN XL XLG (GOWN DISPOSABLE) ×4 IMPLANT
KIT BERKELEY 1ST TRIMESTER 3/8 (MISCELLANEOUS) ×2 IMPLANT
NEEDLE SPNL 22GX3.5 QUINCKE BK (NEEDLE) ×2 IMPLANT
NS IRRIG 1000ML POUR BTL (IV SOLUTION) ×2 IMPLANT
PACK VAGINAL MINOR WOMEN LF (CUSTOM PROCEDURE TRAY) ×2 IMPLANT
PAD OB MATERNITY 4.3X12.25 (PERSONAL CARE ITEMS) ×2 IMPLANT
PAD PREP 24X48 CUFFED NSTRL (MISCELLANEOUS) ×2 IMPLANT
SET BERKELEY SUCTION TUBING (SUCTIONS) ×2 IMPLANT
SYR CONTROL 10ML LL (SYRINGE) ×2 IMPLANT
TOWEL OR 17X24 6PK STRL BLUE (TOWEL DISPOSABLE) ×4 IMPLANT
VACURETTE 10 RIGID CVD (CANNULA) ×2 IMPLANT
VACURETTE 7MM CVD STRL WRAP (CANNULA) IMPLANT
VACURETTE 8 RIGID CVD (CANNULA) ×2 IMPLANT
VACURETTE 9 RIGID CVD (CANNULA) IMPLANT

## 2012-12-01 NOTE — Anesthesia Procedure Notes (Signed)
Spinal  Patient location during procedure: OR Start time: 12/01/2012 8:13 AM Staffing Anesthesiologist: Brayton Caves Performed by: anesthesiologist  Preanesthetic Checklist Completed: patient identified, site marked, surgical consent, pre-op evaluation, timeout performed, IV checked, risks and benefits discussed and monitors and equipment checked Spinal Block Patient position: sitting Prep: DuraPrep Patient monitoring: heart rate, cardiac monitor, continuous pulse ox and blood pressure Approach: midline Location: L3-4 Injection technique: single-shot Needle Needle type: Sprotte  Needle gauge: 24 G Needle length: 9 cm Assessment Sensory level: T4 Additional Notes Patient identified.  Risk benefits discussed including failed block, incomplete pain control, headache, nerve damage, paralysis, blood pressure changes, nausea, vomiting, reactions to medication both toxic or allergic, and postpartum back pain.  Patient expressed understanding and wished to proceed.  All questions were answered.  Sterile technique used throughout procedure.  CSF was clear.  No parasthesia or other complications.  Please see nursing notes for vital signs.

## 2012-12-01 NOTE — Progress Notes (Addendum)
Patient ID: Zariyah Stephens, female   DOB: October 25, 1990, 22 y.o.   MRN: [id number]  D/w pt prognosis with fetal parts at cervix. Pregnancy highly unlikely to progress.  Pt desires IOL - but doesn't want more pain.  D/w pt can take a while.  D/w pt pain management - wants something other than Stadol.  Will try Morphine 10mg  IM q , Dilaudid 1-2mg  q 1 hr for breakthrough pain.  Will closely monitor - with pulse ox.  Pt desires to have BM, having lots of rectal pressure.  D/w pt pushing may deliver baby, but can try.    Also phenergan for nausea.

## 2012-12-01 NOTE — Progress Notes (Signed)
Patient ID: Erika Townsend, female   DOB: [birth year], 22 y.o.   MRN: [id number]  Pt delivered nonviable fetus at 4:57am, placenta hasn't delivered.  Feels like it is at introitus, being held up by cerclage suture.  Pt being taken to the OR for cerclage removal and placental extraction, possible D&C.  D/w pt r/b/a of surgical procedure.  Voices understanding, will proceed.

## 2012-12-01 NOTE — Anesthesia Preprocedure Evaluation (Signed)
Anesthesia Evaluation  Patient identified by MRN, date of birth, ID band Patient awake    Reviewed: Allergy & Precautions, H&P , NPO status , Patient's Chart, lab work & pertinent test results  Airway       Dental   Pulmonary neg pulmonary ROS,          Cardiovascular negative cardio ROS      Neuro/Psych negative neurological ROS  negative psych ROS   GI/Hepatic negative GI ROS, Neg liver ROS,   Endo/Other  Morbid obesity  Renal/GU negative Renal ROS  negative genitourinary   Musculoskeletal   Abdominal   Peds  Hematology  (+) anemia ,   Anesthesia Other Findings   Reproductive/Obstetrics Retained POC & Cerclage IUFD x 2                           Anesthesia Physical Anesthesia Plan  ASA: III and emergent  Anesthesia Plan: Spinal   Post-op Pain Management:    Induction:   Airway Management Planned: Natural Airway  Additional Equipment:   Intra-op Plan:   Post-operative Plan:   Informed Consent: I have reviewed the patients History and Physical, chart, labs and discussed the procedure including the risks, benefits and alternatives for the proposed anesthesia with the patient or authorized representative who has indicated his/her understanding and acceptance.     Plan Discussed with: CRNA, Anesthesiologist and Surgeon  Anesthesia Plan Comments:         Anesthesia Quick Evaluation

## 2012-12-01 NOTE — Progress Notes (Signed)
Patient ID: Erika Townsend, female   DOB: 1990-05-02, 22 y.o.   MRN: [id number]  DELIVERY NOTE  Pt with pressure feeling like need to have BM.  Pushed off and on, intermittently over last several hours.  Pt awoke shortly before 5 am with need to void.  When voiding felt pressure, feet were at introitus.  With one push, delivered previable, deceased infant.  Noted to be female, delivery at 4:57am. apgars 0/0.  Wt = 9oz.  Patient with pressure/ctx - placenta not delivered.  On exam noted that one cerclage intact.  Also placenta not felt to be in vagina.  Will give it some time.  Also try cytotec PV to help uterus contract, expel placenta.  Close monitoring.  D/W pt possible need for D and C, at that time cerclage removal.

## 2012-12-01 NOTE — Progress Notes (Signed)
Patient was in the room with her baby's father.  She is 22; he is 21 (per them).  He made several comments indicating a definitive loyalty to her.  At present he is a Consulting civil engineer.  (All that info comes from them.)  The patient gave birth to a second child that died before term.  Last one in January (per Nurse).  They were very pleasant, and almost seemed to be devoid of affect associated with grief, especially since the last demise was 11 moths ago.  The father said the last one sought of made them aware and ready for the issues of this one.  The mother said she has an cervix/uterous that has not matured to her age and it cannot support the baby as it grows, thus triggering too early.  They do plan to have another, but plan to wait longer than they did this time.  Father said he planned to create a little home memorial for both the children, who they have given names.  We did talk about the planning for the next one more than any other topic, and they seemed aware of what they might need to do the next time around.    The assumption is they have had some talks this go-round, or both this and the last time, or maybe just the last time, but they were aware of different interventions to strengthen her cervical area.  What was most impressing was their feeling toward one another.  We talked about walking through this together.  Rema Tenasia, Chaplain Pager: [phone]

## 2012-12-01 NOTE — Transfer of Care (Signed)
Immediate Anesthesia Transfer of Care Note  Patient: Erika Townsend  Procedure(s) Performed: Procedure(s): DILATATION AND EVACUATION (N/A) CERCLAGE CERVICAL REMOVAL (N/A)  Patient Location: PACU  Anesthesia Type:Spinal  Level of Consciousness: awake and alert   Airway & Oxygen Therapy: Patient Spontanous Breathing  Post-op Assessment: Report given to PACU RN and Post -op Vital signs reviewed and stable  Post vital signs: Reviewed and stable  Complications: No apparent anesthesia complications

## 2012-12-01 NOTE — Anesthesia Postprocedure Evaluation (Signed)
  Anesthesia Post-op Note  Patient: Erika Townsend  Procedure(s) Performed: Procedure(s): DILATATION AND EVACUATION (N/A) CERCLAGE CERVICAL REMOVAL (N/A)  Patient Location: [facility]  Anesthesia Type:Spinal  Level of Consciousness: awake and alert   Airway and Oxygen Therapy: Patient Spontanous Breathing  Post-op Pain: mild  Post-op Assessment: Patient's Cardiovascular Status Stable, Respiratory Function Stable, Patent Airway, No signs of Nausea or vomiting, Pain level controlled, No headache, No residual numbness and No residual motor weakness  Post-op Vital Signs: stable  Complications: No apparent anesthesia complications

## 2012-12-01 NOTE — Anesthesia Postprocedure Evaluation (Signed)
  Anesthesia Post Note  Patient: Erika Townsend  Procedure(s) Performed: Procedure(s) (LRB): DILATATION AND EVACUATION (N/A) CERCLAGE CERVICAL REMOVAL (N/A)  Anesthesia type: Spinal  Patient location: PACU  Post pain: Pain level controlled  Post assessment: Post-op Vital signs reviewed  Last Vitals:  Filed Vitals:   12/01/12 1000  BP: 104/88  Pulse: 113  Temp: 37.1 C  Resp: 18    Post vital signs: Reviewed  Level of consciousness: awake  Complications: No apparent anesthesia complications

## 2012-12-02 ENCOUNTER — Encounter (HOSPITAL_COMMUNITY): Payer: Self-pay | Admitting: Obstetrics and Gynecology

## 2012-12-02 ENCOUNTER — Encounter (HOSPITAL_COMMUNITY): Payer: Self-pay | Admitting: *Deleted

## 2012-12-02 ENCOUNTER — Inpatient Hospital Stay (HOSPITAL_COMMUNITY)
Admission: AD | Admit: 2012-12-02 | Discharge: 2012-12-06 | DRG: 769 | Disposition: A | Discharge status: Home / Self Care | Payer: BC Managed Care – PPO | Source: Ambulatory Visit | Attending: Obstetrics and Gynecology | Admitting: Obstetrics and Gynecology

## 2012-12-02 DIAGNOSIS — O411290 Chorioamnionitis, unspecified trimester, not applicable or unspecified: Secondary | ICD-10-CM | POA: Diagnosis present

## 2012-12-02 DIAGNOSIS — Z9889 Other specified postprocedural states: Secondary | ICD-10-CM

## 2012-12-02 DIAGNOSIS — O8612 Endometritis following delivery: Secondary | ICD-10-CM

## 2012-12-02 HISTORY — DX: Endometritis following delivery: O86.12

## 2012-12-02 LAB — CBC
Hemoglobin: 8.6 g/dL — ABNORMAL LOW (ref 12.0–15.0)
MCHC: 33.9 g/dL (ref 30.0–36.0)
MCV: 82.7 fL (ref 78.0–100.0)
Platelets: 135 10*3/uL — ABNORMAL LOW (ref 150–400)
RDW: 13.6 % (ref 11.5–15.5)
WBC: 18.4 10*3/uL — ABNORMAL HIGH (ref 4.0–10.5)

## 2012-12-02 MED ORDER — PRENATAL MULTIVITAMIN CH: 1.0000 | ORAL_TABLET | Freq: Every day | ORAL | Status: DC

## 2012-12-02 MED ORDER — OXYCODONE-ACETAMINOPHEN 5-325 MG PO TABS: 1.0000 | ORAL_TABLET | Freq: Four times a day (QID) | ORAL | Status: DC | PRN

## 2012-12-02 MED ORDER — IBUPROFEN 800 MG PO TABS: 800.0000 mg | ORAL_TABLET | Freq: Three times a day (TID) | ORAL | Status: DC | PRN

## 2012-12-02 MED ORDER — ACETAMINOPHEN 500 MG PO TABS
1000.0000 mg | ORAL_TABLET | Freq: Once | ORAL | Status: AC
  Administered 2012-12-03: 1000 mg via ORAL
  Filled 2012-12-02: qty 2

## 2012-12-02 MED ORDER — FERROUS SULFATE 325 (65 FE) MG PO TABS: 325.0000 mg | ORAL_TABLET | Freq: Every day | ORAL | Status: DC

## 2012-12-02 NOTE — Brief Op Note (Signed)
11/30/2012 - 12/01/2012  10:40 AM  PATIENT:  Rolene Course  22 y.o. female  PRE-OPERATIVE DIAGNOSIS:  retained placenta,  removal of cervical cerclage  POST-OPERATIVE DIAGNOSIS:  retained placenta,  removal of cervical cerclage  PROCEDURE:  Procedure(s): DILATATION AND EVACUATION (N/A) CERCLAGE CERVICAL REMOVAL (N/A)  SURGEON:  Surgeon(s) and Role:    * Sherron Monday, MD - Primary  ANESTHESIA:   spinal  EBL:   700cc  BLOOD ADMINISTERED:none  DRAINS: none   LOCAL MEDICATIONS USED:  LIDOCAINE   SPECIMEN:  Source of Specimen:  Placenta  DISPOSITION OF SPECIMEN:  PATHOLOGY  COUNTS:  YES  TOURNIQUET:  * No tourniquets in log *  DICTATION: .Other Dictation: Dictation Number [id number]  PLAN OF CARE: Overnight to Gyn  PATIENT DISPOSITION:  PACU - hemodynamically stable.   Delay start of Pharmacological VTE agent (>24hrs) due to surgical blood loss or risk of bleeding: not applicable

## 2012-12-02 NOTE — Progress Notes (Signed)
Acknowledged MD order for support due to loss of pre-term baby.   Parents experienced another loss   of a preterm baby earlier this year.  Parents have met with the chaplin.  Spoke with both parents.  Acknowledged their loss.  The couple are not married but reside together.  They do not have a lot of local family support.  Informed that they are living in [place] because of school.  They both seem very supportive of each other.  Father was very attentive to mother. There was an absence of visible grief showed by both parents.  They didn't seem to want to talk about their loss at the time of CSW visit.  Encouraged continued support for each other.  Provided them with support group information and they were receptive.  Informed them of social work Surveyor, mining.   Claxton Levitz J, LCSW

## 2012-12-02 NOTE — Discharge Summary (Signed)
Obstetric Discharge Summary Reason for Admission: ROM Prenatal Procedures: none Intrapartum Procedures: spontaneous vaginal delivery Postpartum Procedures: curettage Complications-Operative and Postpartum: none Hemoglobin  Date Value Range Status  2012/12/17 8.6* 12.0 - 15.0 g/dL Final     HCT  Date Value Range Status  12-17-2012 25.4* 36.0 - 46.0 % Final    Physical Exam:  General: alert and no distress Lochia: appropriate Uterine Fundus: firm  Discharge Diagnoses: PPROM, vaginal delivery, D&C for retained placenta  Discharge Information: Date: 12/17/12 Activity: pelvic rest Diet: routine Medications: PNV, Ibuprofen, Iron and Percocet Condition: stable Instructions: refer to practice specific booklet Discharge to: home Follow-up Information   Follow up with [name],Preslee Regas, MD. Schedule an appointment as soon as possible for a visit in 4 weeks.   Specialty:  Obstetrics and Gynecology   Contact information:   [address] [place] [address] [phone]       Newborn Data: female  Birth Weight: 9.9 oz (281 g) APGAR: 0, 0 deceased  [name],Aleathia Purdy 12/17/12, 7:27 AM

## 2012-12-02 NOTE — Op Note (Signed)
Erika Townsend, Erika Townsend              ACCOUNT NO.:  [id number]  MEDICAL RECORD NO.:  [id number]  LOCATION:  9320                          FACILITY:  [facility]  PHYSICIAN:  Sherron Monday, MD        DATE OF BIRTH:  Apr 09, 1990  DATE OF PROCEDURE:  12/01/2012 DATE OF DISCHARGE:                              OPERATIVE REPORT   PREOPERATIVE DIAGNOSES:  Retained placenta and retained cervical cerclage.  POSTOPERATIVE DIAGNOSES:  Retained placenta and retained cervical cerclage.  PROCEDURE:  D and E with removal of cervical cerclage.  SURGEON:  Sherron Monday, MD  ANESTHESIA:  Spinal.  Paracervical block with lidocaine.  ESTIMATED BLOOD LOSS:  700 mL.  SPECIMENS:  Placenta to Pathology.  COMPLICATIONS:  None.  DESCRIPTION OF PROCEDURE:  After informed consent was reviewed with the patient and her significant other, she was transported to the OR. Initially, the procedure was planned to be performed without doing spinal anesthesia.  She was placed in the [place] stirrups.  An open- sided speculum was placed in her vagina.  The cerclage was easily visualized, clipped, and removed in its entirety.  The placenta was thought to be loose and directly at the cervix, however, was unable to be easily removed.  She was sat up.  Spinal was placed.  She was returned to the [facility] stirrups after properly anesthetized.  A open- sided speculum was replaced in her vagina, 10 mL of 2% lidocaine was used for a paracervical block.  The anterior lip of cervix was grasped with a single-tooth tenaculum, and an 8-French curette was used to remove the placenta.  This was unsuccessful.  It was somewhat teased out, and using a 10-French curette, multiple passes of the uterus yielded the placenta.  There was also copious bleeding.  The cervix was inspected to make sure that there was no cervical laceration given that the cerclage was retained throughout delivery of a 19-week fetus.  The cervix was thought to be  intact.  Products were removed with more passes.  The cervical bed appeared hemostatic.  Gentle curette was used with an additional pass with the suction curette.  The patient tolerated the procedure well, was returned to the supine position.  Sponge, lap, and needle counts were correct x2 per the operating room staff.  Plan was to have a CBC checked at 3:00 p.m., given the increased blood loss that was expected.     Sherron Monday, MD     JB/MEDQ  D:  12/02/2012  T:  12/02/2012  Job:  [id number]

## 2012-12-02 NOTE — Progress Notes (Signed)
Patient ID: Erika Townsend, female   DOB: 02/20/1990, 22 y.o.   MRN: [id number]   PPD 1/POD 1  S/p SVD, D&C, cerclage removal  No c/o's.  Nl lochia, pain controlled  AFVSS gen NAD Abd FNT  22yo s/p previable SVD after PPROM and cerclage failure with retained placenta and D&C. D/C with Motrin, percocet, Vitamin.  F/u 2 weeks.  S/p SW consult for loss.  Plan for consult with Dr. April Manson for ?abdominal cerclage.

## 2012-12-02 NOTE — Progress Notes (Signed)
Discharge instructions reviewed with patient and significant other.  Both state understanding of home care, activity, medications, signs/symptoms to report to MD and return MD office visit. No home equipment needed.  Emotional support information given to patient.  Patient ambulated for discharge in stable condition with staff without incident.

## 2012-12-02 NOTE — MAU Note (Signed)
Patient states she has been getting really cold and feeling weak. Abdominal pain since 2100. Took ibuprofen 400 mg at 2100 for a fever of 101

## 2012-12-03 ENCOUNTER — Encounter (HOSPITAL_COMMUNITY): Payer: BC Managed Care – PPO | Admitting: Anesthesiology

## 2012-12-03 ENCOUNTER — Encounter (HOSPITAL_COMMUNITY)
Admission: AD | Disposition: A | Discharge status: Home / Self Care | Payer: Self-pay | Source: Ambulatory Visit | Attending: Obstetrics and Gynecology

## 2012-12-03 ENCOUNTER — Inpatient Hospital Stay (HOSPITAL_COMMUNITY): Payer: BC Managed Care – PPO | Admitting: Anesthesiology

## 2012-12-03 ENCOUNTER — Encounter (HOSPITAL_COMMUNITY): Payer: Self-pay | Admitting: *Deleted

## 2012-12-03 ENCOUNTER — Inpatient Hospital Stay (HOSPITAL_COMMUNITY): Payer: BC Managed Care – PPO

## 2012-12-03 DIAGNOSIS — O8612 Endometritis following delivery: Secondary | ICD-10-CM

## 2012-12-03 DIAGNOSIS — Z9889 Other specified postprocedural states: Secondary | ICD-10-CM

## 2012-12-03 HISTORY — DX: Endometritis following delivery: O86.12

## 2012-12-03 HISTORY — PX: DILATION AND EVACUATION: SHX1459

## 2012-12-03 LAB — COMPREHENSIVE METABOLIC PANEL
AST: 13 U/L (ref 0–37)
Albumin: 2.4 g/dL — ABNORMAL LOW (ref 3.5–5.2)
Alkaline Phosphatase: 56 U/L (ref 39–117)
CO2: 23 mEq/L (ref 19–32)
Chloride: 102 mEq/L (ref 96–112)
Creatinine, Ser: 0.72 mg/dL (ref 0.50–1.10)
GFR calc non Af Amer: >90 mL/min (ref >90)
Total Bilirubin: 0.1 mg/dL — ABNORMAL LOW (ref 0.3–1.2)
Total Protein: 5.5 g/dL — ABNORMAL LOW (ref 6.0–8.3)

## 2012-12-03 LAB — CBC WITH DIFFERENTIAL/PLATELET
Basophils Relative: 0 % (ref 0–1)
Blasts: 0 %
Hemoglobin: 8.7 g/dL — ABNORMAL LOW (ref 12.0–15.0)
Lymphocytes Relative: 10 % — ABNORMAL LOW (ref 12–46)
Lymphs Abs: 2.2 10*3/uL (ref 0.7–4.0)
MCH: 28.4 pg (ref 26.0–34.0)
MCHC: 34 g/dL (ref 30.0–36.0)
Monocytes Relative: 7 % (ref 3–12)
Myelocytes: 0 %
Neutro Abs: 17.6 10*3/uL — ABNORMAL HIGH (ref 1.7–7.7)
Neutrophils Relative %: 82 % — ABNORMAL HIGH (ref 43–77)
Platelets: 152 10*3/uL (ref 150–400)
Promyelocytes Absolute: 0 %
RDW: 13.5 % (ref 11.5–15.5)
WBC: 21.5 10*3/uL — ABNORMAL HIGH (ref 4.0–10.5)
nRBC: 0 /100 WBC

## 2012-12-03 LAB — CBC
Hemoglobin: 8.4 g/dL — ABNORMAL LOW (ref 12.0–15.0)
MCH: 28.4 pg (ref 26.0–34.0)
MCHC: 34.4 g/dL (ref 30.0–36.0)
MCV: 82.4 fL (ref 78.0–100.0)
Platelets: 138 10*3/uL — ABNORMAL LOW (ref 150–400)
RDW: 13.5 % (ref 11.5–15.5)
WBC: 19.9 10*3/uL — ABNORMAL HIGH (ref 4.0–10.5)

## 2012-12-03 LAB — URINE CULTURE

## 2012-12-03 SURGERY — DILATION AND EVACUATION, UTERUS
Anesthesia: General | Site: Cervix | Wound class: Clean Contaminated

## 2012-12-03 MED ORDER — ALUM & MAG HYDROXIDE-SIMETH 200-200-20 MG/5ML PO SUSP: 30.0000 mL | ORAL | Status: DC | PRN

## 2012-12-03 MED ORDER — ONDANSETRON HCL 4 MG PO TABS: 4.0000 mg | ORAL_TABLET | Freq: Four times a day (QID) | ORAL | Status: DC | PRN

## 2012-12-03 MED ORDER — IBUPROFEN 800 MG PO TABS
800.0000 mg | ORAL_TABLET | Freq: Three times a day (TID) | ORAL | Status: DC | PRN
  Administered 2012-12-04: 800 mg via ORAL
  Filled 2012-12-03: qty 1

## 2012-12-03 MED ORDER — SIMETHICONE 80 MG PO CHEW
80.0000 mg | CHEWABLE_TABLET | Freq: Four times a day (QID) | ORAL | Status: DC | PRN
  Filled 2012-12-03: qty 1

## 2012-12-03 MED ORDER — LIDOCAINE HCL (CARDIAC) 20 MG/ML IV SOLN
INTRAVENOUS | Status: AC
  Filled 2012-12-03: qty 5

## 2012-12-03 MED ORDER — FENTANYL CITRATE 0.05 MG/ML IJ SOLN
INTRAMUSCULAR | Status: AC
  Filled 2012-12-03: qty 2

## 2012-12-03 MED ORDER — IBUPROFEN 800 MG PO TABS
800.0000 mg | ORAL_TABLET | Freq: Three times a day (TID) | ORAL | Status: DC | PRN
  Administered 2012-12-03 – 2012-12-04 (×3): 800 mg via ORAL
  Filled 2012-12-03 (×3): qty 1

## 2012-12-03 MED ORDER — ACETAMINOPHEN 325 MG PO TABS
650.0000 mg | ORAL_TABLET | ORAL | Status: DC | PRN
  Administered 2012-12-03 – 2012-12-05 (×7): 650 mg via ORAL
  Filled 2012-12-03 (×7): qty 2

## 2012-12-03 MED ORDER — DOXYCYCLINE HYCLATE 100 MG IV SOLR
100.0000 mg | Freq: Once | INTRAVENOUS | Status: AC
  Administered 2012-12-03: 100 mg via INTRAVENOUS
  Filled 2012-12-03: qty 100

## 2012-12-03 MED ORDER — MENTHOL 3 MG MT LOZG: 1.0000 | LOZENGE | OROMUCOSAL | Status: DC | PRN

## 2012-12-03 MED ORDER — MIDAZOLAM HCL 2 MG/2ML IJ SOLN: 0.5000 mg | Freq: Once | INTRAMUSCULAR | Status: AC | PRN

## 2012-12-03 MED ORDER — ONDANSETRON HCL 4 MG/2ML IJ SOLN
INTRAMUSCULAR | Status: DC | PRN
  Administered 2012-12-03: 4 mg via INTRAVENOUS

## 2012-12-03 MED ORDER — GUAIFENESIN 100 MG/5ML PO SOLN: 15.0000 mL | ORAL | Status: DC | PRN

## 2012-12-03 MED ORDER — LIDOCAINE HCL 2 % IJ SOLN
INTRAMUSCULAR | Status: AC
  Filled 2012-12-03: qty 20

## 2012-12-03 MED ORDER — PROPOFOL 10 MG/ML IV EMUL
INTRAVENOUS | Status: AC
  Filled 2012-12-03: qty 20

## 2012-12-03 MED ORDER — FERROUS SULFATE 325 (65 FE) MG PO TABS
325.0000 mg | ORAL_TABLET | Freq: Every day | ORAL | Status: DC
  Administered 2012-12-04 – 2012-12-06 (×3): 325 mg via ORAL
  Filled 2012-12-03 (×3): qty 1

## 2012-12-03 MED ORDER — PROPOFOL 10 MG/ML IV BOLUS
INTRAVENOUS | Status: DC | PRN
  Administered 2012-12-03: 200 mg via INTRAVENOUS

## 2012-12-03 MED ORDER — KETOROLAC TROMETHAMINE 30 MG/ML IJ SOLN: 15.0000 mg | Freq: Once | INTRAMUSCULAR | Status: AC | PRN

## 2012-12-03 MED ORDER — LACTATED RINGERS IV SOLN
INTRAVENOUS | Status: DC
  Administered 2012-12-03: 01:00:00 via INTRAVENOUS

## 2012-12-03 MED ORDER — MEPERIDINE HCL 25 MG/ML IJ SOLN: 6.2500 mg | INTRAMUSCULAR | Status: DC | PRN

## 2012-12-03 MED ORDER — FENTANYL CITRATE 0.05 MG/ML IJ SOLN
INTRAMUSCULAR | Status: DC | PRN
  Administered 2012-12-03: 100 ug via INTRAVENOUS

## 2012-12-03 MED ORDER — PRENATAL MULTIVITAMIN CH
1.0000 | ORAL_TABLET | Freq: Every day | ORAL | Status: DC
  Administered 2012-12-03 – 2012-12-06 (×2): 1 via ORAL
  Filled 2012-12-03 (×2): qty 1

## 2012-12-03 MED ORDER — OXYCODONE-ACETAMINOPHEN 5-325 MG PO TABS
1.0000 | ORAL_TABLET | Freq: Four times a day (QID) | ORAL | Status: DC | PRN
  Administered 2012-12-05: 1 via ORAL

## 2012-12-03 MED ORDER — ONDANSETRON HCL 4 MG/2ML IJ SOLN
INTRAMUSCULAR | Status: AC
  Filled 2012-12-03: qty 2

## 2012-12-03 MED ORDER — CLINDAMYCIN PHOSPHATE 900 MG/50ML IV SOLN: 900.0000 mg | Freq: Three times a day (TID) | INTRAVENOUS | Status: DC

## 2012-12-03 MED ORDER — GENTAMICIN SULFATE 40 MG/ML IJ SOLN
Freq: Three times a day (TID) | INTRAVENOUS | Status: DC
  Administered 2012-12-03 – 2012-12-04 (×4): via INTRAVENOUS
  Filled 2012-12-03 (×5): qty 4.25

## 2012-12-03 MED ORDER — SUCCINYLCHOLINE CHLORIDE 20 MG/ML IJ SOLN
INTRAMUSCULAR | Status: DC | PRN
  Administered 2012-12-03: 120 mg via INTRAVENOUS

## 2012-12-03 MED ORDER — MIDAZOLAM HCL 2 MG/2ML IJ SOLN
INTRAMUSCULAR | Status: DC | PRN
  Administered 2012-12-03: 2 mg via INTRAVENOUS

## 2012-12-03 MED ORDER — METRONIDAZOLE 500 MG PO TABS
500.0000 mg | ORAL_TABLET | Freq: Two times a day (BID) | ORAL | Status: DC
  Administered 2012-12-03: 500 mg via ORAL
  Filled 2012-12-03 (×6): qty 1

## 2012-12-03 MED ORDER — LACTATED RINGERS IV SOLN
INTRAVENOUS | Status: DC | PRN
  Administered 2012-12-03 (×2): via INTRAVENOUS

## 2012-12-03 MED ORDER — PRENATAL MULTIVITAMIN CH: 1.0000 | ORAL_TABLET | Freq: Every day | ORAL | Status: DC

## 2012-12-03 MED ORDER — ONDANSETRON HCL 4 MG/2ML IJ SOLN: 4.0000 mg | Freq: Four times a day (QID) | INTRAMUSCULAR | Status: DC | PRN

## 2012-12-03 MED ORDER — LIDOCAINE HCL (CARDIAC) 20 MG/ML IV SOLN
INTRAVENOUS | Status: DC | PRN
  Administered 2012-12-03: 50 mg via INTRAVENOUS

## 2012-12-03 MED ORDER — PROMETHAZINE HCL 25 MG/ML IJ SOLN: 6.2500 mg | INTRAMUSCULAR | Status: DC | PRN

## 2012-12-03 MED ORDER — LACTATED RINGERS IV SOLN: INTRAVENOUS | Status: DC

## 2012-12-03 MED ORDER — SCOPOLAMINE 1 MG/3DAYS TD PT72
MEDICATED_PATCH | TRANSDERMAL | Status: AC
  Filled 2012-12-03: qty 1

## 2012-12-03 MED ORDER — SCOPOLAMINE 1 MG/3DAYS TD PT72
1.0000 | MEDICATED_PATCH | TRANSDERMAL | Status: DC
  Administered 2012-12-03: 1.5 mg via TRANSDERMAL

## 2012-12-03 MED ORDER — LACTATED RINGERS IV SOLN
INTRAVENOUS | Status: DC
  Administered 2012-12-04 (×2): via INTRAVENOUS

## 2012-12-03 MED ORDER — LIDOCAINE HCL 2 % IJ SOLN
INTRAMUSCULAR | Status: DC | PRN
  Administered 2012-12-03: 10 mL

## 2012-12-03 MED ORDER — OXYCODONE-ACETAMINOPHEN 5-325 MG PO TABS
1.0000 | ORAL_TABLET | ORAL | Status: DC | PRN
  Administered 2012-12-03: 1 via ORAL
  Filled 2012-12-03 (×2): qty 1

## 2012-12-03 MED ORDER — FENTANYL CITRATE 0.05 MG/ML IJ SOLN: 25.0000 ug | INTRAMUSCULAR | Status: DC | PRN

## 2012-12-03 MED ORDER — MIDAZOLAM HCL 2 MG/2ML IJ SOLN
INTRAMUSCULAR | Status: AC
  Filled 2012-12-03: qty 2

## 2012-12-03 SURGICAL SUPPLY — 20 items
CATH ROBINSON RED A/P 16FR (CATHETERS) ×2 IMPLANT
CLOTH BEACON ORANGE TIMEOUT ST (SAFETY) ×2 IMPLANT
DECANTER SPIKE VIAL GLASS SM (MISCELLANEOUS) ×2 IMPLANT
GAUZE SPONGE 4X4 16PLY XRAY LF (GAUZE/BANDAGES/DRESSINGS) ×2 IMPLANT
GLOVE BIO SURGEON STRL SZ 6.5 (GLOVE) ×2 IMPLANT
GLOVE BIO SURGEON STRL SZ7 (GLOVE) ×2 IMPLANT
GOWN STRL REIN XL XLG (GOWN DISPOSABLE) ×6 IMPLANT
KIT BERKELEY 1ST TRIMESTER 3/8 (MISCELLANEOUS) ×2 IMPLANT
NEEDLE SPNL 22GX3.5 QUINCKE BK (NEEDLE) ×2 IMPLANT
NS IRRIG 1000ML POUR BTL (IV SOLUTION) ×2 IMPLANT
PACK VAGINAL MINOR WOMEN LF (CUSTOM PROCEDURE TRAY) ×2 IMPLANT
PAD OB MATERNITY 4.3X12.25 (PERSONAL CARE ITEMS) ×2 IMPLANT
PAD PREP 24X48 CUFFED NSTRL (MISCELLANEOUS) ×2 IMPLANT
SET BERKELEY SUCTION TUBING (SUCTIONS) ×2 IMPLANT
SYR CONTROL 10ML LL (SYRINGE) ×2 IMPLANT
TOWEL OR 17X24 6PK STRL BLUE (TOWEL DISPOSABLE) ×4 IMPLANT
VACURETTE 10 RIGID CVD (CANNULA) ×2 IMPLANT
VACURETTE 7MM CVD STRL WRAP (CANNULA) IMPLANT
VACURETTE 8 RIGID CVD (CANNULA) IMPLANT
VACURETTE 9 RIGID CVD (CANNULA) IMPLANT

## 2012-12-03 NOTE — Plan of Care (Signed)
Problem: Phase I Progression Outcomes Goal: Pain controlled with appropriate interventions Outcome: Completed/Met Date Met:  12/03/12 Was given Tylenol in MAU and this helped her pain Goal: OOB as tolerated unless otherwise ordered Outcome: Completed/Met Date Met:  12/03/12 Walked from wheelchair to bed Goal: Initial discharge plan identified Outcome: Completed/Met Date Met:  12/03/12 VSS Bleeding controlled Pain controlled Signs of infection improved Goal: Voiding-avoid urinary catheter unless indicated Outcome: Progressing Has not had the urge to void yet Goal: Hemodynamically stable Outcome: Progressing Heart rate is Tachy but not irregular

## 2012-12-03 NOTE — Anesthesia Postprocedure Evaluation (Signed)
  Anesthesia Post Note  Patient: Erika Townsend  Procedure(s) Performed: Procedure(s) (LRB): DILATATION AND EVACUATION (N/A)  Anesthesia type: GA  Patient location: PACU  Post pain: Pain level controlled  Post assessment: Post-op Vital signs reviewed  Last Vitals:  Filed Vitals:   12/03/12 0800  BP: 111/54  Pulse: 122  Temp: 39.4 C  Resp:     Post vital signs: Reviewed  Level of consciousness: sedated  Complications: No apparent anesthesia complications

## 2012-12-03 NOTE — MAU Provider Note (Signed)
History     CSN: [id number]  Arrival date and time: 12/02/12 2329   First Provider Initiated Contact with Patient 12/02/12 2354      Chief Complaint  Patient presents with  . Postpartum Complications   HPI  Erika Townsend is a 22 y.o. G2P0010 who is s/p Vaginal delivery of previable fetus with D&C afterward for retained products. She was sent home today, and states that by this evening she had developed a fever. It was 100.6, and she took some ibuprofen. The fever then went down, but returned and was 102.6. She states that her abdomen is very tender.   Past Medical History  Diagnosis Date  . Anemia   . SVD (spontaneous vaginal delivery) 12/02/2012  . Retained placenta parts or membranes, postpartum 12/02/2012  . S/P dilation and curettage 12/02/2012    Past Surgical History  Procedure Laterality Date  . Dilation and curettage of uterus  01/26/2012    following a miscarriage  . Cervical cerclage N/A 11/10/2012    Procedure: CERCLAGE CERVICAL;  Surgeon: Oliver Pila, MD;  Location: [facility];  Service: Gynecology;  Laterality: N/A;    Family History  Problem Relation Age of Onset  . Stroke Maternal Grandmother   . Heart disease Maternal Grandmother   . Cancer Maternal Grandfather     History  Substance Use Topics  . Smoking status: Never Smoker   . Smokeless tobacco: Never Used  . Alcohol Use: No    Allergies:  Allergies  Allergen Reactions  . Cinnamon Shortness Of Breath  . Penicillins Shortness Of Breath and Swelling  . Tape Rash    Tape causes irritation on patient arms    Prescriptions prior to admission  Medication Sig Dispense Refill  . ferrous sulfate 325 (65 FE) MG tablet Take 1 tablet (325 mg total) by mouth daily with breakfast.  30 tablet  3  . ibuprofen (ADVIL,MOTRIN) 800 MG tablet Take 1 tablet (800 mg total) by mouth every 8 (eight) hours as needed.  45 tablet  1  . oxyCODONE-acetaminophen (PERCOCET/ROXICET) 5-325 MG per tablet Take 1-2  tablets by mouth every 6 (six) hours as needed for severe pain (moderate - severe pain).  15 tablet  0  . Prenatal Vit-Fe Fumarate-FA (PRENATAL MULTIVITAMIN) TABS tablet Take 1 tablet by mouth daily at 12 noon.  30 tablet  12    ROS Physical Exam   Blood pressure 117/70, pulse 138, temperature 100.6 F (38.1 C), temperature source Oral, resp. rate 20, last menstrual period 07/16/2012, SpO2 98.00%.  Physical Exam  Nursing note and vitals reviewed. Constitutional: She is oriented to person, place, and time. She appears well-developed and well-nourished. No distress.  Cardiovascular: Normal rate.   Respiratory: Effort normal.  GI: Soft. There is tenderness.  Genitourinary:  Fundus 1FB below umbilicus, and tender.  Vagina: small amount of blood seen Cervix: slightly macerated appearance Uterus: tender   Neurological: She is alert and oriented to person, place, and time.  Skin: Skin is warm and dry.  Psychiatric: She has a normal mood and affect.    MAU Course  Procedures  Results for orders placed during the hospital encounter of 12/02/12 (from the past 24 hour(s))  CBC WITH DIFFERENTIAL     Status: Abnormal   Collection Time    12/02/12 11:40 PM      Result Value Range   WBC 21.5 (*) 4.0 - 10.5 K/uL   RBC 3.06 (*) 3.87 - 5.11 MIL/uL   Hemoglobin 8.7 (*) 12.0 -  15.0 g/dL   HCT 82.9 (*) 56.2 - 13.0 %   MCV 83.7  78.0 - 100.0 fL   MCH 28.4  26.0 - 34.0 pg   MCHC 34.0  30.0 - 36.0 g/dL   RDW 86.5  78.4 - 69.6 %   Platelets 152  150 - 400 K/uL   Neutrophils Relative % 82 (*) 43 - 77 %   Lymphocytes Relative 10 (*) 12 - 46 %   Monocytes Relative 7  3 - 12 %   Eosinophils Relative 1  0 - 5 %   Basophils Relative 0  0 - 1 %   Band Neutrophils 0  0 - 10 %   Metamyelocytes Relative 0     Myelocytes 0     Promyelocytes Absolute 0     Blasts 0     nRBC 0  0 /100 WBC   Neutro Abs 17.6 (*) 1.7 - 7.7 K/uL   Lymphs Abs 2.2  0.7 - 4.0 K/uL   Monocytes Absolute 1.5 (*) 0.1 - 1.0  K/uL   Eosinophils Absolute 0.2  0.0 - 0.7 K/uL   Basophils Absolute 0.0  0.0 - 0.1 K/uL   D/W Dr. Ellyn Hack, will admit for IV abx  Assessment and Plan   1. Endometritis following delivery    Admit to 3rd floor [address] and [address]le[address] Jews 12/03/2012, 12:03 AM

## 2012-12-03 NOTE — Brief Op Note (Signed)
12/02/2012 - 12/03/2012  1:47 PM  PATIENT:  Erika Townsend  22 y.o. female  PRE-OPERATIVE DIAGNOSIS:  Retained product of conception, endometritis  POST-OPERATIVE DIAGNOSIS:  Retained product of conception, endometritis  PROCEDURE:  Procedure(s): DILATATION AND EVACUATION (N/A)  SURGEON:  Surgeon(s) and Role:    * Sherron Monday, MD - Primary  ANESTHESIA:   general and paracervical block  EBL:  Total I/O In: 1650 [I.V.:1650] Out: 700 [Urine:500; Blood:200]  BLOOD ADMINISTERED:none  DRAINS: none   LOCAL MEDICATIONS USED:  LIDOCAINE , Amount: 10 ml   SPECIMEN:  Source of Specimen:  RPOC  DISPOSITION OF SPECIMEN:  PATHOLOGY  COUNTS:  YES  TOURNIQUET:  * No tourniquets in log *  DICTATION: .Other Dictation: Dictation Number [id number]  PLAN OF CARE: Admit for overnight observation  PATIENT DISPOSITION:  PACU - hemodynamically stable.   Delay start of Pharmacological VTE agent (>24hrs) due to surgical blood loss or risk of bleeding: not applicable

## 2012-12-03 NOTE — H&P (Signed)
Erika Townsend is a 22 y.o. female G2P0020 s/p 19wk delivery and D&E for retained placenta readmitted for chorioamnionitis.  Cerclage also clipped.  Had been treated with genta and clinda while laboring, not reprophylaxed before D&E.  Called with some elevated temperature 100 to 102, and without medicine back to 99 sent to MAU for evaluation.    History OB History   Grav Para Term Preterm Abortions TAB SAB Ect Mult Living   2 1   1  1    0    G1 17wk delivery and D&E G2 19wk delivery and D&E  Past Medical History  Diagnosis Date  . Anemia   . SVD (spontaneous vaginal delivery) 12/02/2012  . Retained placenta parts or membranes, postpartum 12/02/2012  . S/P dilation and curettage 12/02/2012   Past Surgical History  Procedure Laterality Date  . Dilation and curettage of uterus  01/26/2012    following a miscarriage  . Cervical cerclage N/A 11/10/2012    Procedure: CERCLAGE CERVICAL;  Surgeon: Oliver Pila, MD;  Location: [facility];  Service: Gynecology;  Laterality: N/A;   Family History: family history includes Cancer in her maternal grandfather; Heart disease in her maternal grandmother; Stroke in her maternal grandmother. Social History:  reports that she has never smoked. She has never used smokeless tobacco. She reports that she does not drink alcohol or use illicit drugs. Meds PNV, Ibuprofen All Cinnamon, Cinnamon, Tape     Review of Systems  Constitutional: Positive for fever and malaise/fatigue.  Respiratory: Negative.   Cardiovascular: Negative.   Gastrointestinal: Negative.   Genitourinary: Negative.   Musculoskeletal: Negative.       Blood pressure 117/70, pulse 112, temperature 99 F (37.2 C), temperature source Oral, resp. rate 20, height 5\' 3"  (1.6 m), weight 112.492 kg (248 lb), last menstrual period 07/16/2012, SpO2 100.00%. Maternal Exam:  Abdomen: Patient reports generalized tenderness.  Introitus: Normal vulva. Normal vagina.    Physical Exam   Constitutional: She is oriented to person, place, and time. She appears well-developed.  HENT:  Head: Normocephalic and atraumatic.  Cardiovascular: Normal rate and regular rhythm.   Respiratory: Effort normal and breath sounds normal. No respiratory distress. She has no wheezes.  GI: She exhibits no distension. There is generalized tenderness.  Musculoskeletal: Normal range of motion.  Neurological: She is alert and oriented to person, place, and time.  Skin: Skin is warm and dry.  Psychiatric: She has a normal mood and affect. Her behavior is normal.    Prenatal labs: ABO, Rh: --/--/O POS (11/21 1730) Antibody: NEG (11/21 1730) Rubella: Immune (10/09 0000) RPR: NON REACTIVE (11/21 1730)  HBsAg: Negative (10/09 0000)  HIV: Non-reactive (10/09 0000)  GBS:    Hgb stable  Assessment/Plan: 22yo G2P0020 s/p 19wk delivery and D&C - readmitted for endometritis. Natasha Bence and [place] for coverage [place] Close monitoring   [name],Carly Applegate 12/03/2012, 2:10 AM

## 2012-12-03 NOTE — Progress Notes (Signed)
I received a referral from RN to see Erika Townsend who was in for a second D&E following a loss.  She and her family had previously been visited by Kathrin Greathouse on Saturday.  Pt reported that she was doing fine and that she had a lot of support over the weekend.  She did not wish to talk further at this time, but is aware of on-going resources for grief support.  [employer] Pager, [phone] 12:11 PM   12/03/12 1200  Clinical Encounter Type  Visited With Patient and family together  Visit Type Follow-up  Referral From Nurse

## 2012-12-03 NOTE — Progress Notes (Signed)
ANTIBIOTIC CONSULT NOTE - INITIAL  Pharmacy Consult for Gentamicin Indication: Endometritis    Allergies  Allergen Reactions  . Cinnamon Shortness Of Breath  . Penicillins Shortness Of Breath and Swelling  . Tape Rash    Tape causes irritation on patient arms    Patient Measurements: Height: 5\' 3"  (160 cm) Weight: 248 lb (112.492 kg) IBW/kg (Calculated) : 52.4 Adjusted Body Weight: 70 kg  Vital Signs: Temp: 99 F (37.2 C) (11/24 0102) Temp src: Oral (11/24 0102) BP: 117/70 mmHg (11/23 2352) Pulse Rate: 112 (11/24 0147) Intake/Output from previous day:   Intake/Output from this shift:    Labs:  Recent Labs  12/01/12 1505 12/02/12 0506 12/02/12 2340  WBC 26.4* 18.4* 21.5*  HGB 9.6* 8.6* 8.7*  PLT 165 135* 152  CREATININE  --   --  0.72   Estimated Creatinine Clearance: 131.9 ml/min (by C-G formula based on Cr of 0.72). No results found for this basename: VANCOTROUGH, VANCOPEAK, VANCORANDOM, GENTTROUGH, GENTPEAK, GENTRANDOM, TOBRATROUGH, TOBRAPEAK, TOBRARND, AMIKACINPEAK, AMIKACINTROU, AMIKACIN,  in the last 72 hours   Microbiology: Recent Results (from the past 720 hour(s))  WET PREP, GENITAL     Status: Abnormal   Collection Time    11/15/12  7:25 PM      Result Value Range Status   Yeast Wet Prep HPF POC NONE SEEN  NONE SEEN Final   Trich, Wet Prep NONE SEEN  NONE SEEN Final   Clue Cells Wet Prep HPF POC NONE SEEN  NONE SEEN Final   WBC, Wet Prep HPF POC FEW (*) NONE SEEN Final   Comment: FEW BACTERIA SEEN  URINE CULTURE     Status: None   Collection Time    11/30/12 10:48 AM      Result Value Range Status   Specimen Description URINE, CLEAN CATCH   Final   Special Requests NONE   Final   Culture  Setup Time     Final   Value: 12/01/2012 20:52     Performed at [facility] Count     Final   Value: >=100,000 COLONIES/ML     Performed at [facility]   Culture     Final   Value: GRAM NEGATIVE RODS     Performed at [facility]   Report Status PENDING   Incomplete    Medical History: Past Medical History  Diagnosis Date  . Anemia   . SVD (spontaneous vaginal delivery) 12/02/2012  . Retained placenta parts or membranes, postpartum 12/02/2012  . S/P dilation and curettage 12/02/2012    Medications:  Clindamycin 900 mg IV every 8 hours  Assessment:  22 yo G2P0020 s/p 19 week delivery 11/22 and D&E for retained placenta; Now with increased temperature and probable endometritis.  Goal of Therapy:  Gentamicin peaks 6-8 mcg/ml; troughs < 1 mcg/ml  Plan:  Gentamicin 170 mg IV every 8 hours to be given with clindamycin Monitor renal function per protocol Serum gentamicin levels as indicated  Arelia Sneddon 12/03/2012,2:17 AM

## 2012-12-03 NOTE — Progress Notes (Signed)
Patient ID: Erika Townsend, female   DOB: [birth year], 22 y.o.   MRN: [id number]  Pt admitted with fever. US shows RPOC For D&E at 8:30 due to NPO issues. Also getting [place] D/W pt

## 2012-12-03 NOTE — Anesthesia Preprocedure Evaluation (Addendum)
Anesthesia Evaluation  Patient identified by MRN, date of birth, ID band Patient awake    Reviewed: Allergy & Precautions, H&P , Patient's Chart, lab work & pertinent test results  Airway Mallampati: II      Dental   Pulmonary  breath sounds clear to auscultation        Cardiovascular Exercise Tolerance: Good Rhythm:regular Rate:Normal     Neuro/Psych negative psych ROS   GI/Hepatic   Endo/Other    Renal/GU      Musculoskeletal   Abdominal   Peds  Hematology  (+) anemia ,   Anesthesia Other Findings   Reproductive/Obstetrics                           Anesthesia Physical Anesthesia Plan  ASA: III and emergent  Anesthesia Plan: General ETT   Post-op Pain Management:    Induction: Rapid sequence, Intravenous and Cricoid pressure planned  Airway Management Planned: Oral ETT  Additional Equipment:   Intra-op Plan:   Post-operative Plan:   Informed Consent: I have reviewed the patients History and Physical, chart, labs and discussed the procedure including the risks, benefits and alternatives for the proposed anesthesia with the patient or authorized representative who has indicated his/her understanding and acceptance.     Plan Discussed with: Anesthesiologist, CRNA and Surgeon  Anesthesia Plan Comments:       discussed npo status with OB and she feels that proceeding prior to adequately NPO would put patient at risk of sepsis.  Will proceed emergently. Anesthesia Quick Evaluation

## 2012-12-03 NOTE — Transfer of Care (Signed)
Immediate Anesthesia Transfer of Care Note  Patient: Erika Townsend  Procedure(s) Performed: Procedure(s): DILATATION AND EVACUATION (N/A)  Patient Location: PACU  Anesthesia Type:General  Level of Consciousness: awake  Airway & Oxygen Therapy: Patient Spontanous Breathing  Post-op Assessment: Report given to PACU RN  Post vital signs: stable  Filed Vitals:   12/03/12 0800  BP: 111/54  Pulse: 122  Temp: 39.4 C  Resp:     Complications: No apparent anesthesia complications

## 2012-12-04 ENCOUNTER — Encounter (HOSPITAL_COMMUNITY): Payer: Self-pay | Admitting: Obstetrics and Gynecology

## 2012-12-04 LAB — CBC
HCT: 21.4 % — ABNORMAL LOW (ref 36.0–46.0)
Hemoglobin: 7.3 g/dL — ABNORMAL LOW (ref 12.0–15.0)
MCH: 28.5 pg (ref 26.0–34.0)
MCHC: 34.1 g/dL (ref 30.0–36.0)
Platelets: 138 10*3/uL — ABNORMAL LOW (ref 150–400)
RBC: 2.56 MIL/uL — ABNORMAL LOW (ref 3.87–5.11)
WBC: 15.8 10*3/uL — ABNORMAL HIGH (ref 4.0–10.5)

## 2012-12-04 LAB — BASIC METABOLIC PANEL
BUN: 4 mg/dL — ABNORMAL LOW (ref 6–23)
CO2: 24 mEq/L (ref 19–32)
Calcium: 8.3 mg/dL — ABNORMAL LOW (ref 8.4–10.5)
Chloride: 103 mEq/L (ref 96–112)
GFR calc Af Amer: >90 mL/min (ref >90)
GFR calc non Af Amer: >90 mL/min (ref >90)
Glucose, Bld: 127 mg/dL — ABNORMAL HIGH (ref 70–99)
Potassium: 3.4 mEq/L — ABNORMAL LOW (ref 3.5–5.1)
Sodium: 137 mEq/L (ref 135–145)

## 2012-12-04 MED ORDER — DOXYCYCLINE HYCLATE 100 MG IV SOLR
100.0000 mg | Freq: Two times a day (BID) | INTRAVENOUS | Status: DC
  Administered 2012-12-04: 100 mg via INTRAVENOUS
  Filled 2012-12-04 (×2): qty 100

## 2012-12-04 MED ORDER — DEXTROSE 5 % IV SOLN
2.0000 g | Freq: Two times a day (BID) | INTRAVENOUS | Status: DC
  Administered 2012-12-04 – 2012-12-06 (×5): 2 g via INTRAVENOUS
  Filled 2012-12-04 (×5): qty 2

## 2012-12-04 MED ORDER — DOXYCYCLINE HYCLATE 100 MG IV SOLR
100.0000 mg | Freq: Once | INTRAVENOUS | Status: AC
  Administered 2012-12-04: 100 mg via INTRAVENOUS
  Filled 2012-12-04: qty 100

## 2012-12-04 NOTE — Op Note (Signed)
NAMEKAYLINE, SHEER              ACCOUNT NO.:  [id number]  MEDICAL RECORD NO.:  [id number]  LOCATION:  9320                          FACILITY:  [facility]  PHYSICIAN:  Sherron Monday, MD        DATE OF BIRTH:  [birth year]  DATE OF PROCEDURE:  12/03/2012 DATE OF DISCHARGE:                              OPERATIVE REPORT   PREOPERATIVE DIAGNOSES:  Status post 19-week delivery with retained products of conception after a D and C for retained placenta, endometritis.  POSTOPERATIVE DIAGNOSES:  Status post 19-week delivery with retained products of conception after a D and C for retained placenta, endometritis.  PROCEDURE:  Dilatation and evacuation.  SURGEON:  Sherron Monday, MD.  ANESTHESIA:  General with a paracervical block with 10 mL of 2% lidocaine.  EBL:  200 mL.  URINE OUTPUT:  500 mL of clear urine by I and O cath.  IV FLUIDS:  1650.  FINDINGS:  Enlarged postpartum uterus.  Intact cervix, dilated secondary to recent delivery.  COMPLICATIONS:  None.  PATHOLOGY:  Retained products of conception.  DESCRIPTION OF PROCEDURE:  After informed consent was reviewed with the patient and her significant other, she was transported to the operating room, placed on the table in supine position.  General anesthesia was induced, found to be adequate.  She was then placed in the [place] stirrups, prepped and draped in a normal sterile fashion.  Her bladder was sterilely drained.  Using an open-sided speculum, her cervix was easily visualized.  The anterior lip was grasped with a single-tooth tenaculum after paracervical block had been placed with 2 mL at 12 o'clock, 4 mL at 5 o'clock, and 4 mL at 7 o'clock.  The uterus easily accommodated a 10-French curette.  This was used through several passes obtaining a moderate amount of retained products.  A large currette was used and felt that all 4 quadrants were gritty.  One more pass with the suction curette was performed.  The patient tolerated  the procedure well.  The instruments were removed from vagina.  Her uterus was thought to be hemostatic.  Sponge, lap, and needle counts were correct x2 per the operating staff, and the patient was awakened in stable condition and transferred to the recovery room.     Sherron Monday, MD     JB/MEDQ  D:  12/03/2012  T:  12/04/2012  Job:  [id number]

## 2012-12-04 NOTE — Progress Notes (Signed)
   Subjective: Patient reports tolerating PO.  Doing well, feeling better.  Oral antibiotic (Flagyl) bothering her, lip swollen.  D/w pharmacy.  Objective: I have reviewed patient's vital signs, intake and output, medications, labs and microbiology.  General: alert and no distress Resp: clear to auscultation bilaterally Cardio: regular rate and rhythm GI: soft, non-tender; bowel sounds normal; no masses,  no organomegaly Extremities: extremities normal, atraumatic, no cyanosis or edema Vaginal Bleeding: minimal   Assessment/Plan: 22yo PPD#3/POD#3/POD#1 with PPROM and previable delivery also endometritis, retained placenta, retained POC.  Doing better.  Continued IV abx until  24hr afebrile.  Switch  abx to Cefotetan and Doxycycline  LOS: 2 days    [name],Franke Menter 12/04/2012, 7:47 AM   19wk PPROM 11/30/12, SVD and D&C 12/01/12, d/c to home 11/23 - readmitted 11/23-24.  D&C for retained products 11/24.  On Flagyl, Gentamicin, and Clindamycin.  With GBBS and Citrobacter UTI - sens to abx.  Tm= 102.9 at 1:29am, Tc =97.8.  Hgb 8.4-7.3.  Pt tolerating well.  Change abx to Cefotetan and Doxycycline.

## 2012-12-05 DIAGNOSIS — O411290 Chorioamnionitis, unspecified trimester, not applicable or unspecified: Secondary | ICD-10-CM | POA: Diagnosis present

## 2012-12-05 MED ORDER — DOXYCYCLINE HYCLATE 100 MG PO TABS
100.0000 mg | ORAL_TABLET | Freq: Two times a day (BID) | ORAL | Status: DC
  Administered 2012-12-05 – 2012-12-06 (×3): 100 mg via ORAL
  Filled 2012-12-05 (×3): qty 1

## 2012-12-05 NOTE — Progress Notes (Signed)
2 Days Post-Op Procedure(s) (LRB): also 3 days s/p SVD/ D&C retained placenta DILATATION AND EVACUATION (N/A)  Subjective: Patient reports tolerating PO.  Feeling better, but HA and crampy.  Fever yesterday PM.  If AF through this PM maybe home tonight  Objective: I have reviewed patient's vital signs, intake and output, medications, labs and pathology.  General: alert and no distress Resp: clear to auscultation bilaterally Cardio: regular rate and rhythm GI: soft, non-tender; bowel sounds normal; no masses,  no organomegaly Extremities: extremities normal, atraumatic, no cyanosis or edema Vaginal Bleeding: minimal  Assessment: s/p Procedure(s): DILATATION AND EVACUATION (N/A): stable  Plan: Encourage ambulation Continue ABX therapy due to Post-op infection (Cefotetan and Doxycycline)  Doing well.    LOS: 3 days    [name],Raphael Espe 12/05/2012, 8:17 AM

## 2012-12-05 NOTE — Progress Notes (Signed)
Pt had temp of 100.4 at 0212 today Will keep overnight and discharge in am if no fever to make sure she is afebrile 24+ hours.  Pt informed and agreeable.

## 2012-12-06 MED ORDER — DOXYCYCLINE HYCLATE 100 MG PO TABS: 100.0000 mg | ORAL_TABLET | Freq: Two times a day (BID) | ORAL | Status: DC

## 2012-12-06 MED ORDER — DOXYCYCLINE HYCLATE 100 MG PO TABS: 100.0000 mg | ORAL_TABLET | Freq: Two times a day (BID) | ORAL | Status: AC

## 2012-12-06 MED ORDER — FERROUS SULFATE 325 (65 FE) MG PO TABS: 325.0000 mg | ORAL_TABLET | Freq: Two times a day (BID) | ORAL | Status: DC

## 2012-12-06 NOTE — Discharge Summary (Signed)
Obstetric Discharge Summary Reason for Admission: fever, retained products, endometritis Prenatal Procedures: none Intrapartum Procedures: D&E Postpartum Procedures: curettagex2 Complications-Operative and Postpartum: pelvic infection Hemoglobin  Date Value Range Status  12/04/2012 7.3* 12.0 - 15.0 g/dL Final     HCT  Date Value Range Status  12/04/2012 21.4* 36.0 - 46.0 % Final    Physical Exam:  General: alert and no distress Lochia: appropriate Uterine Fundus: firm  Discharge Diagnoses: (previable delivery and D&E) D&E  Discharge Information: Date: 12/06/2012 Activity: pelvic rest Diet: routine Medications: Doxycycline Condition: stable Instructions: refer to practice specific booklet Discharge to: home Follow-up Information   Follow up with [name],Raynesha Tiedt, MD. Schedule an appointment as soon as possible for a visit in 2 weeks.   Specialty:  Obstetrics and Gynecology   Contact information:   [address] [place] [address] [phone]       Follow up with [name],Tilton Marsalis, MD. Schedule an appointment as soon as possible for a visit in 3 weeks.   Specialty:  Obstetrics and Gynecology   Contact information:   [address] [place] [address] [phone]       Newborn Data: Live born female  Birth Weight: 9.9 oz (281 g) APGAR: 0, 0  Home with mother.  [name],Jakiah Bienaime 12/06/2012, 10:10 AM

## 2012-12-06 NOTE — Progress Notes (Signed)
Pt discharged to home with significant other and father.  Condition stable.  Pt and significant other left room without notifying nursing staff.  No equipment for home ordered at discharge.

## 2012-12-06 NOTE — Progress Notes (Addendum)
3 Days Post-Op Procedure(s) (LRB): DILATATION AND EVACUATION (N/A) Also POD #4/PPD #4 D&E retained placenta, 19 wk SVD Subjective: Patient reports tolerating PO.  Feeling better    Objective: I have reviewed patient's vital signs, intake and output, medications, labs and pathology.  General: alert and no distress Abd soft, NT, ND  Assessment: s/p Procedure(s): DILATATION AND EVACUATION (N/A): stable, progressing well, tolerating diet and post-op fever s/p abx AF x 24 hr  Plan: Discharge home.  D/c with abx to complete 14 days  LOS: 4 days    [name],Chuckie Mccathern 12/06/2012, 9:16 AM

## 2012-12-07 LAB — TYPE AND SCREEN
ABO/RH(D): O POS
Antibody Screen: NEGATIVE
Unit division: 0

## 2012-12-27 NOTE — OR Nursing (Signed)
OR end time for Cervical cerclage done by K. Senaida Ores was 1540.

## 2013-01-31 ENCOUNTER — Encounter (HOSPITAL_BASED_OUTPATIENT_CLINIC_OR_DEPARTMENT_OTHER): Payer: Self-pay | Admitting: *Deleted

## 2013-02-05 ENCOUNTER — Encounter (HOSPITAL_BASED_OUTPATIENT_CLINIC_OR_DEPARTMENT_OTHER): Payer: Self-pay | Admitting: *Deleted

## 2013-02-05 NOTE — Progress Notes (Signed)
NPO AFTER MN WITH EXCEPTION CLEAR LIQUIDS UNTIL 0900. NEEDS HG AND URINE PREG.

## 2013-02-06 ENCOUNTER — Ambulatory Visit (HOSPITAL_BASED_OUTPATIENT_CLINIC_OR_DEPARTMENT_OTHER)
Admission: RE | Admit: 2013-02-06 | Discharge: 2013-02-06 | Disposition: A | Discharge status: Home / Self Care | Payer: BC Managed Care – PPO | Source: Ambulatory Visit | Attending: Obstetrics and Gynecology | Admitting: Obstetrics and Gynecology

## 2013-02-06 ENCOUNTER — Ambulatory Visit (HOSPITAL_COMMUNITY): Payer: BC Managed Care – PPO

## 2013-02-06 ENCOUNTER — Encounter (HOSPITAL_BASED_OUTPATIENT_CLINIC_OR_DEPARTMENT_OTHER): Payer: Self-pay

## 2013-02-06 ENCOUNTER — Ambulatory Visit (HOSPITAL_BASED_OUTPATIENT_CLINIC_OR_DEPARTMENT_OTHER): Payer: BC Managed Care – PPO | Admitting: Anesthesiology

## 2013-02-06 ENCOUNTER — Encounter (HOSPITAL_BASED_OUTPATIENT_CLINIC_OR_DEPARTMENT_OTHER)
Admission: RE | Disposition: A | Discharge status: Home / Self Care | Payer: Self-pay | Source: Ambulatory Visit | Attending: Obstetrics and Gynecology

## 2013-02-06 ENCOUNTER — Encounter (HOSPITAL_BASED_OUTPATIENT_CLINIC_OR_DEPARTMENT_OTHER): Payer: BC Managed Care – PPO | Admitting: Anesthesiology

## 2013-02-06 DIAGNOSIS — N856 Intrauterine synechiae: Secondary | ICD-10-CM | POA: Insufficient documentation

## 2013-02-06 DIAGNOSIS — O031 Delayed or excessive hemorrhage following incomplete spontaneous abortion: Secondary | ICD-10-CM | POA: Insufficient documentation

## 2013-02-06 HISTORY — PX: HYSTEROSCOPY WITH D & C: SHX1775

## 2013-02-06 HISTORY — DX: Intrauterine synechiae: N85.6

## 2013-02-06 LAB — POCT HEMOGLOBIN-HEMACUE: Hemoglobin: 10.8 g/dL — ABNORMAL LOW (ref 12.0–15.0)

## 2013-02-06 LAB — POCT PREGNANCY, URINE: Preg Test, Ur: NEGATIVE

## 2013-02-06 SURGERY — DILATATION AND CURETTAGE /HYSTEROSCOPY
Anesthesia: General | Site: Vagina

## 2013-02-06 MED ORDER — CLINDAMYCIN PHOSPHATE 900 MG/50ML IV SOLN
900.0000 mg | Freq: Once | INTRAVENOUS | Status: AC
  Administered 2013-02-06: 900 mg via INTRAVENOUS
  Filled 2013-02-06: qty 50

## 2013-02-06 MED ORDER — ONDANSETRON HCL 4 MG/2ML IJ SOLN
INTRAMUSCULAR | Status: DC | PRN
  Administered 2013-02-06: 4 mg via INTRAVENOUS

## 2013-02-06 MED ORDER — PROPOFOL 10 MG/ML IV BOLUS
INTRAVENOUS | Status: DC | PRN
  Administered 2013-02-06: 200 mg via INTRAVENOUS

## 2013-02-06 MED ORDER — PROMETHAZINE HCL 25 MG/ML IJ SOLN
6.2500 mg | INTRAMUSCULAR | Status: DC | PRN
  Filled 2013-02-06: qty 1

## 2013-02-06 MED ORDER — MEPERIDINE HCL 25 MG/ML IJ SOLN
6.2500 mg | INTRAMUSCULAR | Status: DC | PRN
  Filled 2013-02-06: qty 1

## 2013-02-06 MED ORDER — ACETAMINOPHEN-CODEINE #3 300-30 MG PO TABS: 1.0000 | ORAL_TABLET | ORAL | Status: DC | PRN

## 2013-02-06 MED ORDER — DEXAMETHASONE SODIUM PHOSPHATE 4 MG/ML IJ SOLN
INTRAMUSCULAR | Status: DC | PRN
  Administered 2013-02-06: 10 mg via INTRAVENOUS

## 2013-02-06 MED ORDER — SODIUM CHLORIDE 0.9 % IR SOLN
Status: DC | PRN
  Administered 2013-02-06: 3000 mL

## 2013-02-06 MED ORDER — LIDOCAINE HCL (CARDIAC) 20 MG/ML IV SOLN
INTRAVENOUS | Status: DC | PRN
  Administered 2013-02-06: 80 mg via INTRAVENOUS

## 2013-02-06 MED ORDER — LACTATED RINGERS IV SOLN
INTRAVENOUS | Status: DC
  Administered 2013-02-06 (×2): via INTRAVENOUS
  Filled 2013-02-06: qty 1000

## 2013-02-06 MED ORDER — GENTAMICIN SULFATE 40 MG/ML IJ SOLN
5.0000 mg/kg | INTRAVENOUS | Status: AC
  Administered 2013-02-06: 400 mg via INTRAVENOUS
  Filled 2013-02-06: qty 10

## 2013-02-06 MED ORDER — MIDAZOLAM HCL 5 MG/5ML IJ SOLN
INTRAMUSCULAR | Status: DC | PRN
  Administered 2013-02-06: 2 mg via INTRAVENOUS

## 2013-02-06 MED ORDER — GENTAMICIN SULFATE 40 MG/ML IJ SOLN
INTRAVENOUS | Status: DC
  Filled 2013-02-06: qty 14.75

## 2013-02-06 MED ORDER — DOXYCYCLINE HYCLATE 50 MG PO CAPS: 100.0000 mg | ORAL_CAPSULE | Freq: Two times a day (BID) | ORAL | Status: DC

## 2013-02-06 MED ORDER — ACETAMINOPHEN-CODEINE #3 300-30 MG PO TABS
ORAL_TABLET | ORAL | Status: AC
  Filled 2013-02-06: qty 1

## 2013-02-06 MED ORDER — CLINDAMYCIN PHOSPHATE 900 MG/50ML IV SOLN
INTRAVENOUS | Status: AC
  Filled 2013-02-06: qty 50

## 2013-02-06 MED ORDER — FENTANYL CITRATE 0.05 MG/ML IJ SOLN
INTRAMUSCULAR | Status: DC | PRN
  Administered 2013-02-06 (×2): 25 ug via INTRAVENOUS
  Administered 2013-02-06: 50 ug via INTRAVENOUS

## 2013-02-06 MED ORDER — ESTRADIOL 2 MG PO TABS: 4.0000 mg | ORAL_TABLET | Freq: Two times a day (BID) | ORAL | Status: DC

## 2013-02-06 MED ORDER — FENTANYL CITRATE 0.05 MG/ML IJ SOLN
INTRAMUSCULAR | Status: AC
  Filled 2013-02-06: qty 6

## 2013-02-06 MED ORDER — HYDROMORPHONE HCL PF 1 MG/ML IJ SOLN
0.2500 mg | INTRAMUSCULAR | Status: DC | PRN
  Administered 2013-02-06: 0.25 mg via INTRAVENOUS
  Filled 2013-02-06: qty 1

## 2013-02-06 MED ORDER — VASOPRESSIN 20 UNIT/ML IJ SOLN
INTRAVENOUS | Status: DC | PRN
  Administered 2013-02-06: 14:00:00 via INTRAMUSCULAR

## 2013-02-06 MED ORDER — HYDROMORPHONE HCL PF 1 MG/ML IJ SOLN
INTRAMUSCULAR | Status: AC
  Filled 2013-02-06: qty 1

## 2013-02-06 MED ORDER — OXYCODONE HCL 5 MG PO TABS
5.0000 mg | ORAL_TABLET | Freq: Once | ORAL | Status: DC | PRN
  Filled 2013-02-06: qty 1

## 2013-02-06 MED ORDER — OXYCODONE HCL 5 MG/5ML PO SOLN
5.0000 mg | Freq: Once | ORAL | Status: DC | PRN
  Filled 2013-02-06: qty 5

## 2013-02-06 MED ORDER — MIDAZOLAM HCL 2 MG/2ML IJ SOLN
INTRAMUSCULAR | Status: AC
Start: 2013-02-06 — End: 2013-02-06
  Filled 2013-02-06: qty 2

## 2013-02-06 MED ORDER — IOHEXOL 350 MG/ML SOLN
INTRAVENOUS | Status: DC | PRN
  Administered 2013-02-06: 5 mL

## 2013-02-06 MED ORDER — ACETAMINOPHEN-CODEINE #3 300-30 MG PO TABS
1.0000 | ORAL_TABLET | ORAL | Status: DC | PRN
  Administered 2013-02-06: 1 via ORAL
  Filled 2013-02-06: qty 1

## 2013-02-06 SURGICAL SUPPLY — 48 items
CANISTER SUCTION 2500CC (MISCELLANEOUS) ×2 IMPLANT
CANNULA CURETTE W/SYR 6 (CANNULA) IMPLANT
CATH ROBINSON RED A/P 16FR (CATHETERS) ×2 IMPLANT
COOK BALLOON UTERINE STENT ×2 IMPLANT
CORD ACTIVE DISPOSABLE (ELECTRODE)
CORD ELECTRO ACTIVE DISP (ELECTRODE) IMPLANT
COVER TABLE BACK 60X90 (DRAPES) ×2 IMPLANT
DRAPE CAMERA CLOSED 9X96 (DRAPES) ×2 IMPLANT
DRAPE LG THREE QUARTER DISP (DRAPES) ×4 IMPLANT
DRESSING TELFA 8X3 (GAUZE/BANDAGES/DRESSINGS) ×2 IMPLANT
ELECT LOOP GYNE PRO 24FR (CUTTING LOOP)
ELECT REM PT RETURN 9FT ADLT (ELECTROSURGICAL)
ELECT VAPORTRODE GRVD BAR (ELECTRODE) IMPLANT
ELECTRODE LOOP GYNE PRO 24FR (CUTTING LOOP) IMPLANT
ELECTRODE REM PT RTRN 9FT ADLT (ELECTROSURGICAL) IMPLANT
ELECTRODE ROLLER VERSAPOINT (ELECTRODE) IMPLANT
ELECTRODE RT ANGLE VERSAPOINT (CUTTING LOOP) IMPLANT
GLOVE BIO SURGEON STRL SZ8 (GLOVE) ×2 IMPLANT
GLOVE BIOGEL M STER SZ 6 (GLOVE) ×2 IMPLANT
GLOVE BIOGEL PI IND STRL 6.5 (GLOVE) ×1 IMPLANT
GLOVE BIOGEL PI IND STRL 7.5 (GLOVE) ×1 IMPLANT
GLOVE BIOGEL PI IND STRL 8.5 (GLOVE) ×1 IMPLANT
GLOVE BIOGEL PI INDICATOR 6.5 (GLOVE) ×1
GLOVE BIOGEL PI INDICATOR 7.5 (GLOVE) ×1
GLOVE BIOGEL PI INDICATOR 8.5 (GLOVE) ×1
GOWN STRL REIN XL XLG (GOWN DISPOSABLE) ×4 IMPLANT
GOWN STRL REUS W/TWL LRG LVL3 (GOWN DISPOSABLE) ×2 IMPLANT
GOWN STRL REUS W/TWL XL LVL3 (GOWN DISPOSABLE) ×2 IMPLANT
LEGGING LITHOTOMY PAIR STRL (DRAPES) ×2 IMPLANT
LOOP ANGLED CUTTING 22FR (CUTTING LOOP) IMPLANT
NEEDLE SPNL 22GX3.5 QUINCKE BK (NEEDLE) ×2 IMPLANT
PACK BASIN DAY SURGERY FS (CUSTOM PROCEDURE TRAY) ×2 IMPLANT
PAD OB MATERNITY 4.3X12.25 (PERSONAL CARE ITEMS) ×2 IMPLANT
SET BERKELEY SUCTION TUBING (SUCTIONS) ×2 IMPLANT
SET IRRIG Y TYPE TUR BLADDER L (SET/KITS/TRAYS/PACK) IMPLANT
STENT BALLN UTERINE 3CM 6FR (Stent) IMPLANT
STENT BALLN UTERINE 4CM 6FR (STENTS) ×2 IMPLANT
STERLING MEDICAL HSG CATHETER 5 FR ×2 IMPLANT
SUT SILK 2 0 SH (SUTURE) IMPLANT
SUT SILK 3 0 SH 30 (SUTURE) ×2 IMPLANT
SYR 20CC LL (SYRINGE) ×2 IMPLANT
SYR 3ML 18GX1 1/2 (SYRINGE) ×2 IMPLANT
SYR CONTROL 10ML LL (SYRINGE) ×2 IMPLANT
TOWEL OR 17X24 6PK STRL BLUE (TOWEL DISPOSABLE) ×4 IMPLANT
TRAY DSU PREP LF (CUSTOM PROCEDURE TRAY) ×2 IMPLANT
TUBING HYDROFLEX HYSTEROSCOPY (TUBING) ×2 IMPLANT
VACURETTE 8 RIGID CVD (CANNULA) ×2 IMPLANT
WATER STERILE IRR 500ML POUR (IV SOLUTION) ×2 IMPLANT

## 2013-02-06 NOTE — Op Note (Signed)
OPERATIVE NOTE  Preoperative diagnosis: Retained products of conception, intrauterine adhesions  Postoperative diagnosis: Retained old products of conception, intrauterine adhesions  Procedure: Hysteroscopy, Lysis of adhesions, suction D&C, intraoperative hysterosalpingogram, intrauterine stent placement  Surgeon: Erika Townsend,Erika Townsend  Anesthesia: General  Complications: None  Estimated blood loss: Less than 20 mL  Specimen: Endometrial curettings to pathology  Findings: Endocervical canal appeared normal. Endometrial cavity contained left lateral wall marginal dense adhesions, which were incised. The fundal portion of the endometrial cavity contained degenerating products of conception. After lysis of adhesions, and normal uterine anatomy was obtained. The right tubal ostium was seen. The left cornual region was clearly seen but the ostium was sealed over with normal-appearing endometrial tissue.  Intraoperative HSG before the intrauterine procedure revealed filling defects in the fundal aspect of the endometrial cavity and the right tubal patency. The left tube appeared proximally occluded. Despite increased intrauterine pressure, the left tube did not opacify. The uterus sounded to 8 cm.  Description of procedure: Patient was placed in dorsal supine position. General anesthesia was administered. She was placed in lithotomy position. She was prepped and draped in sterile manner. A vaginal speculum was placed. A dilute vasopressin solution containing 0.33 units per milliliter was injected into the cervical stroma x5 cc. A Slimline hysteroscope with 12 lens was inserted into the canal and above findings were noted. Distention medium was normal saline. Distention method was a hysteroscopic pump set at 80 mm mercury. Above findings were noted. Intraoperative HSG was done with a 5 French balloon catheter, with the balloon distended in the endocervical canal. Above HSG findings were noted. Using  hysteroscopic scissors, the dense left-sided marginal adhesions were taken down. In addition the products of conception were noted to be densely adherent to the uterine wall therefore scissors were used to cut and loose. This was performed meticulously in order not to perforate the fundus.  A suction D&C machine attached to it 8mm suction curet was used and the rest of the endometrial cavity that still contains some retained tissue was curetted, and the loose products of conception were aspirated. A sharp curet was used to focus on the regions of dense attachments of the products of conception to the uterine wall. An 8 mm Cook intrauterine balloon stent was rolled around a tonsil clamp after the cervix had been dilated to 9 mm with Hegars, and inserted into the endometrial cavity.  It was allowed to reform its shape by distending and with fluid and then the fluid was taken out.  The stem of the stent was sutured to the cervix with 3-0 silk. Hemostasis was insured. Instrument count was correct. Estimated blood loss was less than 20 mL. hysteroscopic fluid deficit (saline) was 500 mL. The patient tolerated the procedure well and was transferred to recovery in satisfactory condition. She will take doxycycline 100 mg twice a day until the stent is removed in the office in 2 weeks.  She will take oral estradiol 4 mg twice daily for 30 days.  Provera 10 mg daily will be added to the last 5 days of this regimen.  She will have at test of cure saline contrast SHG in about 6 weeks.  Erika Townsend,Erika Townsend

## 2013-02-06 NOTE — H&P (Signed)
History and Physical Interval Note:  02/06/2013 1:37 PM  Erika Townsend  has presented today for surgery, with the diagnosis of ASHERMANS SYNDROME  The various methods of treatment have been discussed with the patient and family. After consideration of risks, benefits and other options for treatment, the patient has consented to  Procedure(s): D&C HYSTEROSCOPY/LYSIS OF ADHESIONS/INTRAOP HSG COOK UTERINE STENT PLACEMENT (N/A) as a surgical intervention .  The patient's history has been reviewed, patient examined, no change in status, stable for surgery.  I have reviewed the patient's chart and labs.  Questions were answered to the patient's satisfaction.     Fermin SchwabYALCINKAYA,Kento Gossman

## 2013-02-06 NOTE — Anesthesia Preprocedure Evaluation (Signed)
Anesthesia Evaluation  Patient identified by MRN, date of birth, ID band Patient awake    Reviewed: Allergy & Precautions, H&P , NPO status , Patient's Chart, lab work & pertinent test results  Airway Mallampati: II TM Distance: >3 FB Neck ROM: Full    Dental  (+) Dental Advisory Given   Pulmonary neg pulmonary ROS,  breath sounds clear to auscultation        Cardiovascular negative cardio ROS  Rhythm:Regular Rate:Normal     Neuro/Psych negative neurological ROS  negative psych ROS   GI/Hepatic negative GI ROS, Neg liver ROS,   Endo/Other  Morbid obesity  Renal/GU negative Renal ROS     Musculoskeletal negative musculoskeletal ROS (+)   Abdominal (+) + obese,   Peds  Hematology  (+) Blood dyscrasia, anemia ,   Anesthesia Other Findings   Reproductive/Obstetrics (+) Pregnancy                           Anesthesia Physical  Anesthesia Plan  ASA: III  Anesthesia Plan: General   Post-op Pain Management:    Induction: Intravenous  Airway Management Planned:   Additional Equipment:   Intra-op Plan:   Post-operative Plan: Extubation in OR  Informed Consent: I have reviewed the patients History and Physical, chart, labs and discussed the procedure including the risks, benefits and alternatives for the proposed anesthesia with the patient or authorized representative who has indicated his/her understanding and acceptance.   Dental advisory given  Plan Discussed with: CRNA  Anesthesia Plan Comments:         Anesthesia Quick Evaluation

## 2013-02-06 NOTE — H&P (Signed)
Erika Townsend is a 23 y.o. female , originally referred for transabdominal cerclage, was found to have an abnormal SHG with soft tissue causing filling defect in rt aspect of fundus, likely retained products of conception from her last pregnancy loss, complicated by late postabortal bleeding and repeat D and C and infection.  Pertinent Gynecological History: Menses: flow is normal Contraception: none DES exposure: denies Blood transfusions: none Sexually transmitted diseases: no past history Last pap: normal    Menstrual History: Menarche age: 71 Patient's last menstrual period was 01/16/2013.   Past Medical History  Diagnosis Date  . Endometritis following delivery 12/03/2012  . Asherman syndrome                     Past Surgical History  Procedure Laterality Date  . Dilation and curettage of uterus  01/26/2012    following a miscarriage  . Cervical cerclage N/A 11/10/2012    Procedure: CERCLAGE CERVICAL;  Surgeon: Oliver Pila, MD;  Location: [facility];  Service: Gynecology;  Laterality: N/A;  . Dilation and evacuation N/A 12/01/2012    Procedure: DILATATION AND EVACUATION;  Surgeon: Sherron Monday, MD;  Location: [facility];  Service: Gynecology;  Laterality: N/A;  . Cervical cerclage N/A 12/01/2012    Procedure: CERCLAGE CERVICAL REMOVAL;  Surgeon: Sherron Monday, MD;  Location: [facility];  Service: Gynecology;  Laterality: N/A;  . Dilation and evacuation N/A 12/03/2012    Procedure: DILATATION AND EVACUATION;  Surgeon: Sherron Monday, MD;  Location: [facility];  Service: Gynecology;  Laterality: N/A;             Family History  Problem Relation Age of Onset  . Stroke Maternal Grandmother   . Heart disease Maternal Grandmother   . Cancer Maternal Grandfather    No hereditary disease.  No cancer of breast, ovary, uterus.  History   Social History  . Marital Status: Single    Spouse Name: N/A    Number of Children: N/A  . Years of Education: N/A   Occupational History  .  Not on file.   Social History Main Topics  . Smoking status: Never Smoker   . Smokeless tobacco: Never Used  . Alcohol Use: No  . Drug Use: No  . Sexual Activity: Not on file     Comment: last intercourse 09/15/2012   Other Topics Concern  . Not on file   Social History Narrative  . No narrative on file    Allergies  Allergen Reactions  . Cinnamon Shortness Of Breath  . Penicillins Shortness Of Breath and Swelling  . Flagyl [Metronidazole] Other (See Comments)    Reports itching and bumps in mouth   . Tape Rash    Tape causes irritation on patient arms    No current facility-administered medications on file prior to encounter.   No current outpatient prescriptions on file prior to encounter.     Review of Systems  Constitutional: Negative.   HENT: Negative.   Eyes: Negative.   Respiratory: Negative.   Cardiovascular: Negative.   Gastrointestinal: Negative.   Genitourinary: Negative.   Musculoskeletal: Negative.   Skin: Negative.   Neurological: Negative.   Endo/Heme/Allergies: Negative.   Psychiatric/Behavioral: Negative.     Physical Exam  BP 133/80  Pulse 82  Temp(Src) 97.3 F (36.3 C) (Oral)  Resp 16  Ht 5\' 4"  (1.626 m)  Wt 117.255 kg (258 lb 8 oz)  BMI 44.35 kg/m2  SpO2 98%  LMP 01/16/2013 Constitutional: She is oriented  to person, place, and time. She appears well-developed and well-nourished.  HENT:  Head: Normocephalic and atraumatic.  Nose: Nose normal.  Mouth/Throat: Oropharynx is clear and moist. No oropharyngeal exudate.  Eyes: Conjunctivae normal and EOM are normal. Pupils are equal, round, and reactive to light. No scleral icterus.  Neck: Normal range of motion. Neck supple. No tracheal deviation present. No thyromegaly present.  Cardiovascular: Normal rate.   Respiratory: Effort normal and breath sounds normal.  GI: Soft. Bowel sounds are normal. She exhibits no distension and no mass. There is no tenderness.  Lymphadenopathy:    She  has no cervical adenopathy.  Neurological: She is alert and oriented to person, place, and time. She has normal reflexes.  Skin: Skin is warm.  Psychiatric: She has a normal mood and affect. Her behavior is normal. Judgment and thought content normal.   Assessment/Plan:  Intrauterine adhesions (Asherman syndrome), probable retained products of conception from the ovary in November of 2014 History of cervical insufficiency with failure of elective cerclage  We will do a hysteroscopy, intraoperative HSG, (to evaluate the patency of the right tube where the filling defect is located), lysis of adhesions, probable suction D&C. I have discussed the benefits, risks, anticipated recovery and success chances of the proposed procedure with the patient.   Fermin SchwabYALCINKAYA,Draydon Clairmont

## 2013-02-06 NOTE — Discharge Instructions (Signed)
°  Post Anesthesia Home Care Instructions  Activity: Get plenty of rest for the remainder of the day. A responsible adult should stay with you for 24 hours following the procedure.  For the next 24 hours, DO NOT: -Drive a car -Advertising copywriterperate machinery -Drink alcoholic beverages -Take any medication unless instructed by your physician -Make any legal decisions or sign important papers.  Meals: Start with liquid foods such as gelatin or soup. Progress to regular foods as tolerated. Avoid greasy, spicy, heavy foods. If nausea and/or vomiting occur, drink only clear liquids until the nausea and/or vomiting subsides. Call your physician if vomiting continues.  Special Instructions/Symptoms: Your throat may feel dry or sore from the anesthesia or the breathing tube placed in your throat during surgery. If this causes discomfort, gargle with warm salt water. The discomfort should disappear within 24 hours.  Post Anesthesia Home Care Instructions  Activity: Get plenty of rest for the remainder of the day. A responsible adult should stay with you for 24 hours following the procedure.  For the next 24 hours, DO NOT: -Drive a car -Advertising copywriterperate machinery -Drink alcoholic beverages -Take any medication unless instructed by your physician -Make any legal decisions or sign important papers.  Meals: Start with liquid foods such as gelatin or soup. Progress to regular foods as tolerated. Avoid greasy, spicy, heavy foods. If nausea and/or vomiting occur, drink only clear liquids until the nausea and/or vomiting subsides. Call your physician if vomiting continues.  Special Instructions/Symptoms: Your throat may feel dry or sore from the anesthesia or the breathing tube placed in your throat during surgery. If this causes discomfort, gargle with warm salt water. The discomfort should disappear within 24 hours.   D & C Home care Instructions:   Personal hygiene:  Used sanitary napkins for vaginal drainage not  tampons. Shower or tub bathe the day after your procedure. No douching until bleeding stops. Always wipe from front to back after  Elimination.  Activity: Do not drive or operate any equipment today. The effects of the anesthesia are still present and drowsiness may result. Rest today, not necessarily flat bed rest, just take it easy. You may resume your normal activity in one to 2 days.  Sexual activity: No intercourse for one week or as indicated by your physician  Diet: Eat a light diet as desired this evening. You may resume a regular diet tomorrow.  Return to work: One to 2 days.  General Expectations of your surgery: Vaginal bleeding should be no heavier than a normal period. Spotting may continue up to 10 days. Mild cramps may continue for a couple of days. You may have a regular period in 2-6 weeks.  Unexpected observations call your doctor if these occur: persistent or heavy bleeding. Severe abdominal cramping or pain. Elevation of temperature greater than 100F.  Call for an appointment in one week.    Patient's Signature_______________________________________________________  Nurse's Signature________________________________________________________

## 2013-02-06 NOTE — Transfer of Care (Signed)
Immediate Anesthesia Transfer of Care Note  Patient: Erika Townsend  Procedure(s) Performed: Procedure(s) (LRB):  SUCTION D&C HYSTEROSCOPY/LYSIS OF ADHESIONS/ INTRAOP HSG COOK UTERINE STENT PLACEMENT (N/A)  Patient Location: PACU  Anesthesia Type: General  Level of Consciousness: awake, oriented, sedated and patient cooperative  Airway & Oxygen Therapy: Patient Spontanous Breathing and Patient connected to face mask oxygen  Post-op Assessment: Report given to PACU RN and Post -op Vital signs reviewed and stable  Post vital signs: Reviewed and stable  Complications: No apparent anesthesia complications

## 2013-02-06 NOTE — Anesthesia Postprocedure Evaluation (Signed)
Anesthesia Post Note  Patient: Erika Townsend  Procedure(s) Performed: Procedure(s) (LRB):  SUCTION D&C HYSTEROSCOPY/LYSIS OF ADHESIONS/ INTRAOP HSG COOK UTERINE STENT PLACEMENT (N/A)  Anesthesia type: General  Patient location: PACU  Post pain: Pain level controlled  Post assessment: Post-op Vital signs reviewed  Last Vitals: BP 147/97  Pulse 88  Temp(Src) 37.2 C (Oral)  Resp 21  Ht 5\' 4"  (1.626 m)  Wt 258 lb 8 oz (117.255 kg)  BMI 44.35 kg/m2  SpO2 100%  LMP 01/16/2013  Post vital signs: Reviewed  Level of consciousness: sedated  Complications: No apparent anesthesia complications

## 2013-02-06 NOTE — Anesthesia Procedure Notes (Signed)
Procedure Name: LMA Insertion Date/Time: 02/06/2013 1:56 PM Performed by: Renella CunasHAZEL, Shaylan Tutton D Pre-anesthesia Checklist: Patient identified, Emergency Drugs available, Suction available and Patient being monitored Patient Re-evaluated:Patient Re-evaluated prior to inductionOxygen Delivery Method: Circle System Utilized Preoxygenation: Pre-oxygenation with 100% oxygen Intubation Type: IV induction Ventilation: Mask ventilation without difficulty LMA: LMA inserted LMA Size: 5.0 Number of attempts: 1 Airway Equipment and Method: bite block Placement Confirmation: positive ETCO2 Tube secured with: Tape Dental Injury: Teeth and Oropharynx as per pre-operative assessment

## 2013-02-07 ENCOUNTER — Encounter (HOSPITAL_BASED_OUTPATIENT_CLINIC_OR_DEPARTMENT_OTHER): Payer: Self-pay | Admitting: Obstetrics and Gynecology

## 2013-02-11 ENCOUNTER — Other Ambulatory Visit (HOSPITAL_COMMUNITY): Payer: BC Managed Care – PPO | Admitting: Obstetrics and Gynecology

## 2013-02-11 DIAGNOSIS — R52 Pain, unspecified: Secondary | ICD-10-CM

## 2013-05-06 ENCOUNTER — Encounter (HOSPITAL_COMMUNITY): Payer: Self-pay | Admitting: *Deleted

## 2013-05-06 ENCOUNTER — Inpatient Hospital Stay (HOSPITAL_COMMUNITY)
Admission: AD | Admit: 2013-05-06 | Discharge: 2013-05-07 | Disposition: A | Discharge status: Home / Self Care | Payer: BC Managed Care – PPO | Source: Ambulatory Visit | Attending: Obstetrics and Gynecology | Admitting: Obstetrics and Gynecology

## 2013-05-06 DIAGNOSIS — R51 Headache: Secondary | ICD-10-CM | POA: Insufficient documentation

## 2013-05-06 DIAGNOSIS — R519 Headache, unspecified: Secondary | ICD-10-CM

## 2013-05-06 DIAGNOSIS — B3731 Acute candidiasis of vulva and vagina: Secondary | ICD-10-CM

## 2013-05-06 DIAGNOSIS — N946 Dysmenorrhea, unspecified: Secondary | ICD-10-CM | POA: Insufficient documentation

## 2013-05-06 DIAGNOSIS — R109 Unspecified abdominal pain: Secondary | ICD-10-CM | POA: Insufficient documentation

## 2013-05-06 DIAGNOSIS — B373 Candidiasis of vulva and vagina: Secondary | ICD-10-CM

## 2013-05-06 LAB — URINE MICROSCOPIC-ADD ON

## 2013-05-06 LAB — URINALYSIS, ROUTINE W REFLEX MICROSCOPIC
BILIRUBIN URINE: NEGATIVE
GLUCOSE, UA: NEGATIVE mg/dL
Hgb urine dipstick: NEGATIVE
KETONES UR: NEGATIVE mg/dL
Nitrite: NEGATIVE
PH: 6 (ref 5.0–8.0)
PROTEIN: NEGATIVE mg/dL
Specific Gravity, Urine: 1.02 (ref 1.005–1.030)
Urobilinogen, UA: 0.2 mg/dL (ref 0.0–1.0)

## 2013-05-06 LAB — CBC
HCT: 35.2 % — ABNORMAL LOW (ref 36.0–46.0)
HEMOGLOBIN: 11.1 g/dL — AB (ref 12.0–15.0)
MCH: 24.8 pg — ABNORMAL LOW (ref 26.0–34.0)
MCHC: 31.5 g/dL (ref 30.0–36.0)
MCV: 78.7 fL (ref 78.0–100.0)
Platelets: 248 10*3/uL (ref 150–400)
RBC: 4.47 MIL/uL (ref 3.87–5.11)
RDW: 15.7 % — ABNORMAL HIGH (ref 11.5–15.5)
WBC: 10.7 10*3/uL — ABNORMAL HIGH (ref 4.0–10.5)

## 2013-05-06 LAB — POCT PREGNANCY, URINE: Preg Test, Ur: NEGATIVE

## 2013-05-06 MED ORDER — IBUPROFEN 600 MG PO TABS
600.0000 mg | ORAL_TABLET | Freq: Once | ORAL | Status: DC
  Filled 2013-05-06: qty 1

## 2013-05-06 NOTE — MAU Provider Note (Signed)
Chief Complaint: Headache and Abdominal Pain   First Provider Initiated Contact with Patient 05/06/13 2226      SUBJECTIVE HPI: Erika Townsend is a 23 y.o. G9P0010 female who presents with low abdominal pain x3 days and frontal headache today. Rates abdominal pain 7/10 on pain scale. Describes it as intermittent, dull. Some improvement after voiding. If headache 6/10 on pain scale. Describes headache as a pressure sensation across his forehead and face. A recent flare of seasonal allergy symptoms.  LMP 04/14/2013. Not a true period--was a withdrawal bleed after Provera. Patient had not had a menstrual cycle since SAB at the end of 2014.   Past Medical History  Diagnosis Date  . Endometritis following delivery 12/03/2012  . Asherman syndrome    OB History  Gravida Para Term Preterm AB SAB TAB Ectopic Multiple Living  2 0   2 2    0    # Outcome Date GA Lbr Len/2nd Weight Sex Delivery Anes PTL Lv  2 SAB 12/01/12 [redacted]w[redacted]d 16:57 0.281 kg (9.9 oz) F  None  SB  1 SAB              Past Surgical History  Procedure Laterality Date  . Dilation and curettage of uterus  01/26/2012    following a miscarriage  . Cervical cerclage N/A 11/10/2012    Procedure: CERCLAGE CERVICAL;  Surgeon: Oliver Pila, MD;  Location: [facility];  Service: Gynecology;  Laterality: N/A;  . Dilation and evacuation N/A 12/01/2012    Procedure: DILATATION AND EVACUATION;  Surgeon: Sherron Monday, MD;  Location: [facility];  Service: Gynecology;  Laterality: N/A;  . Cervical cerclage N/A 12/01/2012    Procedure: CERCLAGE CERVICAL REMOVAL;  Surgeon: Sherron Monday, MD;  Location: [facility];  Service: Gynecology;  Laterality: N/A;  . Dilation and evacuation N/A 12/03/2012    Procedure: DILATATION AND EVACUATION;  Surgeon: Sherron Monday, MD;  Location: [facility];  Service: Gynecology;  Laterality: N/A;  . Hysteroscopy w/d&c N/A 02/06/2013    Procedure:  SUCTION D&C HYSTEROSCOPY/LYSIS OF ADHESIONS/ INTRAOP HSG COOK UTERINE STENT PLACEMENT;   Surgeon: Fermin Schwab, MD;  Location: [facility];  Service: Gynecology;  Laterality: N/A;   History   Social History  . Marital Status: Single    Spouse Name: N/A    Number of Children: N/A  . Years of Education: N/A   Occupational History  . Not on file.   Social History Main Topics  . Smoking status: Never Smoker   . Smokeless tobacco: Never Used  . Alcohol Use: No  . Drug Use: No  . Sexual Activity: Yes    Birth Control/ Protection: None   Other Topics Concern  . Not on file   Social History Narrative  . No narrative on file   No current facility-administered medications on file prior to encounter.   No current outpatient prescriptions on file prior to encounter.   Allergies  Allergen Reactions  . Cinnamon Shortness Of Breath  . Penicillins Shortness Of Breath and Swelling  . Flagyl [Metronidazole] Other (See Comments)    Reports itching and bumps in mouth   . Tape Rash    Tape causes irritation on patient arms    ROS: Pertinent positive items in HPI. Negative for fever, chills, dysuria, urgency, frequency, flank pain , nausea, vomiting, diarrhea, constipation, vaginal bleeding or vaginal discharge. Normal appetite.  OBJECTIVE Blood pressure 145/83, pulse 89, temperature 98.5 F (36.9 C), temperature source Oral, resp. rate 18, height 5'  4" (1.626 m), weight 120.203 kg (265 lb), last menstrual period 04/14/2013, SpO2 100.00%. GENERAL: Well-developed, well-nourished, obese female in no acute distress.  HEENT: Normocephalic. Moderate frontal and maxillary sinus tenderness. Mild congestion. HEART: normal rate RESP: normal effort ABDOMEN: Soft, non-tender. Positive bowel sounds x4. No CVA tenderness. EXTREMITIES: Nontender, no edema NEURO: Alert and oriented SPECULUM EXAM: NEFG, large amount of white, odorless, curd-like discharge, scant bright red blood noted coming from the os, cervix clean BIMANUAL: cervix closed; uterus normal size, no  adnexal tenderness or masses. No cervical motion tenderness.  LAB RESULTS Results for orders placed during the hospital encounter of 05/06/13 (from the past 24 hour(s))  URINALYSIS, ROUTINE W REFLEX MICROSCOPIC     Status: Abnormal   Collection Time    05/06/13  9:53 PM      Result Value Ref Range   Color, Urine YELLOW  YELLOW   APPearance CLEAR  CLEAR   Specific Gravity, Urine 1.020  1.005 - 1.030   pH 6.0  5.0 - 8.0   Glucose, UA NEGATIVE  NEGATIVE mg/dL   Hgb urine dipstick NEGATIVE  NEGATIVE   Bilirubin Urine NEGATIVE  NEGATIVE   Ketones, ur NEGATIVE  NEGATIVE mg/dL   Protein, ur NEGATIVE  NEGATIVE mg/dL   Urobilinogen, UA 0.2  0.0 - 1.0 mg/dL   Nitrite NEGATIVE  NEGATIVE   Leukocytes, UA SMALL (*) NEGATIVE  URINE MICROSCOPIC-ADD ON     Status: Abnormal   Collection Time    05/06/13  9:53 PM      Result Value Ref Range   Squamous Epithelial / LPF FEW (*) RARE   WBC, UA 0-2  <3 WBC/hpf   RBC / HPF 0-2  <3 RBC/hpf   Bacteria, UA RARE  RARE  POCT PREGNANCY, URINE     Status: None   Collection Time    05/06/13 10:13 PM      Result Value Ref Range   Preg Test, Ur NEGATIVE  NEGATIVE  CBC     Status: Abnormal   Collection Time    05/06/13 10:33 PM      Result Value Ref Range   WBC 10.7 (*) 4.0 - 10.5 K/uL   RBC 4.47  3.87 - 5.11 MIL/uL   Hemoglobin 11.1 (*) 12.0 - 15.0 g/dL   HCT 16.135.2 (*) 09.636.0 - 04.546.0 %   MCV 78.7  78.0 - 100.0 fL   MCH 24.8 (*) 26.0 - 34.0 pg   MCHC 31.5  30.0 - 36.0 g/dL   RDW 40.915.7 (*) 81.111.5 - 91.415.5 %   Platelets 248  150 - 400 K/uL    IMAGING No results found.  MAU COURSE Declined pain meds.  ASSESSMENT 1. Dysmenorrhea   2. Vaginal yeast infection   3. Sinus headache    PLAN Discharge home in stable condition per consult with Dr. Ellyn HackBovard. Ibuprofen 600 mg every 6 when necessary for pain. Comfort measures.     Follow-up Information   Follow up with [name]-Stuckert, Augusto GambleJody, MD. (As needed gynecologic concerns)    Specialty:  Obstetrics and  Gynecology   Contact information:   [address] [place] [id number]       Follow up with Primary care provider. (For worsening headache)       Follow up with [facility]. (For severe headache that does not improve with over-the-counter medication resting in a dark room)    Contact information:   [address] [place] [id number]  Medication List         fluconazole 100 MG tablet  Commonly known as:  DIFLUCAN  Take (150 mg total) by mouth every three (3) days as needed.       [place] Theran Vandergrift, CNM 05/07/2013  12:25 AM

## 2013-05-06 NOTE — MAU Note (Signed)
Pt reports headache today, lower abd pain x 3 days. LMP 04/14/2013

## 2013-05-07 DIAGNOSIS — N946 Dysmenorrhea, unspecified: Secondary | ICD-10-CM

## 2013-05-07 LAB — WET PREP, GENITAL
Clue Cells Wet Prep HPF POC: NONE SEEN
TRICH WET PREP: NONE SEEN

## 2013-05-07 LAB — GC/CHLAMYDIA PROBE AMP
CT PROBE, AMP APTIMA: NEGATIVE
GC Probe RNA: NEGATIVE

## 2013-05-07 MED ORDER — FLUCONAZOLE 100 MG PO TABS: 150.0000 mg | ORAL_TABLET | ORAL | Status: AC | PRN

## 2013-05-07 NOTE — Discharge Instructions (Signed)
Dysmenorrhea °Menstrual cramps (dysmenorrhea) are caused by the muscles of the uterus tightening (contracting) during a menstrual period. For some women, this discomfort is merely bothersome. For others, dysmenorrhea can be severe enough to interfere with everyday activities for a few days each month. °Primary dysmenorrhea is menstrual cramps that last a couple of days when you start having menstrual periods or soon after. This often begins after a teenager starts having her period. As a woman gets older or has a baby, the cramps will usually lessen or disappear. Secondary dysmenorrhea begins later in life, lasts longer, and the pain may be stronger than primary dysmenorrhea. The pain may start before the period and last a few days after the period.  °CAUSES  °Dysmenorrhea is usually caused by an underlying problem, such as: °· The tissue lining the uterus grows outside of the uterus in other areas of the body (endometriosis). °· The endometrial tissue, which normally lines the uterus, is found in or grows into the muscular walls of the uterus (adenomyosis). °· The pelvic blood vessels are engorged with blood just before the menstrual period (pelvic congestive syndrome). °· Overgrowth of cells (polyps) in the lining of the uterus or cervix. °· Falling down of the uterus (prolapse) because of loose or stretched ligaments. °· Depression. °· Bladder problems, infection, or inflammation. °· Problems with the intestine, a tumor, or irritable bowel syndrome. °· Cancer of the female organs or bladder. °· A severely tipped uterus. °· A very tight opening or closed cervix. °· Noncancerous tumors of the uterus (fibroids). °· Pelvic inflammatory disease (PID). °· Pelvic scarring (adhesions) from a previous surgery. °· Ovarian cyst. °· An intrauterine device (IUD) used for birth control. °RISK FACTORS °You may be at greater risk of dysmenorrhea if: °· You are younger than age 30. °· You started puberty early. °· You have  irregular or heavy bleeding. °· You have never given birth. °· You have a family history of this problem. °· You are a smoker. °SIGNS AND SYMPTOMS  °· Cramping or throbbing pain in your lower abdomen. °· Headaches. °· Lower back pain. °· Nausea or vomiting. °· Diarrhea. °· Sweating or dizziness. °· Loose stools. °DIAGNOSIS  °A diagnosis is based on your history, symptoms, physical exam, diagnostic tests, or procedures. Diagnostic tests or procedures may include: °· Blood tests. °· Ultrasonography. °· An examination of the lining of the uterus (dilation and curettage, D&C). °· An examination inside your abdomen or pelvis with a scope (laparoscopy). °· X-rays. °· CT scan. °· MRI. °· An examination inside the bladder with a scope (cystoscopy). °· An examination inside the intestine or stomach with a scope (colonoscopy, gastroscopy). °TREATMENT  °Treatment depends on the cause of the dysmenorrhea. Treatment may include: °· Pain medicine prescribed by your health care provider. °· Birth control pills or an IUD with progesterone hormone in it. °· Hormone replacement therapy. °· Nonsteroidal anti-inflammatory drugs (NSAIDs). These may help stop the production of prostaglandins. °· Surgery to remove adhesions, endometriosis, ovarian cyst, or fibroids. °· Removal of the uterus (hysterectomy). °· Progesterone shots to stop the menstrual period. °· Cutting the nerves on the sacrum that go to the female organs (presacral neurectomy). °· Electric current to the sacral nerves (sacral nerve stimulation). °· Antidepressant medicine. °· Psychiatric therapy, counseling, or group therapy. °· Exercise and physical therapy. °· Meditation and yoga therapy. °· Acupuncture. °HOME CARE INSTRUCTIONS  °· Only take over-the-counter or prescription medicines as directed by your health care provider. °· Place a heating pad   or hot water bottle on your lower back or abdomen. Do not sleep with the heating pad.  Use aerobic exercises, walking,  swimming, biking, and other exercises to help lessen the cramping.  Massage to the lower back or abdomen may help.  Stop smoking.  Avoid alcohol and caffeine. SEEK MEDICAL CARE IF:   Your pain does not get better with medicine.  You have pain with sexual intercourse.  Your pain increases and is not controlled with medicines.  You have abnormal vaginal bleeding with your period.  You develop nausea or vomiting with your period that is not controlled with medicine. SEEK IMMEDIATE MEDICAL CARE IF:  You pass out.  Document Released: 12/27/2004 Document Revised: 08/29/2012 Document Reviewed: 06/14/2012 [facility] Patient Information 2014 [place], [place].  Candidal Vulvovaginitis Candidal vulvovaginitis is an infection of the vagina and vulva. The vulva is the skin around the opening of the vagina. This may cause itching and discomfort in and around the vagina.  HOME CARE  Only take medicine as told by your doctor.  Do not have sex (intercourse) until the infection is healed or as told by your doctor.  Practice safe sex.  Tell your sex partner about your infection.  Do not douche or use tampons.  Wear cotton underwear. Do not wear tight pants or panty hose.  Eat yogurt. This may help treat and prevent yeast infections. GET HELP RIGHT AWAY IF:   You have a fever.  Your problems get worse during treatment or do not get better in 3 days.  You have discomfort, irritation, or itching in your vagina or vulva area.  You have pain after sex.  You start to get belly (abdominal) pain. MAKE SURE YOU:  Understand these instructions.  Will watch your condition.  Will get help right away if you are not doing well or get worse. Document Released: 03/25/2008 Document Revised: 03/21/2011 Document Reviewed: 03/25/2008 [facility] Patient Information 2014 [place], [place].  Sinus Headache A sinus headache is when your sinuses become clogged or swollen. Sinus headaches can range from  mild to severe.  CAUSES A sinus headache can have different causes, such as:  Colds.  Sinus infections.  Allergies. SYMPTOMS  Symptoms of a sinus headache may vary and can include:  Headache.  Pain or pressure in the face.  Congested or runny nose.  Fever.  Inability to smell.  Pain in upper teeth. Weather changes can make symptoms worse. TREATMENT  The treatment of a sinus headache depends on the cause.  Sinus pain caused by a bacterial sinus infection may be treated with antibiotic medicine.  Sinus pain caused by allergies may be helped by allergy medicines (antihistamines) and medicated nasal sprays.  Sinus pain caused by congestion may be helped by flushing the nose and sinuses with saline solution. HOME CARE INSTRUCTIONS   If antibiotics are prescribed, take them as directed. Finish them even if you start to feel better.  Only take over-the-counter or prescription medicines for pain, discomfort, or fever as directed by your caregiver.  If you have congestion, use a nasal spray to help reduce pressure. SEEK IMMEDIATE MEDICAL CARE IF:  You have a fever.  You have headaches more than once a week.  You have sensitivity to light or sound.  You have repeated nausea and vomiting.  You have vision problems.  You have sudden, severe pain in your face or head.  You have a seizure.  You are confused.  Your sinus headaches do not get better after treatment. Many  people think they have a sinus headache when they actually have migraines or tension headaches. MAKE SURE YOU:   Understand these instructions.  Will watch your condition.  Will get help right away if you are not doing well or get worse. Document Released: 02/04/2004 Document Revised: 03/21/2011 Document Reviewed: 03/27/2010 [facility] Patient Information 2014 [place], [place].

## 2013-05-10 LAB — URINE CULTURE
Colony Count: 80000
Special Requests: NORMAL

## 2013-08-21 ENCOUNTER — Encounter (HOSPITAL_COMMUNITY): Payer: Self-pay | Admitting: Emergency Medicine

## 2013-08-21 ENCOUNTER — Other Ambulatory Visit (HOSPITAL_COMMUNITY)
Admission: RE | Admit: 2013-08-21 | Discharge: 2013-08-21 | Disposition: A | Discharge status: Home / Self Care | Payer: BC Managed Care – PPO | Source: Ambulatory Visit | Attending: Family Medicine | Admitting: Family Medicine

## 2013-08-21 ENCOUNTER — Emergency Department (INDEPENDENT_AMBULATORY_CARE_PROVIDER_SITE_OTHER)
Admission: EM | Admit: 2013-08-21 | Discharge: 2013-08-21 | Disposition: A | Discharge status: Home / Self Care | Payer: BC Managed Care – PPO | Source: Home / Self Care

## 2013-08-21 DIAGNOSIS — Z3201 Encounter for pregnancy test, result positive: Secondary | ICD-10-CM

## 2013-08-21 DIAGNOSIS — N760 Acute vaginitis: Secondary | ICD-10-CM | POA: Diagnosis present

## 2013-08-21 DIAGNOSIS — Z113 Encounter for screening for infections with a predominantly sexual mode of transmission: Secondary | ICD-10-CM | POA: Diagnosis not present

## 2013-08-21 DIAGNOSIS — N949 Unspecified condition associated with female genital organs and menstrual cycle: Secondary | ICD-10-CM

## 2013-08-21 DIAGNOSIS — R102 Pelvic and perineal pain: Secondary | ICD-10-CM

## 2013-08-21 DIAGNOSIS — N939 Abnormal uterine and vaginal bleeding, unspecified: Secondary | ICD-10-CM

## 2013-08-21 DIAGNOSIS — N898 Other specified noninflammatory disorders of vagina: Secondary | ICD-10-CM

## 2013-08-21 LAB — POCT URINALYSIS DIP (DEVICE)
BILIRUBIN URINE: NEGATIVE
GLUCOSE, UA: NEGATIVE mg/dL
KETONES UR: NEGATIVE mg/dL
NITRITE: NEGATIVE
PH: 6 (ref 5.0–8.0)
Protein, ur: 30 mg/dL — AB
Specific Gravity, Urine: 1.025 (ref 1.005–1.030)
Urobilinogen, UA: 0.2 mg/dL (ref 0.0–1.0)

## 2013-08-21 LAB — POCT PREGNANCY, URINE: PREG TEST UR: POSITIVE — AB

## 2013-08-21 NOTE — ED Provider Notes (Signed)
CSN: [id number]     Arrival date & time 08/21/13  1342 History   First MD Initiated Contact with Patient 08/21/13 1433     Chief Complaint  Patient presents with  . Abdominal Pain   (Consider location/radiation/quality/duration/timing/severity/associated sxs/prior Treatment) HPI Comments: 23 year old obese white female complaining of pelvic pain for one month. Last week she submitted a urinalysis to a position in the practice that she attends and was told it was clear that because she had some symptoms of a UTI she was treated with Septra DS. The pelvic pain is intermittent, occurring for 2-3 days going away for 2-3 days and been cycling back on. She denies a vaginal discharge that has been scant amount of brown blood. She has burning pain with urination that happened twice, it does not occur with every void. She has had early morning nausea and vomiting twice in the past week. In January of 2015 she had a spontaneous abortion. As a result she underwent hysteroscopy salpingoscopy and D&C for removal of products of conception. LMP Early July.2015    Past Medical History  Diagnosis Date  . Endometritis following delivery 12/03/2012  . Asherman syndrome    Past Surgical History  Procedure Laterality Date  . Dilation and curettage of uterus  01/26/2012    following a miscarriage  . Cervical cerclage N/A 11/10/2012    Procedure: CERCLAGE CERVICAL;  Surgeon: Oliver Pila, MD;  Location: [facility];  Service: Gynecology;  Laterality: N/A;  . Dilation and evacuation N/A 12/01/2012    Procedure: DILATATION AND EVACUATION;  Surgeon: Sherron Monday, MD;  Location: [facility];  Service: Gynecology;  Laterality: N/A;  . Cervical cerclage N/A 12/01/2012    Procedure: CERCLAGE CERVICAL REMOVAL;  Surgeon: Sherron Monday, MD;  Location: [facility];  Service: Gynecology;  Laterality: N/A;  . Dilation and evacuation N/A 12/03/2012    Procedure: DILATATION AND EVACUATION;  Surgeon: Sherron Monday, MD;  Location: [facility];   Service: Gynecology;  Laterality: N/A;  . Hysteroscopy w/d&c N/A 02/06/2013    Procedure:  SUCTION D&C HYSTEROSCOPY/LYSIS OF ADHESIONS/ INTRAOP HSG COOK UTERINE STENT PLACEMENT;  Surgeon: Fermin Schwab, MD;  Location: [facility];  Service: Gynecology;  Laterality: N/A;   Family History  Problem Relation Age of Onset  . Stroke Maternal Grandmother   . Heart disease Maternal Grandmother   . Cancer Maternal Grandfather    History  Substance Use Topics  . Smoking status: Never Smoker   . Smokeless tobacco: Never Used  . Alcohol Use: No   OB History   Grav Para Term Preterm Abortions TAB SAB Ect Mult Living   2 0   2  2   0     Review of Systems  Constitutional: Negative for fever, diaphoresis, activity change and fatigue.  HENT: Negative.   Respiratory: Negative for cough and shortness of breath.   Cardiovascular: Negative for chest pain.  Gastrointestinal: Negative.   Genitourinary: Positive for urgency, vaginal bleeding and menstrual problem. Negative for dysuria, frequency, flank pain and vaginal discharge.  Musculoskeletal: Negative.   Neurological: Negative.     Allergies  Cinnamon; Penicillins; Flagyl; and Tape  Home Medications   Prior to Admission medications   Not on File   BP 128/79  Pulse 88  Temp(Src) 98.6 F (37 C) (Oral)  Resp 16  SpO2 100%  LMP 07/10/2013 Physical Exam  Nursing note and vitals reviewed. Constitutional: She is oriented to person, place, and time. She appears well-developed and well-nourished. No distress.  Relaxed posturing, No distress, ambulatory, smiling and jovial at times. Vital signs normal.  Cardiovascular: Normal rate and regular rhythm.   Pulmonary/Chest: Breath sounds normal. No respiratory distress.  Abdominal: Soft. She exhibits no distension and no mass. There is no tenderness. There is no rebound and no guarding.  Genitourinary:  External palpation reveals tenderness to the L adnexa and  suprapubis. NEFG Small amt of old, brown blood in the vaginal vault and covering the cx. No discharge. Cx midline posterior. Small os. Ectocx with a small mass most likely a Nabothian cyst. No CMT with instrumental movement: unable to reach cs manually/digitally. L adnexal tenderness.  Musculoskeletal: She exhibits no edema.  Neurological: She is alert and oriented to person, place, and time.  Skin: Skin is warm and dry. No erythema.  Psychiatric: She has a normal mood and affect.    ED Course  Procedures (including critical care time) Labs Review Labs Reviewed  POCT URINALYSIS DIP (DEVICE) - Abnormal; Notable for the following:    Hgb urine dipstick MODERATE (*)    Protein, ur 30 (*)    Leukocytes, UA SMALL (*)    All other components within normal limits  POCT PREGNANCY, URINE - Abnormal; Notable for the following:    Preg Test, Ur POSITIVE (*)    All other components within normal limits    Imaging Review No results found.   MDM   1. Pelvic pain in female   2. Positive urine pregnancy test   3. Vaginal bleeding    Pt spoke wit her GYN while in the UC. She was told to come to his office now. She understands and will go now.     Hayden Rasmussenavid Allante Whitmire, NP 08/21/13 1524

## 2013-08-21 NOTE — ED Notes (Signed)
Pt c/o intermittent lower abd pain x1 month Sx also include urinary retention and dysuria Was given Antibiotics for UTI?? By another Dr.  Denies f/v/n/d, vag d/c Alert w/no signs of acute distress.

## 2013-08-21 NOTE — ED Provider Notes (Signed)
Medical screening examination/treatment/procedure(s) were performed by a resident physician or non-physician practitioner and as the supervising physician I was immediately available for consultation/collaboration.  Nhia Heaphy, MD    Edilberto Roosevelt S Alverta Caccamo, MD 08/21/13 1940 

## 2013-08-21 NOTE — Discharge Instructions (Signed)
Pelvic Pain Pelvic pain is pain felt below the belly button and between your hips. It can be caused by many different things. It is important to get help right away. This is especially true for severe, sharp, or unusual pain that comes on suddenly.  HOME CARE  Only take medicine as told by your doctor.  Rest as told by your doctor.  Eat a healthy diet, such as fruits, vegetables, and lean meats.  Drink enough fluids to keep your pee (urine) clear or pale yellow, or as told.  Avoid sex (intercourse) if it causes pain.  Apply warm or cold packs to your lower belly (abdomen). Use the type of pack that helps the pain.  Avoid situations that cause you stress.  Keep a journal to track your pain. Write down:  When the pain started.  Where it is located.  If there are things that seem to be related to the pain, such as food or your period.  Follow up with your doctor as told. GET HELP RIGHT AWAY IF:   You have heavy bleeding from the vagina.  You have more pelvic pain.  You feel lightheaded or pass out (faint).  You have chills.  You have pain when you pee or have blood in your pee.  You cannot stop having watery poop (diarrhea).  You cannot stop throwing up (vomiting).  You have a fever or lasting symptoms for more than 3 days.  You have a fever and your symptoms suddenly get worse.  You are being physically or sexually abused.  Your medicine does not help your pain.  You have fluid (discharge) coming from your vagina that is not normal. MAKE SURE YOU:  Understand these instructions.  Will watch your condition.  Will get help if you are not doing well or get worse. Document Released: 06/15/2007 Document Revised: 06/28/2011 Document Reviewed: 04/18/2011 [facility] Patient Information 2015 [place], [place]. This information is not intended to replace advice given to you by your health care provider. Make sure you discuss any questions you have with your health care  provider.  Pregnancy Tests HOW DO PREGNANCY TESTS WORK? All pregnancy tests look for a special hormone in the urine or blood that is only present in pregnant women. This hormone, human chorionic gonadotropin (hCG), is also called the pregnancy hormone.  WHAT IS THE DIFFERENCE BETWEEN A URINE AND A BLOOD PREGNANCY TEST? IS ONE BETTER THAN THE OTHER? There are two types of pregnancy tests.  Blood tests.  Urine tests. Both tests look for the presence of hCG, the pregnancy hormone. Many women use a urine test or home pregnancy test (HPT) to find out if they are pregnant. HPTs are cheap, easy to use, can be done at home, and are private. When a woman has a positive result on an HPT, she needs to see her caregiver right away. The caregiver can confirm a positive HPT result with another urine test, a blood test, ultrasound, and a pelvic exam.  There are two types of blood tests you can get from a caregiver.   A quantitative blood test (or the beta hCG test). This test measures the exact amount of hCG in the blood. This means it can pick up very small amounts of hCG, making it a very accurate test.  A qualitative hCG blood test. This test gives a simple yes or no answer to whether you are pregnant. This test is more like a urine test in terms of its accuracy. Blood tests can pick  up hCG earlier in a pregnancy than urine tests can. Blood tests can tell if you are pregnant about 6 to 8 days after you release an egg from an ovary (ovulate). Urine tests can determine pregnancy about 2 weeks after ovulation.  HOW IS A HOME PREGNANCY TEST DONE?  There are many types of home pregnancy tests or HPTs that can be bought over-the-counter at drug or discount stores.   Some involve collecting your urine in a cup and dipping a stick into the urine or putting some of the urine into a special container with an eyedropper.  Others are done by placing a stick into your urine stream.  Tests vary in how long you need  to wait for the stick or container to turn a certain color or have a symbol on it (like a plus or a minus).  All tests come with written instructions. Most tests also have toll-free phone numbers to call if you have any questions about how to do the test or read the results. HOW ACCURATE ARE HOME PREGNANCY TESTS?  HPTs are very accurate. Most brands of HPTs say they are 97% to 99% accurate when taken 1 week after missing your menstrual period, but this can vary with actual use. Each brand varies in how sensitive it is in picking up the pregnancy hormone hCG. If a test is not done correctly, it will be less accurate. Always check the package to make sure it is not past its expiration date. If it is, it will not be accurate. Most brands of HPTs tell users to do the test again in a few days, no matter what the results.  If you use an HPT too early in your pregnancy, you may not have enough of the pregnancy hormone hCG in your urine to have a positive test result. Most HPTs will be accurate if you test yourself around the time your period is due (about 2 weeks after you ovulate). You can get a negative test result if you are not pregnant or if you ovulated later than you thought you did. You may also have problems with the pregnancy, which affects the amount of hCG you have in your urine. If your HPT is negative, test yourself again within a few days to 1 week. If you keep getting a negative result and think you are pregnant, talk with your caregiver right away about getting a blood pregnancy test.  FALSE POSITIVE PREGNANCY TEST A false positive HPT can happen if there is blood or protein present in your urine. A false positive can also happen if you were recently pregnant or if you take a pregnancy test too soon after taking fertility drug that contains hCG. Also, some prescription medicines such as water pills (diuretics), tranquilizers, seizure medicines, psychiatric medicines, and allergy and nausea medicines  (promethazine) give false positive readings. FALSE NEGATIVE PREGNANCY TEST  A false negative HPT can happen if you do the test too early. Try to wait until you are at least 1 day late for your menstrual period.  It may happen if you wait too long to test the urine (longer than 15 minutes).  It may also happen if the urine is too diluted because you drank a lot of fluids before getting the urine sample. It is best to test the first morning urine after you get out of bed. If your menstrual period did not start after a week of a negative HPT, repeat the pregnancy test. CAN ANYTHING INTERFERE WITH HOME  PREGNANCY TEST RESULTS?  Most medicines, both over-the-counter and prescription drugs, including birth control pills and antibiotics, should not affect the results of a HPT. Only those drugs that have the pregnancy hormone hCG in them can give a false positive test result. Drugs that have hCG in them may be used for treating infertility (not being able to get pregnant). Alcohol and illegal drugs do not affect HPT results, but you should not be using these substances if you are trying to get pregnant. If you have a positive pregnancy test, call your caregiver to make an appointment to begin prenatal care. Document Released: 12/30/2002 Document Revised: 03/21/2011 Document Reviewed: 04/12/2013 [facility] Patient Information 2015 [place]. This information is not intended to replace advice given to you by your health care provider. Make sure you discuss any questions you have with your health care provider.

## 2013-10-27 ENCOUNTER — Encounter (HOSPITAL_COMMUNITY): Payer: Self-pay | Admitting: *Deleted

## 2013-10-27 ENCOUNTER — Inpatient Hospital Stay (HOSPITAL_COMMUNITY)
Admission: AD | Admit: 2013-10-27 | Discharge: 2013-10-27 | Disposition: A | Discharge status: Home / Self Care | Payer: Medicaid Other | Source: Ambulatory Visit | Attending: Obstetrics and Gynecology | Admitting: Obstetrics and Gynecology

## 2013-10-27 DIAGNOSIS — O26892 Other specified pregnancy related conditions, second trimester: Secondary | ICD-10-CM | POA: Insufficient documentation

## 2013-10-27 DIAGNOSIS — Z3A15 15 weeks gestation of pregnancy: Secondary | ICD-10-CM | POA: Diagnosis not present

## 2013-10-27 DIAGNOSIS — R102 Pelvic and perineal pain: Secondary | ICD-10-CM | POA: Insufficient documentation

## 2013-10-27 LAB — URINALYSIS, ROUTINE W REFLEX MICROSCOPIC
Bilirubin Urine: NEGATIVE
Glucose, UA: NEGATIVE mg/dL
Ketones, ur: NEGATIVE mg/dL
NITRITE: NEGATIVE
PROTEIN: NEGATIVE mg/dL
Specific Gravity, Urine: 1.015 (ref 1.005–1.030)
Urobilinogen, UA: 0.2 mg/dL (ref 0.0–1.0)
pH: 6.5 (ref 5.0–8.0)

## 2013-10-27 LAB — WET PREP, GENITAL
Clue Cells Wet Prep HPF POC: NONE SEEN
Trich, Wet Prep: NONE SEEN
Yeast Wet Prep HPF POC: NONE SEEN

## 2013-10-27 LAB — CBC
HCT: 33.3 % — ABNORMAL LOW (ref 36.0–46.0)
Hemoglobin: 11.4 g/dL — ABNORMAL LOW (ref 12.0–15.0)
MCH: 28.2 pg (ref 26.0–34.0)
MCHC: 34.2 g/dL (ref 30.0–36.0)
MCV: 82.4 fL (ref 78.0–100.0)
Platelets: 235 10*3/uL (ref 150–400)
RBC: 4.04 MIL/uL (ref 3.87–5.11)
RDW: 13.8 % (ref 11.5–15.5)
WBC: 15.2 10*3/uL — AB (ref 4.0–10.5)

## 2013-10-27 LAB — URINE MICROSCOPIC-ADD ON

## 2013-10-27 NOTE — MAU Note (Signed)
Pt reports pain in lower abd that she states feels like contractions. States he had a transabd cerclage placed on 10/17/2013. Denies bleeding.

## 2013-10-27 NOTE — MAU Provider Note (Signed)
History     CSN: [id number]  Arrival date and time: 10/27/13 [id number]   First Provider Initiated Contact with Patient 10/27/13 2030      Chief Complaint  Patient presents with  . Abdominal Pain   Abdominal Pain    Erika Townsend is a 23 y.o. G3P0020 at 4274w4d who presents today with lower abdominal and back pain. She states that the pain started this morning. She took one percocet around 1230, and she has not taken anything since. She denies any fever, chills or body aches. She denies any vaginal bleeding or pain with urination.   Past Medical History  Diagnosis Date  . Endometritis following delivery 12/03/2012  . Asherman syndrome     Past Surgical History  Procedure Laterality Date  . Dilation and curettage of uterus  01/26/2012    following a miscarriage  . Cervical cerclage N/A 11/10/2012    Procedure: CERCLAGE CERVICAL;  Surgeon: Oliver PilaKathy W Richardson, MD;  Location: [facility];  Service: Gynecology;  Laterality: N/A;  . Dilation and evacuation N/A 12/01/2012    Procedure: DILATATION AND EVACUATION;  Surgeon: Sherron MondayJody [name], MD;  Location: [facility];  Service: Gynecology;  Laterality: N/A;  . Cervical cerclage N/A 12/01/2012    Procedure: CERCLAGE CERVICAL REMOVAL;  Surgeon: Sherron MondayJody [name], MD;  Location: [facility];  Service: Gynecology;  Laterality: N/A;  . Dilation and evacuation N/A 12/03/2012    Procedure: DILATATION AND EVACUATION;  Surgeon: Sherron MondayJody [name], MD;  Location: [facility];  Service: Gynecology;  Laterality: N/A;  . Hysteroscopy w/d&c N/A 02/06/2013    Procedure:  SUCTION D&C HYSTEROSCOPY/LYSIS OF ADHESIONS/ INTRAOP HSG COOK UTERINE STENT PLACEMENT;  Surgeon: Fermin Schwabamer Yalcinkaya, MD;  [facility];  Service: Gynecology;  Laterality: N/A;    Family History  Problem Relation Age of Onset  . Stroke Maternal Grandmother   . Heart disease Maternal Grandmother   . Cancer Maternal Grandfather     History  Substance Use Topics  . Smoking status: Never Smoker   .  Smokeless tobacco: Never Used  . Alcohol Use: No    Allergies:  Allergies  Allergen Reactions  . Cinnamon Shortness Of Breath  . Penicillins Shortness Of Breath and Swelling  . Flagyl [Metronidazole] Other (See Comments)    Reports itching and bumps in mouth   . Tape Rash    Tape causes irritation on patient arms    Prescriptions prior to admission  Medication Sig Dispense Refill  . magnesium hydroxide (MILK OF MAGNESIA) 800 MG/5ML suspension Take 15 mLs by mouth daily as needed for constipation.      Marland Kitchen. oxycodone-acetaminophen (PERCOCET) 2.5-325 MG per tablet Take 1 tablet by mouth every 4 (four) hours as needed for pain.      . Prenatal Vit-Fe Fumarate-FA (PRENATAL MULTIVITAMIN) TABS tablet Take 1 tablet by mouth daily at 12 noon.        Review of Systems  Gastrointestinal: Positive for abdominal pain.   Physical Exam   Blood pressure 140/94, pulse 99, temperature 98.6 F (37 C), temperature source Oral, resp. rate 18, height 5' 4.5" (1.638 m), weight 120.657 kg (266 lb), last menstrual period 07/10/2013, SpO2 100.00%.  Physical Exam  Nursing note and vitals reviewed. Constitutional: She is oriented to person, place, and time. She appears well-developed and well-nourished. No distress.  Cardiovascular: Normal rate.   Respiratory: Effort normal.  GI: Soft. There is no tenderness. There is no rebound.  Genitourinary:  Patient refuses exam with speculum Cervix: closed/thick  Neurological: She  is alert and oriented to person, place, and time.  Skin: Skin is warm and dry.  Psychiatric: She has a normal mood and affect.   Results for orders placed during the hospital encounter of 10/27/13 (from the past 24 hour(s))  URINALYSIS, ROUTINE W REFLEX MICROSCOPIC     Status: Abnormal   Collection Time    10/27/13  8:08 PM      Result Value Ref Range   Color, Urine YELLOW  YELLOW   APPearance CLEAR  CLEAR   Specific Gravity, Urine 1.015  1.005 - 1.030   pH 6.5  5.0 - 8.0    Glucose, UA NEGATIVE  NEGATIVE mg/dL   Hgb urine dipstick MODERATE (*) NEGATIVE   Bilirubin Urine NEGATIVE  NEGATIVE   Ketones, ur NEGATIVE  NEGATIVE mg/dL   Protein, ur NEGATIVE  NEGATIVE mg/dL   Urobilinogen, UA 0.2  0.0 - 1.0 mg/dL   Nitrite NEGATIVE  NEGATIVE   Leukocytes, UA SMALL (*) NEGATIVE  URINE MICROSCOPIC-ADD ON     Status: Abnormal   Collection Time    10/27/13  8:08 PM      Result Value Ref Range   Squamous Epithelial / LPF FEW (*) RARE   WBC, UA 3-6  <3 WBC/hpf   RBC / HPF 7-10  <3 RBC/hpf   Bacteria, UA FEW (*) RARE  CBC     Status: Abnormal   Collection Time    10/27/13  8:40 PM      Result Value Ref Range   WBC 15.2 (*) 4.0 - 10.5 K/uL   RBC 4.04  3.87 - 5.11 MIL/uL   Hemoglobin 11.4 (*) 12.0 - 15.0 g/dL   HCT 16.133.3 (*) 09.636.0 - 04.546.0 %   MCV 82.4  78.0 - 100.0 fL   MCH 28.2  26.0 - 34.0 pg   MCHC 34.2  30.0 - 36.0 g/dL   RDW 40.913.8  81.111.5 - 91.415.5 %   Platelets 235  150 - 400 K/uL  WET PREP, GENITAL     Status: Abnormal   Collection Time    10/27/13  9:13 PM      Result Value Ref Range   Yeast Wet Prep HPF POC NONE SEEN  NONE SEEN   Trich, Wet Prep NONE SEEN  NONE SEEN   Clue Cells Wet Prep HPF POC NONE SEEN  NONE SEEN   WBC, Wet Prep HPF POC MODERATE (*) NONE SEEN   Fern: negative   MAU Course  Procedures   @2100  the patient vomited and reports a gush of fluid from the vagina after vomiting.  Patient agreed to pelvic exam External: no lesion Vagina: moderate amount of thick, white discharge. No pooling  Cervix: pink, smooth, no fluid seen with valsalva   2134: D/W Dr. Ambrose MantleHenley ok for dc home at this time. Ok for dose of ibuprofen tonight.    Assessment and Plan   1. Pelvic pain affecting pregnancy in second trimester, antepartum    Comfort measures reviewed Patient will take one dose of ibuprofen tonight Return to MAU as needed Follow-up Information   Follow up with Bing PlumeHENLEY,THOMAS F, MD. (As scheduled)    Specialty:  Obstetrics and Gynecology    Contact information:   [address], [address] [place] [id number] [phone]        Tawnya CrookHogan, Heather Donovan 10/27/2013, 9:34 PM

## 2013-10-27 NOTE — MAU Note (Signed)
Pt states that she vomited and right after she did, she felt a gush of fluid. Obtained a slide to fern and H Hogan CNM notified.

## 2013-10-27 NOTE — Discharge Instructions (Signed)
You may take 800mg  of Ibuprofen tonight   Round Ligament Pain During Pregnancy Round ligament pain is a sharp pain or jabbing feeling often felt in the lower belly or groin area on one or both sides. It is one of the most common complaints during pregnancy and is considered a normal part of pregnancy. It is most often felt during the second trimester.  Here is what you need to know about round ligament pain, including some tips to help you feel better.  Causes of Round Ligament Pain  Several thick ligaments surround and support your womb (uterus) as it grows during pregnancy. One of them is called the round ligament.  The round ligament connects the front part of the womb to your groin, the area where your legs attach to your pelvis. The round ligament normally tightens and relaxes slowly.  As your baby and womb grow, the round ligament stretches. That makes it more likely to become strained.  Sudden movements can cause the ligament to tighten quickly, like a rubber band snapping. This causes a sudden and quick jabbing feeling.  Symptoms of Round Ligament Pain  Round ligament pain can be concerning and uncomfortable. But it is considered normal as your body changes during pregnancy.  The symptoms of round ligament pain include a sharp, sudden spasm in the belly. It usually affects the right side, but it may happen on both sides. The pain only lasts a few seconds.  Exercise may cause the pain, as will rapid movements such as:  sneezing coughing laughing rolling over in bed standing up too quickly  Treatment of Round Ligament Pain  Here are some tips that may help reduce your discomfort:  Pain relief. Take over-the-counter acetaminophen for pain, if necessary. Ask your doctor if this is OK.  Exercise. Get plenty of exercise to keep your stomach (core) muscles strong. Doing stretching exercises or prenatal yoga can be helpful. Ask your doctor which exercises are safe for you and  your baby.  A helpful exercise involves putting your hands and knees on the floor, lowering your head, and pushing your backside into the air.  Avoid sudden movements. Change positions slowly (such as standing up or sitting down) to avoid sudden movements that may cause stretching and pain.  Flex your hips. Bend and flex your hips before you cough, sneeze, or laugh to avoid pulling on the ligaments.  Apply warmth. A heating pad or warm bath may be helpful. Ask your doctor if this is OK. Extreme heat can be dangerous to the baby.  You should try to modify your daily activity level and avoid positions that may worsen the condition.  When to Call the Doctor/Midwife  Always tell your doctor or midwife about any type of pain you have during pregnancy. Round ligament pain is quick and doesn't last long.  Call your health care provider immediately if you have:  severe pain fever chills pain on urination difficulty walking  Belly pain during pregnancy can be due to many different causes. It is important for your doctor to rule out more serious conditions, including pregnancy complications such as placenta abruption or non-pregnancy illnesses such as:  inguinal hernia appendicitis stomach, liver, and kidney problems Preterm labor pains may sometimes be mistaken for round ligament pain.

## 2013-11-11 ENCOUNTER — Encounter (HOSPITAL_COMMUNITY): Payer: Self-pay | Admitting: *Deleted

## 2013-11-19 ENCOUNTER — Inpatient Hospital Stay (HOSPITAL_COMMUNITY)
Admission: AD | Admit: 2013-11-19 | Discharge: 2013-11-19 | Discharge status: Against Medical Advice | Payer: BC Managed Care – PPO | Source: Ambulatory Visit | Attending: Obstetrics and Gynecology | Admitting: Obstetrics and Gynecology

## 2013-11-19 NOTE — Progress Notes (Signed)
Pt not in lobby.  

## 2013-11-19 NOTE — Progress Notes (Signed)
Called patient, she is not in the lobby.  Person in lobby said she went outside.  I walked out in MAU circle and no one in 15 min parking area. Will continue to check lobby for her.

## 2013-11-27 ENCOUNTER — Encounter (HOSPITAL_COMMUNITY): Payer: Self-pay

## 2013-11-27 ENCOUNTER — Inpatient Hospital Stay (HOSPITAL_COMMUNITY)
Admission: AD | Admit: 2013-11-27 | Discharge: 2013-11-27 | Disposition: A | Discharge status: Home / Self Care | Payer: Medicaid Other | Source: Ambulatory Visit | Attending: Obstetrics and Gynecology | Admitting: Obstetrics and Gynecology

## 2013-11-27 DIAGNOSIS — O3432 Maternal care for cervical incompetence, second trimester: Secondary | ICD-10-CM | POA: Diagnosis not present

## 2013-11-27 DIAGNOSIS — R109 Unspecified abdominal pain: Secondary | ICD-10-CM | POA: Insufficient documentation

## 2013-11-27 DIAGNOSIS — O9989 Other specified diseases and conditions complicating pregnancy, childbirth and the puerperium: Secondary | ICD-10-CM | POA: Insufficient documentation

## 2013-11-27 DIAGNOSIS — Z3A20 20 weeks gestation of pregnancy: Secondary | ICD-10-CM | POA: Insufficient documentation

## 2013-11-27 DIAGNOSIS — R1084 Generalized abdominal pain: Secondary | ICD-10-CM | POA: Diagnosis not present

## 2013-11-27 LAB — URINE MICROSCOPIC-ADD ON

## 2013-11-27 LAB — URINALYSIS, ROUTINE W REFLEX MICROSCOPIC
Bilirubin Urine: NEGATIVE
Glucose, UA: NEGATIVE mg/dL
Hgb urine dipstick: NEGATIVE
Ketones, ur: NEGATIVE mg/dL
Nitrite: NEGATIVE
PH: 8 (ref 5.0–8.0)
PROTEIN: NEGATIVE mg/dL
Specific Gravity, Urine: 1.02 (ref 1.005–1.030)
Urobilinogen, UA: 0.2 mg/dL (ref 0.0–1.0)

## 2013-11-27 NOTE — Discharge Instructions (Signed)

## 2013-11-27 NOTE — MAU Provider Note (Signed)
History     CSN: [id number]  Arrival date and time: 11/27/13 1447   First Provider Initiated Contact with Patient 11/27/13 1502      Chief Complaint  Patient presents with  . Abdominal Pain   HPI   Ms. Rolene CourseJasmine Haworth is a 23 y.o. female G3P0020 at 5368w0d who presents with abdominal pain that started around 1 hour ago. She feels that the pain has gotten better since her arrival here; patient came by EMS. The pain is located in her lower stomach. 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 Last intercourse was yesterday. She currently rates her pain 7/10; the pain comes and goes.     RN note:   Expand All Collapse All   Pt presents via EMS complaining of abdominal pain that started 1400. Denies vaginal bleeding or discharge. Reports feeling fetal movement. Abdominal cerclage places 10/17/13. Hx of 2 losses at 16 and 17 weeks.         OB History    Gravida Para Term Preterm AB TAB SAB Ectopic Multiple Living   3 0   2  2   0      Past Medical History  Diagnosis Date  . Endometritis following delivery 12/03/2012  . Asherman syndrome     Past Surgical History  Procedure Laterality Date  . Dilation and curettage of uterus  01/26/2012    following a miscarriage  . Cervical cerclage N/A 11/10/2012    Procedure: CERCLAGE CERVICAL;  Surgeon: Oliver PilaKathy W Richardson, MD;  Location: [facility];  Service: Gynecology;  Laterality: N/A;  . Dilation and evacuation N/A 12/01/2012    Procedure: DILATATION AND EVACUATION;  Surgeon: Sherron MondayJody [name], MD;  Location: [facility];  Service: Gynecology;  Laterality: N/A;  . Cervical cerclage N/A 12/01/2012    Procedure: CERCLAGE CERVICAL REMOVAL;  Surgeon: Sherron MondayJody [name], MD;  Location: [facility];  Service: Gynecology;  Laterality: N/A;  . Dilation and evacuation N/A 12/03/2012    Procedure: DILATATION AND EVACUATION;  Surgeon: Sherron MondayJody [name], MD;  Location: [facility];  Service: Gynecology;   Laterality: N/A;  . Hysteroscopy w/d&c N/A 02/06/2013    Procedure:  SUCTION D&C HYSTEROSCOPY/LYSIS OF ADHESIONS/ INTRAOP HSG COOK UTERINE STENT PLACEMENT;  Surgeon: Fermin Schwabamer Yalcinkaya, MD;  Location: [facility];  Service: Gynecology;  Laterality: N/A;    Family History  Problem Relation Age of Onset  . Stroke Maternal Grandmother   . Heart disease Maternal Grandmother   . Cancer Maternal Grandfather     History  Substance Use Topics  . Smoking status: Never Smoker   . Smokeless tobacco: Never Used  . Alcohol Use: No    Allergies:  Allergies  Allergen Reactions  . Cinnamon Shortness Of Breath  . Penicillins Shortness Of Breath and Swelling  . Flagyl [Metronidazole] Other (See Comments)    Reports itching and bumps in mouth   . Tape Rash    Tape causes irritation on patient arms    Prescriptions prior to admission  Medication Sig Dispense Refill Last Dose  . magnesium hydroxide (MILK OF MAGNESIA) 800 MG/5ML suspension Take 15 mLs by mouth daily as needed for constipation.   Past Week at Unknown time  . oxycodone-acetaminophen (PERCOCET) 2.5-325 MG per tablet Take 1 tablet by mouth every 4 (four) hours as needed for pain.   10/27/2013 at 1200  . Prenatal Vit-Fe Fumarate-FA (PRENATAL MULTIVITAMIN) TABS tablet Take 1 tablet by mouth daily at 12  noon.   10/25/2013 at Unknown time    Review of Systems  Constitutional: Negative for fever and chills.  Gastrointestinal: Positive for abdominal pain. Negative for nausea and vomiting.  Genitourinary: Negative for dysuria, urgency, frequency and hematuria.   Physical Exam   Blood pressure 115/76, pulse 108, temperature 98 F (36.7 C), temperature source Oral, resp. rate 18, last menstrual period 07/10/2013.  Physical Exam  Constitutional: She is oriented to person, place, and time. She appears well-developed and well-nourished. No distress.  HENT:  Head: Normocephalic.  Eyes: Pupils are equal, round, and reactive to  light.  Neck: Neck supple.  Respiratory: Effort normal.  Musculoskeletal: Normal range of motion.  Neurological: She is alert and oriented to person, place, and time.  Skin: Skin is warm. She is not diaphoretic.  Psychiatric: Her behavior is normal.    MAU Course  Procedures  None  MDM +fetal heart tones  Patient declined speculum exam, patient declined cervical exam by NP  Patient requests I call her Dr. And inform her that she is here in the MAU. Per the RN the patient stated that the only reason she came to MAU was because she got pulled over for speeding and called 911 to get out of a speeding ticket.  Consulted with Dr. Senaida Oresichardson; patient declined exam today in MAU; patient is scheduled for an US tomorrow in the office. Per Dr. Senaida Oresichardson patient should be encouraged to keep her appointment tomorrow in the office.  Discussed the risks with the patient of leaving AMA specifically preterm labor-preterm delivery. Patient voices understand of risks.   Assessment and Plan   A: 1. Generalized abdominal pain in pregnancy.    P: Patient left the hospital AMA.  Follow up with Dr. As scheduled Preterm labor precautions.   Iona HansenJennifer Irene Rasch, NP 11/27/2013 3:50 PM

## 2013-11-27 NOTE — MAU Note (Signed)
Pt left AMA despite warnings from NP and explanation of risks of leaving. Pt verbalized understand and still left. Signed AMA paperwork and requested copy. Refused discharge vital signs

## 2013-11-27 NOTE — MAU Note (Signed)
Pt presents via EMS complaining of abdominal pain that started 1400. Denies vaginal bleeding or discharge. Reports feeling fetal movement. Abdominal cerclage places 10/17/13. Hx of 2 losses at 16 and 17 weeks.

## 2013-12-06 ENCOUNTER — Inpatient Hospital Stay (HOSPITAL_COMMUNITY)
Admission: AD | Admit: 2013-12-06 | Discharge: 2013-12-06 | Disposition: A | Discharge status: Home / Self Care | Payer: BC Managed Care – PPO | Source: Ambulatory Visit | Attending: Obstetrics and Gynecology | Admitting: Obstetrics and Gynecology

## 2013-12-06 ENCOUNTER — Encounter (HOSPITAL_COMMUNITY): Payer: Self-pay | Admitting: *Deleted

## 2013-12-06 DIAGNOSIS — O368120 Decreased fetal movements, second trimester, not applicable or unspecified: Secondary | ICD-10-CM | POA: Insufficient documentation

## 2013-12-06 DIAGNOSIS — Z3A21 21 weeks gestation of pregnancy: Secondary | ICD-10-CM | POA: Insufficient documentation

## 2013-12-06 DIAGNOSIS — O368123 Decreased fetal movements, second trimester, fetus 3: Secondary | ICD-10-CM

## 2013-12-06 HISTORY — DX: Incompetence of cervix uteri: N88.3

## 2013-12-06 HISTORY — DX: Polycystic ovarian syndrome: E28.2

## 2013-12-06 LAB — URINALYSIS, ROUTINE W REFLEX MICROSCOPIC
Bilirubin Urine: NEGATIVE
GLUCOSE, UA: NEGATIVE mg/dL
Ketones, ur: 40 mg/dL — AB
Nitrite: NEGATIVE
Protein, ur: NEGATIVE mg/dL
SPECIFIC GRAVITY, URINE: 1.025 (ref 1.005–1.030)
Urobilinogen, UA: 0.2 mg/dL (ref 0.0–1.0)
pH: 6.5 (ref 5.0–8.0)

## 2013-12-06 LAB — URINE MICROSCOPIC-ADD ON

## 2013-12-06 NOTE — Discharge Instructions (Signed)
Second Trimester of Pregnancy The second trimester is from week 13 through week 28, months 4 through 6. The second trimester is often a time when you feel your best. Your body has also adjusted to being pregnant, and you begin to feel better physically. Usually, morning sickness has lessened or quit completely, you may have more energy, and you may have an increase in appetite. The second trimester is also a time when the fetus is growing rapidly. At the end of the sixth month, the fetus is about 9 inches long and weighs about 1 pounds. You will likely begin to feel the baby move (quickening) between 18 and 20 weeks of the pregnancy. BODY CHANGES Your body goes through many changes during pregnancy. The changes vary from woman to woman.   Your weight will continue to increase. You will notice your lower abdomen bulging out.  You may begin to get stretch marks on your hips, abdomen, and breasts.  You may develop headaches that can be relieved by medicines approved by your health care provider.  You may urinate more often because the fetus is pressing on your bladder.  You may develop or continue to have heartburn as a result of your pregnancy.  You may develop constipation because certain hormones are causing the muscles that push waste through your intestines to slow down.  You may develop hemorrhoids or swollen, bulging veins (varicose veins).  You may have back pain because of the weight gain and pregnancy hormones relaxing your joints between the bones in your pelvis and as a result of a shift in weight and the muscles that support your balance.  Your breasts will continue to grow and be tender.  Your gums may bleed and may be sensitive to brushing and flossing.  Dark spots or blotches (chloasma, mask of pregnancy) may develop on your face. This will likely fade after the baby is born.  A dark line from your belly button to the pubic area (linea nigra) may appear. This will likely fade  after the baby is born.  You may have changes in your hair. These can include thickening of your hair, rapid growth, and changes in texture. Some women also have hair loss during or after pregnancy, or hair that feels dry or thin. Your hair will most likely return to normal after your baby is born. WHAT TO EXPECT AT YOUR PRENATAL VISITS During a routine prenatal visit:  You will be weighed to make sure you and the fetus are growing normally.  Your blood pressure will be taken.  Your abdomen will be measured to track your baby's growth.  The fetal heartbeat will be listened to.  Any test results from the previous visit will be discussed. Your health care provider may ask you:  How you are feeling.  If you are feeling the baby move.  If you have had any abnormal symptoms, such as leaking fluid, bleeding, severe headaches, or abdominal cramping.  If you have any questions. Other tests that may be performed during your second trimester include:  Blood tests that check for:  Low iron levels (anemia).  Gestational diabetes (between 24 and 28 weeks).  Rh antibodies.  Urine tests to check for infections, diabetes, or protein in the urine.  An ultrasound to confirm the proper growth and development of the baby.  An amniocentesis to check for possible genetic problems.  Fetal screens for spina bifida and Down syndrome. HOME CARE INSTRUCTIONS   Avoid all smoking, herbs, alcohol, and unprescribed   drugs. These chemicals affect the formation and growth of the baby.  Follow your health care provider's instructions regarding medicine use. There are medicines that are either safe or unsafe to take during pregnancy.  Exercise only as directed by your health care provider. Experiencing uterine cramps is a good sign to stop exercising.  Continue to eat regular, healthy meals.  Wear a good support bra for breast tenderness.  Do not use hot tubs, steam rooms, or saunas.  Wear your  seat belt at all times when driving.  Avoid raw meat, uncooked cheese, cat litter boxes, and soil used by cats. These carry germs that can cause birth defects in the baby.  Take your prenatal vitamins.  Try taking a stool softener (if your health care provider approves) if you develop constipation. Eat more high-fiber foods, such as fresh vegetables or fruit and whole grains. Drink plenty of fluids to keep your urine clear or pale yellow.  Take warm sitz baths to soothe any pain or discomfort caused by hemorrhoids. Use hemorrhoid cream if your health care provider approves.  If you develop varicose veins, wear support hose. Elevate your feet for 15 minutes, 3-4 times a day. Limit salt in your diet.  Avoid heavy lifting, wear low heel shoes, and practice good posture.  Rest with your legs elevated if you have leg cramps or low back pain.  Visit your dentist if you have not gone yet during your pregnancy. Use a soft toothbrush to brush your teeth and be gentle when you floss.  A sexual relationship may be continued unless your health care provider directs you otherwise.  Continue to go to all your prenatal visits as directed by your health care provider. SEEK MEDICAL CARE IF:   You have dizziness.  You have mild pelvic cramps, pelvic pressure, or nagging pain in the abdominal area.  You have persistent nausea, vomiting, or diarrhea.  You have a bad smelling vaginal discharge.  You have pain with urination. SEEK IMMEDIATE MEDICAL CARE IF:   You have a fever.  You are leaking fluid from your vagina.  You have spotting or bleeding from your vagina.  You have severe abdominal cramping or pain.  You have rapid weight gain or loss.  You have shortness of breath with chest pain.  You notice sudden or extreme swelling of your face, hands, ankles, feet, or legs.  You have not felt your baby move in over an hour.  You have severe headaches that do not go away with  medicine.  You have vision changes. Document Released: 12/21/2000 Document Revised: 01/01/2013 Document Reviewed: 02/28/2012 [employer] Patient Information 2015 [employer]. This information is not intended to replace advice given to you by your health care provider. Make sure you discuss any questions you have with your health care provider.  

## 2013-12-06 NOTE — MAU Provider Note (Signed)
History     CSN: [id number]  Arrival date and time: 12/06/13 1505   First Provider Initiated Contact with Patient 12/06/13 1558      Chief Complaint  Patient presents with  . Vaginal Bleeding  . Decreased Fetal Movement   HPI Comments: Erika Townsend CourseJasmine Townsend 23 y.o. [phone] presenst to MAU with no fetal movement x days. She is also complaining of blood when she wipes x 2 days. She has a low lying placenta, transabdominal cerclage and history of 2 second trimester losses at 16 and 17 weeks. She has an office visit on December 4th. She has cramp like pains that are 3/10 and she declines any medications.   Dr Senaida Oresichardson adds that she had an ultrasound for cervical length last week that was normal  Vaginal Bleeding Associated symptoms include abdominal pain.       Past Medical History  Diagnosis Date  . Endometritis following delivery 12/03/2012  . Asherman syndrome   . Incompetence of cervix   . PCOS (polycystic ovarian syndrome)     Past Surgical History  Procedure Laterality Date  . Dilation and curettage of uterus  01/26/2012    following a miscarriage  . Cervical cerclage N/A 11/10/2012    Procedure: CERCLAGE CERVICAL;  Surgeon: Oliver PilaKathy W Richardson, MD;  Location: [facility];  Service: Gynecology;  Laterality: N/A;  . Dilation and evacuation N/A 12/01/2012    Procedure: DILATATION AND EVACUATION;  Surgeon: Sherron MondayJody [name], MD;  Location: [facility];  Service: Gynecology;  Laterality: N/A;  . Cervical cerclage N/A 12/01/2012    Procedure: CERCLAGE CERVICAL REMOVAL;  Surgeon: Sherron MondayJody [name], MD;  Location: [facility];  Service: Gynecology;  Laterality: N/A;  . Dilation and evacuation N/A 12/03/2012    Procedure: DILATATION AND EVACUATION;  Surgeon: Sherron MondayJody [name], MD;  Location: [facility];  Service: Gynecology;  Laterality: N/A;  . Hysteroscopy w/d&c N/A 02/06/2013    Procedure:  SUCTION D&C HYSTEROSCOPY/LYSIS OF ADHESIONS/ INTRAOP HSG COOK UTERINE STENT PLACEMENT;  Surgeon: Fermin Schwabamer Yalcinkaya, MD;   Location: [facility];  Service: Gynecology;  Laterality: N/A;    Family History  Problem Relation Age of Onset  . Stroke Maternal Grandmother   . Heart disease Maternal Grandmother   . Cancer Maternal Grandfather     History  Substance Use Topics  . Smoking status: Never Smoker   . Smokeless tobacco: Never Used  . Alcohol Use: No    Allergies:  Allergies  Allergen Reactions  . Cinnamon Shortness Of Breath  . Penicillins Shortness Of Breath and Swelling  . Flagyl [Metronidazole] Other (See Comments)    Reports itching and bumps in mouth   . Tape Rash    Tape causes irritation on patient arms    Prescriptions prior to admission  Medication Sig Dispense Refill Last Dose  . pantoprazole (PROTONIX) 40 MG tablet Take 40 mg by mouth daily.   Past Month at Unknown time  . Prenatal Vit-Fe Fumarate-FA (PRENATAL MULTIVITAMIN) TABS tablet Take 1 tablet by mouth daily at 12 noon.   12/05/2013 at Unknown time  . magnesium hydroxide (MILK OF MAGNESIA) 800 MG/5ML suspension Take 15 mLs by mouth daily as needed for constipation.   prn  . oxycodone-acetaminophen (PERCOCET) 2.5-325 MG per tablet Take 1 tablet by mouth every 4 (four) hours as needed for pain.   prn    Review of Systems  Constitutional: Negative.   HENT: Negative.   Respiratory: Negative.   Cardiovascular: Negative.   Gastrointestinal: Positive for abdominal pain.  Genitourinary: Positive for vaginal bleeding.       No fetal movement and spotting  Musculoskeletal: Negative.   Skin: Negative.   Neurological: Negative.   Psychiatric/Behavioral: Negative.    Physical Exam   Blood pressure 135/67, pulse 107, temperature 98.7 F (37.1 C), temperature source Oral, resp. rate 18, weight 118.389 kg (261 lb), last menstrual period 07/10/2013.  Physical Exam  Constitutional: She is oriented to person, place, and time. She appears well-developed and well-nourished. No distress.  HENT:  Head: Normocephalic and  atraumatic.  Cardiovascular: Normal rate, regular rhythm and normal heart sounds.   Respiratory: Effort normal and breath sounds normal. No respiratory distress. She has no wheezes.  GI: Soft. Bowel sounds are normal. She exhibits no distension. There is no tenderness. There is no rebound.  Genitourinary:  Genital:external negative Vaginal:small amount white discharge Cervix:closed/ thick/long Bimanual:nontender   Musculoskeletal: Normal range of motion.  Neurological: She is alert and oriented to person, place, and time.  Skin: Skin is warm and dry.  Psychiatric: She has a normal mood and affect. Her behavior is normal. Judgment and thought content normal.   +FHT  MAU Course  Procedures  MDM  Spoke with Dr Senaida Oresichardson who advised pelvic exam, if bleeding do ultrasound, otherwise may discharge to home  Assessment and Plan   A: Decreased Fetal Movement  P: Reassurance Pelvic rest Keep appointment in office Return to MAU as needed   Carolynn ServeBarefoot, Kellis Mcadam Miller 12/06/2013, 4:10 PM

## 2013-12-06 NOTE — MAU Note (Addendum)
Felt weird yesterday, started bleeding yesterday- light pink- only when wiped.  Was heavier and bright  red today, passed 2 nickel sized clots this morning, now is back to pinkish.  Low lying placenta at 15wks. Had been feeling the baby move, now hasn't felt it in 2 days.  Has  A trans- abdominal cerclage. No sex in 3 wks.

## 2013-12-13 ENCOUNTER — Inpatient Hospital Stay (HOSPITAL_COMMUNITY)
Admission: AD | Admit: 2013-12-13 | Discharge: 2013-12-13 | Disposition: A | Discharge status: Home / Self Care | Payer: Medicaid Other | Source: Ambulatory Visit | Attending: Obstetrics and Gynecology | Admitting: Obstetrics and Gynecology

## 2013-12-13 ENCOUNTER — Encounter (HOSPITAL_COMMUNITY): Payer: Self-pay | Admitting: *Deleted

## 2013-12-13 DIAGNOSIS — R300 Dysuria: Secondary | ICD-10-CM | POA: Diagnosis present

## 2013-12-13 DIAGNOSIS — O2342 Unspecified infection of urinary tract in pregnancy, second trimester: Secondary | ICD-10-CM | POA: Diagnosis not present

## 2013-12-13 DIAGNOSIS — O09292 Supervision of pregnancy with other poor reproductive or obstetric history, second trimester: Secondary | ICD-10-CM

## 2013-12-13 DIAGNOSIS — Z3A20 20 weeks gestation of pregnancy: Secondary | ICD-10-CM | POA: Diagnosis not present

## 2013-12-13 LAB — URINALYSIS, ROUTINE W REFLEX MICROSCOPIC
Bilirubin Urine: NEGATIVE
Glucose, UA: NEGATIVE mg/dL
Ketones, ur: 15 mg/dL — AB
NITRITE: POSITIVE — AB
PROTEIN: 100 mg/dL — AB
Specific Gravity, Urine: >1.03 — ABNORMAL HIGH (ref 1.005–1.030)
UROBILINOGEN UA: 0.2 mg/dL (ref 0.0–1.0)
pH: 6.5 (ref 5.0–8.0)

## 2013-12-13 LAB — WET PREP, GENITAL
Clue Cells Wet Prep HPF POC: NONE SEEN
Trich, Wet Prep: NONE SEEN
Yeast Wet Prep HPF POC: NONE SEEN

## 2013-12-13 LAB — URINE MICROSCOPIC-ADD ON

## 2013-12-13 LAB — AMNISURE RUPTURE OF MEMBRANE (ROM) NOT AT ARMC: Amnisure ROM: NEGATIVE

## 2013-12-13 MED ORDER — PHENAZOPYRIDINE HCL 100 MG PO TABS
200.0000 mg | ORAL_TABLET | Freq: Once | ORAL | Status: AC
  Administered 2013-12-13: 200 mg via ORAL

## 2013-12-13 MED ORDER — PROMETHAZINE HCL 25 MG PO TABS: 25.0000 mg | ORAL_TABLET | Freq: Once | ORAL | Status: DC | PRN

## 2013-12-13 MED ORDER — SULFAMETHOXAZOLE-TRIMETHOPRIM 800-160 MG PO TABS
1.0000 | ORAL_TABLET | Freq: Once | ORAL | Status: AC
  Administered 2013-12-13: 1 via ORAL

## 2013-12-13 MED ORDER — PHENAZOPYRIDINE HCL 200 MG PO TABS: 200.0000 mg | ORAL_TABLET | Freq: Three times a day (TID) | ORAL | Status: DC | PRN

## 2013-12-13 NOTE — MAU Note (Signed)
Pt felt a gush of clear fluid at 0310. Pt also has felt nauseated and has not been able keep anything down since since 10am yesterday. Pt also reports burning w/ urination and frequency.

## 2013-12-13 NOTE — MAU Provider Note (Signed)
Chief Complaint:  Rupture of Membranes  First Provider Initiated Contact with Patient 12/13/13 [phone]     HPI: Erika Townsend is a 23 y.o. G3P0020 at 2420w4d who presents to maternity admissions reporting: 1. Gush of clear fluid at 0310 and small amount of leaking since then.  2. Dysuria, urgency, frequency x a few days.  3. Nausea since yesterday. Hasn't been able to eat or drink.   Denies fever, chills, abd pain, contractions, vaginal bleeding. Good fetal movement.   Pregnancy Course: Abd cerclage placed due to Hx of 2 second SABs due to incompetent cervix.   Past Medical History: Past Medical History  Diagnosis Date  . Endometritis following delivery 12/03/2012  . Asherman syndrome   . Incompetence of cervix   . PCOS (polycystic ovarian syndrome)     Past obstetric history: OB History  Gravida Para Term Preterm AB SAB TAB Ectopic Multiple Living  3 0   2 2    0    # Outcome Date GA Lbr Len/2nd Weight Sex Delivery Anes PTL Lv  3 Current           2 SAB 12/01/12 4269w5d 16:57 0.281 kg (9.9 oz) F  None  FD  1 SAB 1/14 17w             Past Surgical History: Past Surgical History  Procedure Laterality Date  . Dilation and curettage of uterus  01/26/2012    following a miscarriage  . Cervical cerclage N/A 11/10/2012    Procedure: CERCLAGE CERVICAL;  Surgeon: Oliver PilaKathy W Richardson, MD;  Location: [facility];  Service: Gynecology;  Laterality: N/A;  . Dilation and evacuation N/A 12/01/2012    Procedure: DILATATION AND EVACUATION;  Surgeon: Sherron MondayJody [name], MD;  Location: [facility];  Service: Gynecology;  Laterality: N/A;  . Cervical cerclage N/A 12/01/2012    Procedure: CERCLAGE CERVICAL REMOVAL;  Surgeon: Sherron MondayJody [name], MD;  Location: [facility];  Service: Gynecology;  Laterality: N/A;  . Dilation and evacuation N/A 12/03/2012    Procedure: DILATATION AND EVACUATION;  Surgeon: Sherron MondayJody [name], MD;  Location: [facility];  Service: Gynecology;  Laterality: N/A;  . Hysteroscopy w/d&c N/A 02/06/2013     Procedure:  SUCTION D&C HYSTEROSCOPY/LYSIS OF ADHESIONS/ INTRAOP HSG COOK UTERINE STENT PLACEMENT;  Surgeon: Fermin Schwabamer Yalcinkaya, MD;  Location: [facility];  Service: Gynecology;  Laterality: N/A;     Family History: Family History  Problem Relation Age of Onset  . Stroke Maternal Grandmother   . Heart disease Maternal Grandmother   . Cancer Maternal Grandfather     Social History: History  Substance Use Topics  . Smoking status: Never Smoker   . Smokeless tobacco: Never Used  . Alcohol Use: No    Allergies:  Allergies  Allergen Reactions  . Cinnamon Shortness Of Breath  . Penicillins Shortness Of Breath and Swelling  . Flagyl [Metronidazole] Other (See Comments)    Reports itching and bumps in mouth   . Tape Rash    Tape causes irritation on patient arms    Meds:  Prescriptions prior to admission  Medication Sig Dispense Refill Last Dose  . magnesium hydroxide (MILK OF MAGNESIA) 800 MG/5ML suspension Take 15 mLs by mouth daily as needed for constipation.   Past Month at Unknown time  . Prenatal Vit-Fe Fumarate-FA (PRENATAL MULTIVITAMIN) TABS tablet Take 1 tablet by mouth daily at 12 noon.   12/12/2013 at Unknown time  . oxycodone-acetaminophen (PERCOCET) 2.5-325 MG per tablet Take 1 tablet by mouth every 4 (  four) hours as needed for pain.   More than a month at Unknown time  . pantoprazole (PROTONIX) 40 MG tablet Take 40 mg by mouth daily.   More than a month at Unknown time    ROS: Pertinent positive findings in history of present illness. Neg for fever, chills, abd pain, contractions, vaginal discharge, flank pain, hematuria, vomiting or vaginal bleeding.  Physical Exam  Blood pressure 128/80, pulse 99, temperature 98.4 F (36.9 C), temperature source Oral, resp. rate 18, height 5\' 4"  (1.626 m), weight 117.482 kg (259 lb), last menstrual period 07/10/2013, SpO2 100 %. GENERAL: Well-developed, well-nourished female in no acute distress.  HEENT:  normocephalic HEART: normal rate RESP: normal effort ABDOMEN: Soft, non-tender, gravid appropriate for gestational age. No CVAT.  EXTREMITIES: Nontender, no edema NEURO: alert and oriented SPECULUM EXAM: NEFG, scant creamy, white, odorless discharge, neg pooling, no blood, cervix clean  Cervix visually closed.   FHT:  Baseline 158 By doppler.    Labs: Results for orders placed or performed during the hospital encounter of 12/13/13 (from the past 24 hour(s))  Urinalysis, Routine w reflex microscopic     Status: Abnormal   Collection Time: 12/13/13  3:30 AM  Result Value Ref Range   Color, Urine YELLOW YELLOW   APPearance CLOUDY (A) CLEAR   Specific Gravity, Urine >1.030 (H) 1.005 - 1.030   pH 6.5 5.0 - 8.0   Glucose, UA NEGATIVE NEGATIVE mg/dL   Hgb urine dipstick LARGE (A) NEGATIVE   Bilirubin Urine NEGATIVE NEGATIVE   Ketones, ur 15 (A) NEGATIVE mg/dL   Protein, ur 045100 (A) NEGATIVE mg/dL   Urobilinogen, UA 0.2 0.0 - 1.0 mg/dL   Nitrite POSITIVE (A) NEGATIVE   Leukocytes, UA MODERATE (A) NEGATIVE  Urine microscopic-add on     Status: Abnormal   Collection Time: 12/13/13  3:30 AM  Result Value Ref Range   Squamous Epithelial / LPF FEW (A) RARE   WBC, UA 3-6 <3 WBC/hpf   RBC / HPF 11-20 <3 RBC/hpf   Bacteria, UA FEW (A) RARE  Wet prep, genital     Status: Abnormal   Collection Time: 12/13/13  3:50 AM  Result Value Ref Range   Yeast Wet Prep HPF POC NONE SEEN NONE SEEN   Trich, Wet Prep NONE SEEN NONE SEEN   Clue Cells Wet Prep HPF POC NONE SEEN NONE SEEN   WBC, Wet Prep HPF POC MODERATE (A) NONE SEEN  Amnisure rupture of membrane (rom)     Status: None   Collection Time: 12/13/13  4:35 AM  Result Value Ref Range   Amnisure ROM NEGATIVE   Neg fern  Imaging:  No results found.   MAU Course: Pyridium, Bactrim given.   Assessment: 1. UTI in pregnancy, second trimester   2. History of incompetent cervix, currently pregnant in second trimester     Plan: Discharge  home in stable condition per consult w/ Dr. Jackelyn KnifeMeisinger.  Preterm labor precautions. Pyelo precautions.  Pick up Bactrim Rx from pharmacy. Push fluids. Use antiemetics PRN.  Follow-up Information    Follow up with [facility] On 12/16/2013.   Why:  As scheduled or As needed if symptoms worsen   Contact information:   [address]  [address] [place] [id number] [phone]       [facility].   Why:  As needed if symptoms worsen   Contact information:   [address] [phone] mc  [place] [address] [phone]        Medication List    TAKE these medications        magnesium hydroxide 800 MG/5ML suspension  Commonly known as:  MILK OF MAGNESIA  Take 15 mLs by mouth daily as needed for constipation.     oxycodone-acetaminophen 2.5-325 MG per tablet  Commonly known as:  PERCOCET  Take 1 tablet by mouth every 4 (four) hours as needed for pain.     pantoprazole 40 MG tablet  Commonly known as:  PROTONIX  Take 40 mg by mouth daily.     phenazopyridine 200 MG tablet  Commonly known as:  PYRIDIUM  Take 1 tablet (200 mg total) by mouth 3 (three) times daily as needed for pain.     prenatal multivitamin Tabs tablet  Take 1 tablet by mouth daily at 12 noon.        [place], CNM 12/13/2013 4:56 AM

## 2013-12-13 NOTE — MAU Note (Signed)
Pt presents with complaint that her water broke about 15 minutes ago. States she has been voiding small amounts and having pain with urination all day.

## 2013-12-13 NOTE — Discharge Instructions (Signed)
Pregnancy and Urinary Tract Infection °A urinary tract infection (UTI) is a bacterial infection of the urinary tract. Infection of the urinary tract can include the ureters, kidneys (pyelonephritis), bladder (cystitis), and urethra (urethritis). All pregnant women should be screened for bacteria in the urinary tract. Identifying and treating a UTI will decrease the risk of preterm labor and developing more serious infections in both the mother and baby. °CAUSES °Bacteria germs cause almost all UTIs.  °RISK FACTORS °Many factors can increase your chances of getting a UTI during pregnancy. These include: °· Having a short urethra. °· Poor toilet and hygiene habits. °· Sexual intercourse. °· Blockage of urine along the urinary tract. °· Problems with the pelvic muscles or nerves. °· Diabetes. °· Obesity. °· Bladder problems after having several children. °· Previous history of UTI. °SIGNS AND SYMPTOMS  °· Pain, burning, or a stinging feeling when urinating. °· Suddenly feeling the need to urinate right away (urgency). °· Loss of bladder control (urinary incontinence). °· Frequent urination, more than is common with pregnancy. °· Lower abdominal or back discomfort. °· Cloudy urine. °· Blood in the urine (hematuria). °· Fever.  °When the kidneys are infected, the symptoms may be: °· Back pain. °· Flank pain on the right side more so than the left. °· Fever. °· Chills. °· Nausea. °· Vomiting. °DIAGNOSIS  °A urinary tract infection is usually diagnosed through urine tests. Additional tests and procedures are sometimes done. These may include: °· Ultrasound exam of the kidneys, ureters, bladder, and urethra. °· Looking in the bladder with a lighted tube (cystoscopy). °TREATMENT °Typically, UTIs can be treated with antibiotic medicines.  °HOME CARE INSTRUCTIONS  °· Only take over-the-counter or prescription medicines as directed by your health care provider. If you were prescribed antibiotics, take them as directed. Finish  them even if you start to feel better. °· Drink enough fluids to keep your urine clear or pale yellow. °· Do not have sexual intercourse until the infection is gone and your health care provider says it is okay. °· Make sure you are tested for UTIs throughout your pregnancy. These infections often come back.  °Preventing a UTI in the Future °· Practice good toilet habits. Always wipe from front to back. Use the tissue only once. °· Do not hold your urine. Empty your bladder as soon as possible when the urge comes. °· Do not douche or use deodorant sprays. °· Wash with soap and warm water around the genital area and the anus. °· Empty your bladder before and after sexual intercourse. °· Wear underwear with a cotton crotch. °· Avoid caffeine and carbonated drinks. They can irritate the bladder. °· Drink cranberry juice or take cranberry pills. This may decrease the risk of getting a UTI. °· Do not drink alcohol. °· Keep all your appointments and tests as scheduled.  °SEEK MEDICAL CARE IF:  °· Your symptoms get worse. °· You are still having fevers 2 or more days after treatment begins. °· You have a rash. °· You feel that you are having problems with medicines prescribed. °· You have abnormal vaginal discharge. °SEEK IMMEDIATE MEDICAL CARE IF:  °· You have back or flank pain. °· You have chills. °· You have blood in your urine. °· You have nausea and vomiting. °· You have contractions of your uterus. °· You have a gush of fluid from the vagina. °MAKE SURE YOU: °· Understand these instructions.   °· Will watch your condition.   °· Will get help right away if you are not doing   well or get worse.   °Document Released: 04/23/2010 Document Revised: 10/17/2012 Document Reviewed: 07/26/2012 °ExitCare® Patient Information ©2015 [employer]. This information is not intended to replace advice given to you by your health care provider. Make sure you discuss any questions you have with your health care provider. ° °

## 2013-12-14 LAB — GC/CHLAMYDIA PROBE AMP
CT PROBE, AMP APTIMA: NEGATIVE
GC PROBE AMP APTIMA: NEGATIVE

## 2013-12-15 LAB — CULTURE, OB URINE: Special Requests: NORMAL

## 2014-01-11 ENCOUNTER — Encounter (HOSPITAL_COMMUNITY): Payer: Self-pay

## 2014-01-11 ENCOUNTER — Inpatient Hospital Stay (HOSPITAL_COMMUNITY)
Admission: AD | Admit: 2014-01-11 | Discharge: 2014-01-12 | Disposition: A | Discharge status: Home / Self Care | Payer: BC Managed Care – PPO | Source: Ambulatory Visit | Attending: Obstetrics and Gynecology | Admitting: Obstetrics and Gynecology

## 2014-01-11 DIAGNOSIS — O23592 Infection of other part of genital tract in pregnancy, second trimester: Secondary | ICD-10-CM | POA: Diagnosis not present

## 2014-01-11 DIAGNOSIS — O4703 False labor before 37 completed weeks of gestation, third trimester: Secondary | ICD-10-CM | POA: Diagnosis not present

## 2014-01-11 DIAGNOSIS — O4440 Low lying placenta NOS or without hemorrhage, unspecified trimester: Secondary | ICD-10-CM | POA: Insufficient documentation

## 2014-01-11 DIAGNOSIS — N760 Acute vaginitis: Secondary | ICD-10-CM | POA: Insufficient documentation

## 2014-01-11 DIAGNOSIS — O4700 False labor before 37 completed weeks of gestation, unspecified trimester: Secondary | ICD-10-CM | POA: Insufficient documentation

## 2014-01-11 DIAGNOSIS — O4702 False labor before 37 completed weeks of gestation, second trimester: Secondary | ICD-10-CM

## 2014-01-11 DIAGNOSIS — R109 Unspecified abdominal pain: Secondary | ICD-10-CM | POA: Diagnosis present

## 2014-01-11 DIAGNOSIS — O4402 Placenta previa specified as without hemorrhage, second trimester: Secondary | ICD-10-CM | POA: Insufficient documentation

## 2014-01-11 DIAGNOSIS — Z3A24 24 weeks gestation of pregnancy: Secondary | ICD-10-CM | POA: Insufficient documentation

## 2014-01-11 DIAGNOSIS — O479 False labor, unspecified: Secondary | ICD-10-CM | POA: Insufficient documentation

## 2014-01-11 DIAGNOSIS — O3432 Maternal care for cervical incompetence, second trimester: Secondary | ICD-10-CM | POA: Diagnosis not present

## 2014-01-11 DIAGNOSIS — B9689 Other specified bacterial agents as the cause of diseases classified elsewhere: Secondary | ICD-10-CM | POA: Diagnosis not present

## 2014-01-11 DIAGNOSIS — O09292 Supervision of pregnancy with other poor reproductive or obstetric history, second trimester: Secondary | ICD-10-CM

## 2014-01-11 LAB — URINALYSIS, ROUTINE W REFLEX MICROSCOPIC
Bilirubin Urine: NEGATIVE
GLUCOSE, UA: NEGATIVE mg/dL
HGB URINE DIPSTICK: NEGATIVE
Ketones, ur: NEGATIVE mg/dL
Leukocytes, UA: NEGATIVE
Nitrite: NEGATIVE
PROTEIN: NEGATIVE mg/dL
SPECIFIC GRAVITY, URINE: 1.02 (ref 1.005–1.030)
Urobilinogen, UA: 1 mg/dL (ref 0.0–1.0)
pH: 7 (ref 5.0–8.0)

## 2014-01-11 MED ORDER — METOCLOPRAMIDE HCL 5 MG/ML IJ SOLN
10.0000 mg | Freq: Once | INTRAMUSCULAR | Status: AC
  Administered 2014-01-12: 10 mg via INTRAMUSCULAR
  Filled 2014-01-11: qty 2

## 2014-01-11 NOTE — MAU Note (Signed)
Pt states she is having painless abdominal tightening that makes her stop walking. Denies vaginal bleeding or discharge. Reports good fetal movement.

## 2014-01-11 NOTE — MAU Provider Note (Signed)
Chief Complaint:  Abdominal Pain   First Provider Initiated Contact with Patient 01/11/14 2326     HPI: Erika Townsend is a 24 y.o. G3P0020 at [redacted]w[redacted]d who presents to maternity admissions reporting preterm contractions and nausea. Has not been able to drink and eat well x 2 days. Hx second trimester loss x 2 from incompetent cervix. Has abdominal cerclage.  Denies fever, chills, eakage of fluid or vaginal bleeding. Good fetal movement.   Past Medical History: Past Medical History  Diagnosis Date  . Endometritis following delivery 12/03/2012  . Asherman syndrome   . Incompetence of cervix   . PCOS (polycystic ovarian syndrome)     Past obstetric history: OB History  Gravida Para Term Preterm AB SAB TAB Ectopic Multiple Living  3 0   2 2    0    # Outcome Date GA Lbr Len/2nd Weight Sex Delivery Anes PTL Lv  3 Current           2 SAB 12/01/12 [redacted]w[redacted]d 16:57 0.281 kg (9.9 oz) F  None  FD  1 SAB 01/26/12 [redacted]w[redacted]d      Y      Complications: Incompetent cervix      Past Surgical History: Past Surgical History  Procedure Laterality Date  . Dilation and curettage of uterus  01/26/2012    following a miscarriage  . Cervical cerclage N/A 11/10/2012    Procedure: CERCLAGE CERVICAL;  Surgeon: Oliver Pila, MD;  Location: [facility];  Service: Gynecology;  Laterality: N/A;  . Dilation and evacuation N/A 12/01/2012    Procedure: DILATATION AND EVACUATION;  Surgeon: Sherron Monday, MD;  Location: [facility];  Service: Gynecology;  Laterality: N/A;  . Cervical cerclage N/A 12/01/2012    Procedure: CERCLAGE CERVICAL REMOVAL;  Surgeon: Sherron Monday, MD;  Location: [facility];  Service: Gynecology;  Laterality: N/A;  . Dilation and evacuation N/A 12/03/2012    Procedure: DILATATION AND EVACUATION;  Surgeon: Sherron Monday, MD;  Location: [facility];  Service: Gynecology;  Laterality: N/A;  . Hysteroscopy w/d&c N/A 02/06/2013    Procedure:  SUCTION D&C HYSTEROSCOPY/LYSIS OF ADHESIONS/ INTRAOP HSG COOK UTERINE STENT  PLACEMENT;  Surgeon: Fermin Schwab, MD;  Location: [facility];  Service: Gynecology;  Laterality: N/A;     Family History: Family History  Problem Relation Age of Onset  . Stroke Maternal Grandmother   . Heart disease Maternal Grandmother   . Cancer Maternal Grandfather     Social History: History  Substance Use Topics  . Smoking status: Never Smoker   . Smokeless tobacco: Never Used  . Alcohol Use: No    Allergies:  Allergies  Allergen Reactions  . Cinnamon Shortness Of Breath  . Penicillins Shortness Of Breath and Swelling  . Flagyl [Metronidazole] Other (See Comments)    Reports itching and bumps in mouth   . Tape Rash    Tape causes irritation on patient arms    Meds:  Prescriptions prior to admission  Medication Sig Dispense Refill Last Dose  . magnesium hydroxide (MILK OF MAGNESIA) 800 MG/5ML suspension Take 15 mLs by mouth daily as needed for constipation.   Past Month at Unknown time  . oxycodone-acetaminophen (PERCOCET) 2.5-325 MG per tablet Take 1 tablet by mouth every 4 (four) hours as needed for pain.   More than a month at Unknown time  . pantoprazole (PROTONIX) 40 MG tablet Take 40 mg by mouth daily.   More than a month at Unknown time  . phenazopyridine (PYRIDIUM) 200  MG tablet Take 1 tablet (200 mg total) by mouth 3 (three) times daily as needed for pain. 12 tablet 0   . Prenatal Vit-Fe Fumarate-FA (PRENATAL MULTIVITAMIN) TABS tablet Take 1 tablet by mouth daily at 12 noon.   12/12/2013 at Unknown time  . promethazine (PHENERGAN) 25 MG tablet Take 1 tablet (25 mg total) by mouth once as needed for nausea or vomiting. 30 tablet 0     ROS: Pertinent positive findings in history of present illness. Neg for fever, chills, urinary complaints, vomiting, diarrhea, constiption, VB, vaginal discharge.   Physical Exam  Blood pressure 136/82, pulse 94, temperature 98 F (36.7 C), temperature source Oral, resp. rate 18, last menstrual period  07/10/2013. GENERAL: Well-developed, well-nourished female in no acute distress.  HEENT: normocephalic HEART: normal rate RESP: normal effort ABDOMEN: Soft, non-tender, gravid appropriate for gestational age EXTREMITIES: Nontender, no edema NEURO: alert and oriented SPECULUM EXAM: NEFG, physiologic discharge, no blood, cervix clean  Deferred due Hx Low lying placenta at last [place].   FHT:  Baseline 145 , moderate variability, accelerations present, no decelerations Contractions: UI   Labs: Results for orders placed or performed during the hospital encounter of 01/11/14 (from the past 24 hour(s))  Urinalysis, Routine w reflex microscopic     Status: None   Collection Time: 01/11/14 10:10 PM  Result Value Ref Range   Color, Urine YELLOW YELLOW   APPearance CLEAR CLEAR   Specific Gravity, Urine 1.020 1.005 - 1.030   pH 7.0 5.0 - 8.0   Glucose, UA NEGATIVE NEGATIVE mg/dL   Hgb urine dipstick NEGATIVE NEGATIVE   Bilirubin Urine NEGATIVE NEGATIVE   Ketones, ur NEGATIVE NEGATIVE mg/dL   Protein, ur NEGATIVE NEGATIVE mg/dL   Urobilinogen, UA 1.0 0.0 - 1.0 mg/dL   Nitrite NEGATIVE NEGATIVE   Leukocytes, UA NEGATIVE NEGATIVE    Imaging:  Cervical length 3.4 cm.  Placenta posterior, above os.   MAU Course: UC's improved w/ Reglan and PO fluids.   Assessment: 1. BV (bacterial vaginosis)   2. Preterm contractions, second trimester   3. History of incompetent cervix, currently pregnant in second trimester   4. Low lying placenta without hemorrhage, antepartum, second trimester    Plan: Discharge home in stable condition,  Preterm labor precautions and fetal kick counts Push fluids. Declines Reglan Rx.  Follow-up Information    Follow up with MEISINGER,TODD D, MD.   Specialty:  Obstetrics and Gynecology   Why:  As scheduled or  As needed if symptoms worsen   Contact information:   [address] [place] [address] [phone]       Follow up with [facility]  [facility] [place] [facility].   Why:  As needed in emergencies   Contact information:   [address] [id number] mc [place] [address] [phone]        Medication List    TAKE these medications        clindamycin 300 MG capsule  Commonly known as:  CLEOCIN  Take 1 capsule (300 mg total) by mouth 3 (three) times daily.     magnesium hydroxide 800 MG/5ML suspension  Commonly known as:  MILK OF MAGNESIA  Take 15 mLs by mouth daily as needed for constipation.     oxycodone-acetaminophen 2.5-325 MG per tablet  Commonly known as:  PERCOCET  Take 1 tablet by mouth every 4 (four) hours as needed for pain.     pantoprazole 40 MG tablet  Commonly known  as:  PROTONIX  Take 40 mg by mouth daily.     phenazopyridine 200 MG tablet  Commonly known as:  PYRIDIUM  Take 1 tablet (200 mg total) by mouth 3 (three) times daily as needed for pain.     prenatal multivitamin Tabs tablet  Take 1 tablet by mouth daily at 12 noon.     promethazine 25 MG tablet  Commonly known as:  PHENERGAN  Take 1 tablet (25 mg total) by mouth once as needed for nausea or vomiting.       [place], [place] 01/11/2014 11:24 PM

## 2014-01-12 ENCOUNTER — Inpatient Hospital Stay (HOSPITAL_COMMUNITY): Payer: BC Managed Care – PPO

## 2014-01-12 DIAGNOSIS — O09292 Supervision of pregnancy with other poor reproductive or obstetric history, second trimester: Secondary | ICD-10-CM | POA: Insufficient documentation

## 2014-01-12 DIAGNOSIS — O23592 Infection of other part of genital tract in pregnancy, second trimester: Secondary | ICD-10-CM

## 2014-01-12 DIAGNOSIS — O4440 Low lying placenta NOS or without hemorrhage, unspecified trimester: Secondary | ICD-10-CM | POA: Insufficient documentation

## 2014-01-12 DIAGNOSIS — O4700 False labor before 37 completed weeks of gestation, unspecified trimester: Secondary | ICD-10-CM | POA: Insufficient documentation

## 2014-01-12 DIAGNOSIS — Z3A24 24 weeks gestation of pregnancy: Secondary | ICD-10-CM

## 2014-01-12 DIAGNOSIS — N760 Acute vaginitis: Secondary | ICD-10-CM | POA: Diagnosis not present

## 2014-01-12 DIAGNOSIS — B9689 Other specified bacterial agents as the cause of diseases classified elsewhere: Secondary | ICD-10-CM | POA: Diagnosis not present

## 2014-01-12 DIAGNOSIS — O479 False labor, unspecified: Secondary | ICD-10-CM | POA: Insufficient documentation

## 2014-01-12 DIAGNOSIS — O4703 False labor before 37 completed weeks of gestation, third trimester: Secondary | ICD-10-CM | POA: Diagnosis not present

## 2014-01-12 DIAGNOSIS — O3433 Maternal care for cervical incompetence, third trimester: Secondary | ICD-10-CM

## 2014-01-12 DIAGNOSIS — O4403 Placenta previa specified as without hemorrhage, third trimester: Secondary | ICD-10-CM

## 2014-01-12 LAB — WET PREP, GENITAL
TRICH WET PREP: NONE SEEN
Yeast Wet Prep HPF POC: NONE SEEN

## 2014-01-12 MED ORDER — CLINDAMYCIN HCL 300 MG PO CAPS: 300.0000 mg | ORAL_CAPSULE | Freq: Three times a day (TID) | ORAL | Status: DC

## 2014-01-12 NOTE — Discharge Instructions (Signed)
Bacterial Vaginosis Bacterial vaginosis is a vaginal infection that occurs when the normal balance of bacteria in the vagina is disrupted. It results from an overgrowth of certain bacteria. This is the most common vaginal infection in women of childbearing age. Treatment is important to prevent complications, especially in pregnant women, as it can cause a premature delivery. CAUSES  Bacterial vaginosis is caused by an increase in harmful bacteria that are normally present in smaller amounts in the vagina. Several different kinds of bacteria can cause bacterial vaginosis. However, the reason that the condition develops is not fully understood. RISK FACTORS Certain activities or behaviors can put you at an increased risk of developing bacterial vaginosis, including:  Having a new sex partner or multiple sex partners.  Douching.  Using an intrauterine device (IUD) for contraception. Women do not get bacterial vaginosis from toilet seats, bedding, swimming pools, or contact with objects around them. SIGNS AND SYMPTOMS  Some women with bacterial vaginosis have no signs or symptoms. Common symptoms include:  Grey vaginal discharge.  A fishlike odor with discharge, especially after sexual intercourse.  Itching or burning of the vagina and vulva.  Burning or pain with urination. DIAGNOSIS  Your health care provider will take a medical history and examine the vagina for signs of bacterial vaginosis. A sample of vaginal fluid may be taken. Your health care provider will look at this sample under a microscope to check for bacteria and abnormal cells. A vaginal pH test may also be done.  TREATMENT  Bacterial vaginosis may be treated with antibiotic medicines. These may be given in the form of a pill or a vaginal cream. A second round of antibiotics may be prescribed if the condition comes back after treatment.  HOME CARE INSTRUCTIONS   Only take over-the-counter or prescription medicines as  directed by your health care provider.  If antibiotic medicine was prescribed, take it as directed. Make sure you finish it even if you start to feel better.  Do not have sex until treatment is completed.  Tell all sexual partners that you have a vaginal infection. They should see their health care provider and be treated if they have problems, such as a mild rash or itching.  Practice safe sex by using condoms and only having one sex partner. SEEK MEDICAL CARE IF:   Your symptoms are not improving after 3 days of treatment.  You have increased discharge or pain.  You have a fever. MAKE SURE YOU:   Understand these instructions.  Will watch your condition.  Will get help right away if you are not doing well or get worse. FOR MORE INFORMATION  Centers for Disease Control and Prevention, Division of STD Prevention: SolutionApps.co.za American Sexual Health Association (ASHA): www.ashastd.org  Document Released: 12/27/2004 Document Revised: 10/17/2012 Document Reviewed: 08/08/2012 [facility] Patient Information 2015 [place], [place]. This information is not intended to replace advice given to you by your health care provider. Make sure you discuss any questions you have with your health care provider.  Preterm Birth Preterm birth is a birth that happens before 37 weeks of pregnancy. Most pregnancies last about 39-41 weeks. Every week in the womb is important and is beneficial to the health of the infant. Infants born before 37 weeks of pregnancy are at a higher risk for complications. Depending on when the infant was born, he or she may be:  Late preterm. Born between 32 weeks and 37 weeks of pregnancy.  Very preterm. Born at less than 32 weeks of  pregnancy.  Extremely preterm. Born at less than 25 weeks of pregnancy. The earlier a baby is born, the more likely the child will have issues related to prematurity. Complications and problems that can be seen in infants born too early  include:  Problems breathing (respiratory distress syndrome).  Low birth weight.  Problems feeding.  Sleeping problems.  Yellowing of the skin (jaundice).  Infections such as pneumonia. Babies born very preterm or extremely preterm are at risk for more serious medical issues. These include:  More severe breathing issues.  Eyesight issues.  Brain development issues (intraventricular hemorrhage).  Behavioral and emotional development issues.  Growth and developmental delays.  Cerebral palsy.  Serious feeding or bowel complications (necrotizing enterocolitis). CAUSES  There are two broad categories of preterm birth.  Spontaneous preterm birth. This is a birth resulting from preterm labor (not medically induced) or preterm premature rupture of membranes (PPROM).  Indicated preterm birth. This is a birth resulting from labor being medically induced due to health, personal, or social reasons. RISK FACTORS Preterm birth may be related to certain medical conditions, lifestyle factors, or demographic factors encountered by the mother or fetus.  Medical conditions include:  Multiple gestations (twins, triplets, and so on).  Infection.  Diabetes.  Heart disease.  Kidney disease.  Cervical or uterine abnormalities.  Being underweight.  High blood pressure or preeclampsia.  Premature rupture of membranes (PROM).  Birth defects in the fetus.  Lifestyle factors include:  Poor prenatal care.  Poor nutrition or anemia.  Cigarette smoking.  Consuming alcohol.  High levels of stress and lack of social or emotional support.  Exposure to chemical or environmental toxins.  Substance abuse.  Demographic factors include:  African-American ethnicity.  Age (younger than 55 or older than 25 years of age).  Low socioeconomic status. Women with a history of preterm labor or who become pregnant within 16 months of giving birth are also at increased risk for  preterm birth. DIAGNOSIS  Your health care provider may request additional tests to diagnose underlying complications resulting from preterm birth. Tests on the infant may include:  Physical exam.  Blood tests.  Chest X-rays.  Heart-lung monitoring. TREATMENT  After birth, special care will be taken to assess any problems or complications for the infant. Supportive care will be provided for the infant. Treatment depends on what problems are present and any complications that develop. Some preterm infants are cared for in a neonatal intensive care unit. In general, care may include:  Maintaining temperature and oxygen in a clear heated box (baby isolette).  Monitoring the infant's heart rate, breathing, and level of oxygen in the blood.  Monitoring for signs of infection and, if needed, giving IV antibiotic medicine.  Inserting a feeding tube (nose, mouth) or giving IV nutrition if unable to feed.  Inserting a breathing tube (ventilation).  Respiration support (continuous positive airway pressure [CPAP] or oxygen). Treatment will change as the infant builds up strength and is able to breathe and eat on his or her own. For some infants, no special treatment is necessary. Parents may be educated on the potential health risks of prematurity to the infant. HOME CARE INSTRUCTIONS  Understand your infant's special conditions and needs. It may be reassuring to learn about infant CPR.  Monitor your infant in the car seat until he or she grows and matures. Infant car seats can cause breathing difficulties for preterm infants.  Keep your infant warm. Dress your infant in layers and keep him or her  away from drafts, especially in cold months of the year.  Wash your hands thoroughly after going to the bathroom or changing a diaper. Late preterm infants may be more prone to infection.  Follow all your health care provider's instructions for providing support and care to your preterm  infant.  Get support from organizations and groups that understand your challenges.  Follow up with your infant's health care provider as directed. Prevention There are some things you can do to help lower your risk of having a preterm infant in the future. These include:  Good prenatal care throughout the entire pregnancy. See a health care provider regularly for advice and tests.  Management of underlying medical conditions.  Proper self-care and lifestyle changes.  Proper diet and weight control.  Watching for signs of various infections. SEEK MEDICAL CARE IF:  Your infant has feeding difficulties.  Your infant has sleeping difficulties.  Your infant has breathing difficulties.  Your infant's skin starts to look yellow.  Your infant shows signs of infection, such as a stuffy nose, fever, crying, or bluish color of the skin. FOR MORE INFORMATION March [employer]: www.marchofdimes.com Prematurity.org: www.prematurity.org Document Released: 03/19/2003 Document Revised: 10/17/2012 Document Reviewed: 07/26/2012 [facility] Patient Information 2015 [place], [place]. This information is not intended to replace advice given to you by your health care provider. Make sure you discuss any questions you have with your health care provider.

## 2014-01-15 LAB — GC/CHLAMYDIA PROBE AMP
CT Probe RNA: NEGATIVE
GC Probe RNA: NEGATIVE

## 2014-01-22 ENCOUNTER — Inpatient Hospital Stay (HOSPITAL_COMMUNITY)
Admission: AD | Admit: 2014-01-22 | Discharge: 2014-01-22 | Disposition: A | Discharge status: Home / Self Care | Payer: Managed Care, Other (non HMO) | Source: Ambulatory Visit | Attending: Obstetrics and Gynecology | Admitting: Obstetrics and Gynecology

## 2014-01-22 ENCOUNTER — Encounter (HOSPITAL_COMMUNITY): Payer: Self-pay | Admitting: *Deleted

## 2014-01-22 DIAGNOSIS — Z3A26 26 weeks gestation of pregnancy: Secondary | ICD-10-CM

## 2014-01-22 DIAGNOSIS — O368120 Decreased fetal movements, second trimester, not applicable or unspecified: Secondary | ICD-10-CM

## 2014-01-22 DIAGNOSIS — Z3689 Encounter for other specified antenatal screening: Secondary | ICD-10-CM

## 2014-01-22 DIAGNOSIS — O3432 Maternal care for cervical incompetence, second trimester: Secondary | ICD-10-CM | POA: Insufficient documentation

## 2014-01-22 LAB — URINALYSIS, ROUTINE W REFLEX MICROSCOPIC
BILIRUBIN URINE: NEGATIVE
Glucose, UA: NEGATIVE mg/dL
KETONES UR: NEGATIVE mg/dL
Leukocytes, UA: NEGATIVE
Nitrite: NEGATIVE
PH: 7.5 (ref 5.0–8.0)
Protein, ur: NEGATIVE mg/dL
SPECIFIC GRAVITY, URINE: 1.015 (ref 1.005–1.030)
UROBILINOGEN UA: 0.2 mg/dL (ref 0.0–1.0)

## 2014-01-22 LAB — URINE MICROSCOPIC-ADD ON

## 2014-01-22 NOTE — MAU Note (Signed)
No movement in 24 hrs.  Has been having Pelvic pain, mainly when she walks.  Denies GI or GU complaints.

## 2014-01-22 NOTE — Progress Notes (Signed)
FHT from today reviewed.  Reactive NST for 26 weeks, no significant decels, no regular ctx.

## 2014-01-22 NOTE — Discharge Instructions (Signed)

## 2014-01-22 NOTE — MAU Provider Note (Signed)
History     CSN: [id number]  Arrival date and time: 01/22/14 4098   First Provider Initiated Contact with Patient 01/22/14 0751      Chief Complaint  Patient presents with  . Decreased Fetal Movement  . Pelvic Pain   HPI  Ms. Erika Townsend is a 24 y.o. G3P0020 at [redacted]w[redacted]d who presents to MAU today with complaint of no fetal movement since last night. She states pelvic pain with walking only. She states that this has been going on for weeks and that the office is aware. She denies abdominal pain, contractions, vaginal bleeding, discharge or LOF. She states a history of cervical incompetence and has an abdominal cerclage that was placed by Dr. April Manson.   OB History    Gravida Para Term Preterm AB TAB SAB Ectopic Multiple Living   3 0   2  2   0      Past Medical History  Diagnosis Date  . Endometritis following delivery 12/03/2012  . Asherman syndrome   . Incompetence of cervix   . PCOS (polycystic ovarian syndrome)     Past Surgical History  Procedure Laterality Date  . Dilation and curettage of uterus  01/26/2012    following a miscarriage  . Cervical cerclage N/A 11/10/2012    Procedure: CERCLAGE CERVICAL;  Surgeon: Oliver Pila, MD;  Location: [facility];  Service: Gynecology;  Laterality: N/A;  . Dilation and evacuation N/A 12/01/2012    Procedure: DILATATION AND EVACUATION;  Surgeon: Sherron Monday, MD;  Location: [facility];  Service: Gynecology;  Laterality: N/A;  . Cervical cerclage N/A 12/01/2012    Procedure: CERCLAGE CERVICAL REMOVAL;  Surgeon: Sherron Monday, MD;  Location: [facility];  Service: Gynecology;  Laterality: N/A;  . Dilation and evacuation N/A 12/03/2012    Procedure: DILATATION AND EVACUATION;  Surgeon: Sherron Monday, MD;  Location: [facility];  Service: Gynecology;  Laterality: N/A;  . Hysteroscopy w/d&c N/A 02/06/2013    Procedure:  SUCTION D&C HYSTEROSCOPY/LYSIS OF ADHESIONS/ INTRAOP HSG COOK UTERINE STENT PLACEMENT;  Surgeon: Fermin Schwab, MD;  Location:  [facility];  Service: Gynecology;  Laterality: N/A;    Family History  Problem Relation Age of Onset  . Stroke Maternal Grandmother   . Heart disease Maternal Grandmother   . Cancer Maternal Grandfather     History  Substance Use Topics  . Smoking status: Never Smoker   . Smokeless tobacco: Never Used  . Alcohol Use: No    Allergies:  Allergies  Allergen Reactions  . Cinnamon Shortness Of Breath  . Penicillins Shortness Of Breath and Swelling  . Flagyl [Metronidazole] Other (See Comments)    Reports itching and bumps in mouth   . Tape Rash    Tape causes irritation on patient arms    Prescriptions prior to admission  Medication Sig Dispense Refill Last Dose  . Prenatal Vit-Fe Fumarate-FA (PRENATAL MULTIVITAMIN) TABS tablet Take 1 tablet by mouth daily at 12 noon.   01/21/2014 at Unknown time  . promethazine (PHENERGAN) 25 MG tablet Take 1 tablet (25 mg total) by mouth once as needed for nausea or vomiting. 30 tablet 0 Past Week at Unknown time  . clindamycin (CLEOCIN) 300 MG capsule Take 1 capsule (300 mg total) by mouth 3 (three) times daily. (Patient not taking: Reported on 01/22/2014) 21 capsule 0   . magnesium hydroxide (MILK OF MAGNESIA) 800 MG/5ML suspension Take 15 mLs by mouth daily as needed for constipation.   Past Month at Unknown time  .  oxycodone-acetaminophen (PERCOCET) 2.5-325 MG per tablet Take 1 tablet by mouth every 4 (four) hours as needed for pain.   More than a month at Unknown time  . phenazopyridine (PYRIDIUM) 200 MG tablet Take 1 tablet (200 mg total) by mouth 3 (three) times daily as needed for pain. (Patient not taking: Reported on 01/22/2014) 12 tablet 0     Review of Systems  Constitutional: Negative for fever and malaise/fatigue.  Gastrointestinal: Negative for nausea, vomiting, abdominal pain, diarrhea and constipation.  Genitourinary: Negative for dysuria, urgency and frequency.       Neg - vaginal bleeding, discharge, LOF    Physical Exam   Blood pressure 125/67, pulse 93, temperature 97.9 F (36.6 C), temperature source Oral, resp. rate 18, last menstrual period 07/10/2013.  Physical Exam  Constitutional: She is oriented to person, place, and time. She appears well-developed and well-nourished. No distress.  HENT:  Head: Normocephalic.  Cardiovascular: Normal rate.   Respiratory: Effort normal.  GI: Soft. She exhibits no distension and no mass. There is no tenderness. There is no rebound and no guarding.  Neurological: She is alert and oriented to person, place, and time.  Skin: Skin is warm and dry. No erythema.  Psychiatric: She has a normal mood and affect.   Results for orders placed or performed during the hospital encounter of 01/22/14 (from the past 24 hour(s))  Urinalysis, Routine w reflex microscopic     Status: Abnormal   Collection Time: 01/22/14  7:20 AM  Result Value Ref Range   Color, Urine YELLOW YELLOW   APPearance HAZY (A) CLEAR   Specific Gravity, Urine 1.015 1.005 - 1.030   pH 7.5 5.0 - 8.0   Glucose, UA NEGATIVE NEGATIVE mg/dL   Hgb urine dipstick TRACE (A) NEGATIVE   Bilirubin Urine NEGATIVE NEGATIVE   Ketones, ur NEGATIVE NEGATIVE mg/dL   Protein, ur NEGATIVE NEGATIVE mg/dL   Urobilinogen, UA 0.2 0.0 - 1.0 mg/dL   Nitrite NEGATIVE NEGATIVE   Leukocytes, UA NEGATIVE NEGATIVE  Urine microscopic-add on     Status: Abnormal   Collection Time: 01/22/14  7:20 AM  Result Value Ref Range   Squamous Epithelial / LPF MANY (A) RARE   WBC, UA 0-2 <3 WBC/hpf   RBC / HPF 0-2 <3 RBC/hpf   Bacteria, UA RARE RARE    Fetal Monitoring: Baseline: 140 bpm, moderate variability, 10 x 10 accelerations, no decelerations Contractions: none  MAU Course  Procedures None  MDM Discussed with Dr. Jackelyn KnifeMeisinger. Ok for discharge with reassurance.   Assessment and Plan  A: SIUP at 4133w2d Reactive NST  P: Discharge home Fetal kick counts discussed Patient advised to follow-up with Dr.  Jackelyn KnifeMeisinger as scheduled or sooner if symptoms worsen Patient may return to MAU as needed or if her condition were to change or worsen   Marny LowensteinJulie N Wenzel, PA-C  01/22/2014, 7:52 AM

## 2014-03-02 ENCOUNTER — Inpatient Hospital Stay (HOSPITAL_COMMUNITY)
Admission: AD | Admit: 2014-03-02 | Discharge: 2014-03-02 | Disposition: A | Discharge status: Home / Self Care | Payer: BLUE CROSS/BLUE SHIELD | Source: Ambulatory Visit | Attending: Obstetrics and Gynecology | Admitting: Obstetrics and Gynecology

## 2014-03-02 ENCOUNTER — Encounter (HOSPITAL_COMMUNITY): Payer: Self-pay

## 2014-03-02 DIAGNOSIS — R51 Headache: Secondary | ICD-10-CM | POA: Diagnosis not present

## 2014-03-02 DIAGNOSIS — Z3A31 31 weeks gestation of pregnancy: Secondary | ICD-10-CM | POA: Diagnosis not present

## 2014-03-02 DIAGNOSIS — R252 Cramp and spasm: Secondary | ICD-10-CM

## 2014-03-02 DIAGNOSIS — M79606 Pain in leg, unspecified: Secondary | ICD-10-CM | POA: Diagnosis not present

## 2014-03-02 DIAGNOSIS — O9989 Other specified diseases and conditions complicating pregnancy, childbirth and the puerperium: Secondary | ICD-10-CM | POA: Insufficient documentation

## 2014-03-02 DIAGNOSIS — H539 Unspecified visual disturbance: Secondary | ICD-10-CM | POA: Insufficient documentation

## 2014-03-02 DIAGNOSIS — H538 Other visual disturbances: Secondary | ICD-10-CM | POA: Diagnosis not present

## 2014-03-02 DIAGNOSIS — O99891 Other specified diseases and conditions complicating pregnancy: Secondary | ICD-10-CM

## 2014-03-02 DIAGNOSIS — O09293 Supervision of pregnancy with other poor reproductive or obstetric history, third trimester: Secondary | ICD-10-CM

## 2014-03-02 LAB — URINALYSIS, ROUTINE W REFLEX MICROSCOPIC
BILIRUBIN URINE: NEGATIVE
Glucose, UA: NEGATIVE mg/dL
Ketones, ur: NEGATIVE mg/dL
NITRITE: NEGATIVE
Protein, ur: NEGATIVE mg/dL
Specific Gravity, Urine: 1.01 (ref 1.005–1.030)
Urobilinogen, UA: 0.2 mg/dL (ref 0.0–1.0)
pH: 7 (ref 5.0–8.0)

## 2014-03-02 LAB — URINE MICROSCOPIC-ADD ON

## 2014-03-02 LAB — GLUCOSE, CAPILLARY: Glucose-Capillary: 71 mg/dL (ref 70–99)

## 2014-03-02 MED ORDER — PRENATAL PLUS 27-1 MG PO TABS
1.0000 | ORAL_TABLET | Freq: Every day | ORAL | Status: DC
Start: 2014-03-02 — End: 2014-03-24

## 2014-03-02 NOTE — MAU Note (Signed)
Pt states here for blurred vision and pain in both legs. Also has felt lower abdominal cramping. Denies bleeding. Has had issues with elevated blood sugars as well. Unsure if visual issues are r/t glucose levels.

## 2014-03-02 NOTE — MAU Provider Note (Signed)
History     CSN: [id number]  Arrival date and time: 03/02/14 1343   First Provider Initiated Contact with Patient 03/02/14 1412   Erika CourseJasmine Townsend 24 y.o.G3P0020 @[redacted]w[redacted]d     Chief Complaint  Patient presents with  . Blurred Vision  . Leg Pain  HPI Presents with a 4 day history of visual disturbance. She describes intermittently seeing spots and having visual blurring in both eyes. No eye pain. Denies scotomata at present. Mild headache yesterday. No headache now. She is also concerned with leg cramps mostly in her calves which occur randomly. In addition she reports abdominal tightening which occurs 2-3 times per day and has been present for weeks.  Good fetal movement; no vaginal bleeding; no leaking fluid.  Prenatal Townsend: Primary Dr. Ellyn HackBovard. Obesity; history of second trimester SAB 2, prophylactic abdominal cerclage in place; glucose intolerance by pt hx Leg Pain       Past Medical History  Diagnosis Date  . Endometritis following delivery 12/03/2012  . Asherman syndrome   . Incompetence of cervix   . PCOS (polycystic ovarian syndrome)     Past Surgical History  Procedure Laterality Date  . Dilation and curettage of uterus  01/26/2012    following a miscarriage  . Cervical cerclage N/A 11/10/2012    Procedure: CERCLAGE CERVICAL;  Surgeon: Oliver PilaKathy W Richardson, MD;  Location: [facility];  Service: Gynecology;  Laterality: N/A;  . Dilation and evacuation N/A 12/01/2012    Procedure: DILATATION AND EVACUATION;  Surgeon: Sherron MondayJody [name], MD;  Location: [facility];  Service: Gynecology;  Laterality: N/A;  . Cervical cerclage N/A 12/01/2012    Procedure: CERCLAGE CERVICAL REMOVAL;  Surgeon: Sherron MondayJody [name], MD;  Location: [facility];  Service: Gynecology;  Laterality: N/A;  . Dilation and evacuation N/A 12/03/2012    Procedure: DILATATION AND EVACUATION;  Surgeon: Sherron MondayJody [name], MD;  Location: [facility];  Service: Gynecology;  Laterality: N/A;  . Hysteroscopy w/d&c N/A 02/06/2013    Procedure:  SUCTION  D&C HYSTEROSCOPY/LYSIS OF ADHESIONS/ INTRAOP HSG COOK UTERINE STENT PLACEMENT;  Surgeon: Fermin Schwabamer Yalcinkaya, MD;  Location: [facility];  Service: Gynecology;  Laterality: N/A;    Family History  Problem Relation Age of Onset  . Stroke Maternal Grandmother   . Heart disease Maternal Grandmother   . Cancer Maternal Grandfather     History  Substance Use Topics  . Smoking status: Never Smoker   . Smokeless tobacco: Never Used  . Alcohol Use: No    Allergies:  Allergies  Allergen Reactions  . Cinnamon Shortness Of Breath  . Penicillins Shortness Of Breath and Swelling  . Flagyl [Metronidazole] Other (See Comments)    Reports itching and bumps in mouth   . Tape Rash    Tape causes irritation on patient arms    Prescriptions prior to admission  Medication Sig Dispense Refill Last Dose  . magnesium hydroxide (MILK OF MAGNESIA) 800 MG/5ML suspension Take 15 mLs by mouth daily as needed for constipation.   Past Month at Unknown time  . oxycodone-acetaminophen (PERCOCET) 2.5-325 MG per tablet Take 1 tablet by mouth every 4 (four) hours as needed for pain.   More than a month at Unknown time  . Prenatal Vit-Fe Fumarate-FA (PRENATAL MULTIVITAMIN) TABS tablet Take 1 tablet by mouth daily at 12 noon.   01/21/2014 at Unknown time  . promethazine (PHENERGAN) 25 MG tablet Take 1 tablet (25 mg total) by mouth once as needed for nausea or vomiting. 30 tablet 0 Past Week at Unknown time  Review of Systems  Constitutional: Negative for fever and chills.  HENT: Negative for tinnitus.   Cardiovascular: Negative for orthopnea and leg swelling.  Gastrointestinal: Positive for abdominal pain. Negative for nausea and vomiting.  Genitourinary: Negative for dysuria, urgency, frequency and hematuria.  Musculoskeletal: Negative for back pain.  Neurological: Positive for headaches.   Physical Exam   Blood pressure 124/53, pulse 109, temperature 98 F (36.7 C), resp. rate 18, height 5'  4" (1.626 m), weight 117.595 kg (259 lb 4 oz), last menstrual period 07/10/2013.  Physical Exam  Constitutional: She is oriented to person, place, and time. No distress.  Obese   HENT:  Head: Normocephalic.  Eyes: Conjunctivae and EOM are normal. Pupils are equal, round, and reactive to light.  Neck: Normal range of motion.  Cardiovascular: Normal rate and regular rhythm.   Respiratory: Effort normal and breath sounds normal.  GI: Soft. There is no tenderness.  Musculoskeletal: She exhibits no edema.  Tr pedal   Neurological: She is alert and oriented to person, place, and time. She has normal reflexes. She displays normal reflexes.  Fundus at CM +53fb  EFM: Baseline 140, moderate variability, reactive; toco: no UCs Dilation: Closed Effacement (%): Thick Cervical Position: Posterior Station: Ballotable Exam by:: D. Poppi Scantling CNM  [name]  Procedures Results for orders placed or performed during the hospital encounter of 03/02/14 (from the past 24 hour(s))  Urinalysis, Routine w reflex microscopic     Status: Abnormal   Collection Time: 03/02/14  1:53 PM  Result Value Ref Range   Color, Urine YELLOW YELLOW   APPearance CLEAR CLEAR   Specific Gravity, Urine 1.010 1.005 - 1.030   pH 7.0 5.0 - 8.0   Glucose, UA NEGATIVE NEGATIVE mg/dL   Hgb urine dipstick TRACE (A) NEGATIVE   Bilirubin Urine NEGATIVE NEGATIVE   Ketones, ur NEGATIVE NEGATIVE mg/dL   Protein, ur NEGATIVE NEGATIVE mg/dL   Urobilinogen, UA 0.2 0.0 - 1.0 mg/dL   Nitrite NEGATIVE NEGATIVE   Leukocytes, UA SMALL (A) NEGATIVE  Urine microscopic-add on     Status: Abnormal   Collection Time: 03/02/14  1:53 PM  Result Value Ref Range   Squamous Epithelial / LPF FEW (A) RARE   WBC, UA 0-2 <3 WBC/hpf   RBC / HPF 3-6 <3 RBC/hpf   Urine-Other MUCOUS PRESENT       Assessment and Plan  G3P0020 at 31wk6d 1. Blurred vision, bilateral   2. Leg cramps in pregnancy   3. Prior poor obstetrical history in third trimester,  antepartum     PLAN: Discussed with Dr. Meisinger> see optometrist if symptoms persist Discharge home with reassurance. General relief measures for leg cramps. PTL and FM precautions.    Medication List    STOP taking these medications        promethazine 25 MG tablet  Commonly known as:  PHENERGAN      TAKE these medications        prenatal vitamin w/FE, FA 27-1 MG Tabs tablet  Take 1 tablet by mouth daily.       Follow-up Information    Follow up with Sherian Rein, MD.   Specialty:  Obstetrics and Gynecology   Why:  Keep your scheduled prenatal appointment   Contact information:   [address] [place] [address] [phone]      Erika Townsend 03/02/2014, 2:32 PM

## 2014-03-02 NOTE — Progress Notes (Signed)
FHT from today reviewed.  Reactive NST, no decels, no regular ctx.

## 2014-03-02 NOTE — Discharge Instructions (Signed)
Blurred Vision You have been seen today complaining of blurred vision. This means you have a loss of ability to see small details.  CAUSES  Blurred vision can be a symptom of underlying eye problems, such as:  Aging of the eye (presbyopia).  Glaucoma.  Cataracts.  Eye infection.  Eye-related migraine.  Diabetes mellitus.  Fatigue.  Migraine headaches.  High blood pressure.  Breakdown of the back of the eye (macular degeneration).  Problems caused by some medications. The most common cause of blurred vision is the need for eyeglasses or a new prescription. Today in the emergency department, no cause for your blurred vision can be found. SYMPTOMS  Blurred vision is the loss of visual sharpness and detail (acuity). DIAGNOSIS  Should blurred vision continue, you should see your caregiver. If your caregiver is your primary care physician, he or she may choose to refer you to another specialist.  TREATMENT  Do not ignore your blurred vision. Make sure to have it checked out to see if further treatment or referral is necessary. SEEK MEDICAL CARE IF:  You are unable to get into a specialist so we can help you with a referral. SEEK IMMEDIATE MEDICAL CARE IF: You have severe eye pain, severe headache, or sudden loss of vision. MAKE SURE YOU:   Understand these instructions.  Will watch your condition.  Will get help right away if you are not doing well or get worse. Document Released: 12/30/2002 Document Revised: 03/21/2011 Document Reviewed: 08/01/2007 [facility] Patient Information 2015 [place], [place]. This information is not intended to replace advice given to you by your health care provider. Make sure you discuss any questions you have with your health care provider. Leg Cramps Leg cramps that occur during exercise can be caused by poor circulation or dehydration. However, muscle cramps that occur at rest or during the night are usually not due to any serious medical problem.  Heat cramps may cause muscle spasms during hot weather.  CAUSES There is no clear cause for muscle cramps. However, dehydration may be a factor for those who do not drink enough fluids and those who exercise in the heat. Imbalances in the level of sodium, potassium, calcium or magnesium in the muscle tissue may also be a factor. Some medications, such as water pills (diuretics), may cause loss of chemicals that the body needs (like sodium and potassium) and cause muscle cramps. TREATMENT   Make sure your diet has enough fluids and essential minerals for the muscle to work normally.  Avoid strenuous exercise for several days if you have been having frequent leg cramps.  Stretch and massage the cramped muscle for several minutes.  Some medicines may be helpful in some patients with night cramps. Only take over-the-counter or prescription medicines as directed by your caregiver. SEEK IMMEDIATE MEDICAL CARE IF:   Your leg cramps become worse.  Your foot becomes cold, numb, or blue. Document Released: 02/04/2004 Document Revised: 03/21/2011 Document Reviewed: 01/22/2008 [facility] Patient Information 2015 [place], [place]. This information is not intended to replace advice given to you by your health care provider. Make sure you discuss any questions you have with your health care provider.

## 2014-03-12 ENCOUNTER — Encounter: Payer: Managed Care, Other (non HMO) | Attending: Obstetrics and Gynecology

## 2014-03-23 ENCOUNTER — Observation Stay (HOSPITAL_COMMUNITY)
Admission: AD | Admit: 2014-03-23 | Discharge: 2014-03-24 | Disposition: A | Discharge status: Home / Self Care | Payer: Managed Care, Other (non HMO) | Source: Ambulatory Visit | Attending: Obstetrics and Gynecology | Admitting: Obstetrics and Gynecology

## 2014-03-23 ENCOUNTER — Inpatient Hospital Stay (HOSPITAL_COMMUNITY): Payer: Managed Care, Other (non HMO)

## 2014-03-23 ENCOUNTER — Encounter (HOSPITAL_COMMUNITY): Payer: Self-pay | Admitting: *Deleted

## 2014-03-23 DIAGNOSIS — E119 Type 2 diabetes mellitus without complications: Secondary | ICD-10-CM | POA: Diagnosis not present

## 2014-03-23 DIAGNOSIS — S99911A Unspecified injury of right ankle, initial encounter: Secondary | ICD-10-CM | POA: Diagnosis not present

## 2014-03-23 DIAGNOSIS — Z3A34 34 weeks gestation of pregnancy: Secondary | ICD-10-CM | POA: Diagnosis not present

## 2014-03-23 DIAGNOSIS — O99810 Abnormal glucose complicating pregnancy: Secondary | ICD-10-CM

## 2014-03-23 DIAGNOSIS — O4700 False labor before 37 completed weeks of gestation, unspecified trimester: Secondary | ICD-10-CM | POA: Diagnosis not present

## 2014-03-23 DIAGNOSIS — W1839XA Other fall on same level, initial encounter: Secondary | ICD-10-CM | POA: Diagnosis not present

## 2014-03-23 DIAGNOSIS — S3991XA Unspecified injury of abdomen, initial encounter: Secondary | ICD-10-CM | POA: Diagnosis not present

## 2014-03-23 DIAGNOSIS — Y998 Other external cause status: Secondary | ICD-10-CM | POA: Insufficient documentation

## 2014-03-23 DIAGNOSIS — Y9289 Other specified places as the place of occurrence of the external cause: Secondary | ICD-10-CM | POA: Insufficient documentation

## 2014-03-23 DIAGNOSIS — O9A213 Injury, poisoning and certain other consequences of external causes complicating pregnancy, third trimester: Secondary | ICD-10-CM | POA: Diagnosis not present

## 2014-03-23 DIAGNOSIS — Y9389 Activity, other specified: Secondary | ICD-10-CM | POA: Insufficient documentation

## 2014-03-23 DIAGNOSIS — O24113 Pre-existing diabetes mellitus, type 2, in pregnancy, third trimester: Secondary | ICD-10-CM | POA: Diagnosis not present

## 2014-03-23 DIAGNOSIS — O9A219 Injury, poisoning and certain other consequences of external causes complicating pregnancy, unspecified trimester: Secondary | ICD-10-CM

## 2014-03-23 HISTORY — DX: Abnormal glucose complicating pregnancy: O99.810

## 2014-03-23 HISTORY — DX: Type 2 diabetes mellitus without complications: E11.9

## 2014-03-23 LAB — GLUCOSE, CAPILLARY
GLUCOSE-CAPILLARY: 90 mg/dL (ref 70–99)
GLUCOSE-CAPILLARY: 92 mg/dL (ref 70–99)
Glucose-Capillary: 95 mg/dL (ref 70–99)
Glucose-Capillary: 100 mg/dL — ABNORMAL HIGH (ref 70–99)

## 2014-03-23 LAB — CBC
HCT: 30.4 % — ABNORMAL LOW (ref 36.0–46.0)
Hemoglobin: 9.8 g/dL — ABNORMAL LOW (ref 12.0–15.0)
MCH: 27.1 pg (ref 26.0–34.0)
MCHC: 32.2 g/dL (ref 30.0–36.0)
MCV: 84.2 fL (ref 78.0–100.0)
PLATELETS: 179 10*3/uL (ref 150–400)
RBC: 3.61 MIL/uL — ABNORMAL LOW (ref 3.87–5.11)
RDW: 14.5 % (ref 11.5–15.5)
WBC: 9.3 10*3/uL (ref 4.0–10.5)

## 2014-03-23 LAB — TYPE AND SCREEN
ABO/RH(D): O POS
Antibody Screen: NEGATIVE

## 2014-03-23 MED ORDER — ZOLPIDEM TARTRATE 5 MG PO TABS: 5.0000 mg | ORAL_TABLET | Freq: Every evening | ORAL | Status: DC | PRN

## 2014-03-23 MED ORDER — ACETAMINOPHEN 325 MG PO TABS
650.0000 mg | ORAL_TABLET | ORAL | Status: DC | PRN
  Administered 2014-03-23: 650 mg via ORAL
  Filled 2014-03-23: qty 2

## 2014-03-23 MED ORDER — PRENATAL MULTIVITAMIN CH
1.0000 | ORAL_TABLET | Freq: Every day | ORAL | Status: DC
  Administered 2014-03-23: 1 via ORAL
  Filled 2014-03-23: qty 1

## 2014-03-23 MED ORDER — DOCUSATE SODIUM 100 MG PO CAPS
100.0000 mg | ORAL_CAPSULE | Freq: Every day | ORAL | Status: DC
  Filled 2014-03-23 (×2): qty 1

## 2014-03-23 MED ORDER — CALCIUM CARBONATE ANTACID 500 MG PO CHEW: 2.0000 | CHEWABLE_TABLET | ORAL | Status: DC | PRN

## 2014-03-23 NOTE — Progress Notes (Signed)
SCD's placed on patient.  Spoke with durable medical supplies and he will be over to place ankle splint on patient.   She was able to ambulate well to the bathroom.

## 2014-03-23 NOTE — Progress Notes (Signed)
Was paged about consult for diabetes coordinator. Patient was admitted for a fall. Is [redacted] weeks gestation. Spoke with Dr. Ellyn HackBovard about the request for education on gestational diabetes. She wanted to give patient a glucose meter. Told her that we do not have them. The patient would need a prescription for one when discharged. Will have dietician to see patient to gjve her meal planning information.  Spoke with the patient on the phone. Stressed the importance of keeping blood sugars as close to normal as possible (fasting less than 95 mg/dl and 1 hour postprandials less than 140 mg/dl), explained to her the physiology of gestational diabetes and how high blood sugars would effect the baby. Stressed to her that she needs to take care of the baby! She seemed to understand the importance of checking her blood sugars. Staff RN to give patient book on gestational diabetes, have patient watch videos, and have dietician to see her. Will continue to watch while in the hospital. Smith MinceKendra Fisher Hargadon RN BSN CDE

## 2014-03-23 NOTE — H&P (Signed)
Erika Townsend is a 24 y.o. female G3P0020 at 34+ with fall - hurt ankle and hit belly.  IN MAU some uterine irritability, occasional contractions.  Admitted for 24 hr obs. D/W pt risk of abruption and sx's. Also in office, failed glucola.  Unable to tolerate 3 hr GTT.  Missed redo appointment, desires to check sugars, missed appointments to explain this.  Now 34 weeks, needs education regarding healthy diet and FSBS fasting and 1 hr PP.  +FM, no LOF, no VB, occ ctx.  Pregnancy complicated by h/o 17 and 19 wk loss, failed McDonald cerclage.  Has Abdominal cerclage placed 10/15 by Dr Clance Boll.  Pregnancy with good growth, anterior/L placenta, female, good CL - plan for delivery at 39wk via csection.  Ankle not broken on Xray, will obtain splint.  Maternal Medical History:  Contractions: Frequency: irregular.    Fetal activity: Perceived fetal activity is normal.     Admitted after fall, hit abdomen and ankle.  Uterine irriaitabilty, poor OB history  OB History    Gravida Para Term Preterm AB TAB SAB Ectopic Multiple Living   3 0   2  2   0    G1 17wk delivery, D&C G2 19wk PPROM, delivery, D&C x 2, failed cerclage asherman's - LOA with Dr. Jeannie Fend G3 present, abdominal cerclage 10/15 H/o abn pap, last in preg WNL No STDs  Past Medical History  Diagnosis Date  . Endometritis following delivery 12/03/2012  . Asherman syndrome   . Incompetence of cervix   . PCOS (polycystic ovarian syndrome)   . Diabetes mellitus without complication     pt states has problems with BS, but not diabetic  . Traumatic injury during pregnancy in third trimester 03/23/2014  . Abnormal glucose tolerance in pregnancy 03/23/2014   Past Surgical History  Procedure Laterality Date  . Dilation and curettage of uterus  01/26/2012    following a miscarriage  . Cervical cerclage N/A 11/10/2012    Procedure: CERCLAGE CERVICAL;  Surgeon: Oliver Pila, MD;  Location: [facility];  Service: Gynecology;  Laterality: N/A;  .  Dilation and evacuation N/A 12/01/2012    Procedure: DILATATION AND EVACUATION;  Surgeon: Sherron Monday, MD;  Location: [facility];  Service: Gynecology;  Laterality: N/A;  . Cervical cerclage N/A 12/01/2012    Procedure: CERCLAGE CERVICAL REMOVAL;  Surgeon: Sherron Monday, MD;  Location: [facility];  Service: Gynecology;  Laterality: N/A;  . Dilation and evacuation N/A 12/03/2012    Procedure: DILATATION AND EVACUATION;  Surgeon: Sherron Monday, MD;  Location: [facility];  Service: Gynecology;  Laterality: N/A;  . Hysteroscopy w/d&c N/A 02/06/2013    Procedure:  SUCTION D&C HYSTEROSCOPY/LYSIS OF ADHESIONS/ INTRAOP HSG COOK UTERINE STENT PLACEMENT;  Surgeon: Fermin Schwab, MD;  Location: [facility];  Service: Gynecology;  Laterality: N/A;  Abdominal cerclage placement  Family History: family history includes Cancer in her maternal grandfather; Heart disease in her maternal grandmother; Stroke in her maternal grandmother. Social History:  reports that she has never smoked. She has never used smokeless tobacco. She reports that she does not drink alcohol or use illicit drugs. single   Prenatal Transfer Tool  Maternal Diabetes: Yes:  Diabetes Type:  Diet controlled Genetic Screening: Normal Maternal Ultrasounds/Referrals: Normal Fetal Ultrasounds or other Referrals:  None Maternal Substance Abuse:  No Significant Maternal Medications:  None Significant Maternal Lab Results:  None Other Comments:  failed glucola, won't do 3hr, to check FSBS  Review of Systems  Constitutional: Negative.  Eyes: Negative.   Respiratory: Negative.   Cardiovascular: Negative.   Gastrointestinal: Negative.   Genitourinary: Negative.   Musculoskeletal: Positive for joint pain and falls.       Had fall, hit abdomen and ankle  Skin: Negative.   Neurological: Negative.   Psychiatric/Behavioral: Negative.       Blood pressure 120/64, pulse 96, temperature 97.9 F (36.6 C), temperature source Oral, resp. rate  18, height 5\' 4"  (1.626 m), weight 117.028 kg (258 lb), last menstrual period 07/10/2013, SpO2 96 %. Maternal Exam:  Uterine Assessment: Contraction strength is moderate.  Abdomen: Patient reports no abdominal tenderness. Fundal height is appropriate for gestation.   Estimated fetal weight is 5#.    Introitus: Normal vulva. Normal vagina.    Physical Exam  Constitutional: She is oriented to person, place, and time. She appears well-developed and well-nourished.  HENT:  Head: Normocephalic and atraumatic.  Cardiovascular: Normal rate and regular rhythm.   Respiratory: Breath sounds normal. No respiratory distress. She has no wheezes.  GI: Soft. Bowel sounds are normal. She exhibits no distension. There is no tenderness.  No bruising  Musculoskeletal: Normal range of motion.  Ankle swelling after fall  Neurological: She is alert and oriented to person, place, and time.  Skin: Skin is warm and dry.  Psychiatric: She has a normal mood and affect. Her behavior is normal.    Prenatal labs: ABO, Rh: --/--/O POS (03/13 1225) Antibody: NEG (03/13 1225) Rubella:  immune RPR:   NR HBsAg:  neg  HIV:   NR GBS:   unknown  Hgb 12.6/Plt 274K/Ur Cx neg/GC neg/Chl neg/First Tri and AFP WNL/  Nl anat, post plac, female 3/1 US good growth, good CL, nl AFI, vtx, nl anat  Tdap1/16, Flu 9/15  EDC 04/28/14  Assessment:  24yo G3P0 at 34++ with abd trauma And contractions, admit for obs Also FSBS teaching  Erika Townsend, Erika Townsend 03/23/2014, 2:04 PM

## 2014-03-23 NOTE — MAU Provider Note (Signed)
History     CSN: [id number]  Arrival date and time: 03/23/14 0350   None     Chief Complaint  Patient presents with  . Fall   HPI 24 y.o. G3P0020 at [redacted]w[redacted]d here reporting decreased fetal movement after fall at around 1:40 this morning. Pt states she got up quickly, felt lightheaded and fell onto her ankle on concrete. Reports slight impact to abdomen on L side. Did not lose consciousness. No bleeding, contractions, LOF. R ankle is swollen and painful, able to bear weight, but it is very painful. Pregnancy is complicated by history of cervical incompetence, abdominal cerclage in place, and GDM, pt states she has not checked her blood sugar because she does not have a monitor at home and she is waiting on rx to be filled. Last ate around 8 PM.   Past Medical History  Diagnosis Date  . Endometritis following delivery 12/03/2012  . Asherman syndrome   . Incompetence of cervix   . PCOS (polycystic ovarian syndrome)   . Diabetes mellitus without complication     pt states has problems with BS, but not diabetic    Past Surgical History  Procedure Laterality Date  . Dilation and curettage of uterus  01/26/2012    following a miscarriage  . Cervical cerclage N/A 11/10/2012    Procedure: CERCLAGE CERVICAL;  Surgeon: Oliver Pila, MD;  Location: [facility];  Service: Gynecology;  Laterality: N/A;  . Dilation and evacuation N/A 12/01/2012    Procedure: DILATATION AND EVACUATION;  Surgeon: Sherron Monday, MD;  Location: [facility];  Service: Gynecology;  Laterality: N/A;  . Cervical cerclage N/A 12/01/2012    Procedure: CERCLAGE CERVICAL REMOVAL;  Surgeon: Sherron Monday, MD;  Location: [facility];  Service: Gynecology;  Laterality: N/A;  . Dilation and evacuation N/A 12/03/2012    Procedure: DILATATION AND EVACUATION;  Surgeon: Sherron Monday, MD;  Location: [facility];  Service: Gynecology;  Laterality: N/A;  . Hysteroscopy w/d&c N/A 02/06/2013    Procedure:  SUCTION D&C HYSTEROSCOPY/LYSIS OF ADHESIONS/  INTRAOP HSG COOK UTERINE STENT PLACEMENT;  Surgeon: Fermin Schwab, MD;  Location: [facility];  Service: Gynecology;  Laterality: N/A;    Family History  Problem Relation Age of Onset  . Stroke Maternal Grandmother   . Heart disease Maternal Grandmother   . Cancer Maternal Grandfather     History  Substance Use Topics  . Smoking status: Never Smoker   . Smokeless tobacco: Never Used  . Alcohol Use: No    Allergies:  Allergies  Allergen Reactions  . Cinnamon Shortness Of Breath  . Penicillins Shortness Of Breath and Swelling  . Flagyl [Metronidazole] Itching and Other (See Comments)    Reaction:  Bumps in the mouth   . Tape Rash    Prescriptions prior to admission  Medication Sig Dispense Refill Last Dose  . prenatal vitamin w/FE, FA (PRENATAL 1 + 1) 27-1 MG TABS tablet Take 1 tablet by mouth daily. 30 each 0 03/22/2014 at Unknown time    Review of Systems  Constitutional: Negative.   Respiratory: Negative.   Cardiovascular: Negative.   Gastrointestinal: Negative for nausea, vomiting, abdominal pain, diarrhea and constipation.  Genitourinary: Negative for dysuria, urgency, frequency, hematuria and flank pain.       Negative for vaginal bleeding, cramping/contractions  Musculoskeletal: Negative.   Neurological: Negative.   Psychiatric/Behavioral: Negative.    Physical Exam   Blood pressure 117/64, pulse 99, temperature 97.9 F (36.6 C), temperature source Oral, resp. rate 20,  last menstrual period 07/10/2013, SpO2 96 %.  Physical Exam  Nursing note and vitals reviewed. Constitutional: She is oriented to person, place, and time. She appears well-developed and well-nourished. No distress.  Cardiovascular: Normal rate.   Respiratory: Effort normal.  GI: Soft. She exhibits no mass. There is tenderness (diffuse). There is no rebound and no guarding.  Musculoskeletal: Normal range of motion. She exhibits edema (bilateral pedal, R ankle > L ankle) and  tenderness (R ankle).  Neurological: She is alert and oriented to person, place, and time.  Skin: Skin is warm and dry.  Psychiatric: She has a normal mood and affect.   FHR: 130s bpm, variability: moderate,  accelerations:  Present,  decelerations:  Absent TOCO: occasional UI, no contractions  MAU Course  Procedures Results for orders placed or performed during the hospital encounter of 03/23/14 (from the past 24 hour(s))  Glucose, capillary     Status: None   Collection Time: 03/23/14  4:27 AM  Result Value Ref Range   Glucose-Capillary 90 70 - 99 mg/dL   Comment 1 Notify RN      Assessment and Plan  Xray of R ankle ordered FHR reassuring, TOCO w/ occasional UC, Dr. Ellyn HackBovard to review Care assumed by L. Leftwich-Kirby, CNM  FRAZIER,NATALIE 03/23/2014, 8:10 AM   Dg Ankle Complete Right  03/23/2014   CLINICAL DATA:  24 year old female thirty-five week pregnant patient reporting decreased fetal movement after a fall at 1:40 a.m. today, with injury to the right ankle.  EXAM: RIGHT ANKLE - COMPLETE 3+ VIEW  COMPARISON:  No priors.  FINDINGS: Mild soft tissue swelling around the ankle joint. No acute displaced fracture, subluxation or dislocation.  IMPRESSION: 1. No acute bony abnormality of the right ankle, however, there is diffuse mild soft tissue swelling around the ankle joint.   Electronically Signed   By: Trudie Reedaniel  Entrikin M.D.   On: 03/23/2014 09:03    A: 1. Soft tissue injury of right ankle   2. Right ankle injury, initial encounter   3. Traumatic injury during pregnancy, third trimester     P: Consult Dr Ellyn HackBovard Dr Ellyn HackBovard to room to see pt  LEFTWICH-KIRBY, Samyuktha Brau, CNM 9:43 AM

## 2014-03-23 NOTE — Progress Notes (Signed)
Patient was given handouts and pamphlet on Gestational diabetes.  We tried to play educational video but the system is not working.

## 2014-03-23 NOTE — Plan of Care (Signed)
Problem: Consults Goal: Birthing Suites Patient Information Press F2 to bring up selections list   Pt < [redacted] weeks EGA     

## 2014-03-23 NOTE — MAU Note (Signed)
Per Luster Landsbergenee, RN charge antenatal, pt to go to room 155.

## 2014-03-23 NOTE — Progress Notes (Signed)
Nutrition  Noted consult for CHO modified gestational diabetic diet. Will complete consult 3/14  Pt with regular diet ordered, [facility] to [facility]  [facility] MuskegonKatherine Kosisochukwu Goldberg M.Odis LusterEd. R.D. LDN Neonatal Nutrition Support Specialist/RD III Pager [phone]

## 2014-03-23 NOTE — MAU Note (Signed)
Patient states she was going outside around 0140 when she got up to quickly and felt light-headed and fell on her ankle onto the concrete.  She states she did not lose consciousness.  Patient states she has not felt the baby move since the fall so she came in.  Denies LOF or vaginal bleeding.

## 2014-03-24 DIAGNOSIS — O9A213 Injury, poisoning and certain other consequences of external causes complicating pregnancy, third trimester: Secondary | ICD-10-CM | POA: Diagnosis not present

## 2014-03-24 LAB — GLUCOSE, CAPILLARY
GLUCOSE-CAPILLARY: 94 mg/dL (ref 70–99)
Glucose-Capillary: 77 mg/dL (ref 70–99)

## 2014-03-24 MED ORDER — PRENATAL PLUS 27-1 MG PO TABS: 1.0000 | ORAL_TABLET | Freq: Every day | ORAL | Status: DC

## 2014-03-24 NOTE — Progress Notes (Signed)
Ur chart review completed.  

## 2014-03-24 NOTE — Progress Notes (Signed)
Patient ID: Erika Townsend, female   DOB: 1990-11-06, 24 y.o.   MRN: [id number]  +FM, no LOF, no VB, occ ctx; no c/o's  AFVSS gen NAD FHTs 130-140, good var, category 1 toco occasional  Working to figure out glucometer, sugars yesterday 90-100.  [id number] at 34+6 admitted after abd trauma, injury to ankle for obs Glucometer teaching D/C after 24 hrs monitoring

## 2014-03-24 NOTE — Progress Notes (Signed)
  Nutrition Dx: Food and nutrition-related knowledge deficit r/t no previous education aeb newly diagnosed GDM.   Nutrition education consult for Carbohydrate Modified Gestational Diabetic Diet completed.  "Meal  plan for gestational diabetics" handout given to patient.  Basic concepts reviewed. Pt plans to eliminate high CHO foods and moderate CHO portion sizes while monitoring glucose levels. If glucose levels rise above what was recommended, she will start to count her CHO grams at meals and snacks. Pt able to repeat back glucose goals and CHO limits for meals.  Questions answered.  Patient verbalizes understanding.  Elisabeth CaraKatherine Nyala Kirchner M.Odis LusterEd. R.D. LDN Neonatal Nutrition Support Specialist/RD III Pager [phone]

## 2014-03-24 NOTE — Discharge Summary (Signed)
Obstetric Discharge Summary Reason for Admission: abdomenal trauma, glucometer teaching Prenatal Procedures: NST Intrapartum Procedures: N/A Postpartum Procedures: N/A Complications-Operative and Postpartum: N/A HEMOGLOBIN  Date Value Ref Range Status  03/23/2014 9.8* 12.0 - 15.0 g/dL Final   HCT  Date Value Ref Range Status  03/23/2014 30.4* 36.0 - 46.0 % Final    Physical Exam:  General: alert and no distress Abd gravid, NT FHTs category 1  Discharge Diagnoses: abd trauma in preg  Discharge Information: Date: 03/24/2014 Activity: as tolerated Diet: routine - watch carbs, check sugars Medications: PNV Condition: stable Instructions: refer to practice specific booklet Discharge to: home Follow-up Information    Follow up with Sherian ReinBovard-Stuckert, Flornce Record, MD.   Specialty:  Obstetrics and Gynecology   Why:  As scheduled!   Contact information:   [address] [place] [id number] [phone]       [name]-Stuckert, Augusto GambleJody 03/24/2014, 8:21 AM

## 2014-03-29 ENCOUNTER — Inpatient Hospital Stay (HOSPITAL_COMMUNITY)
Admission: AD | Admit: 2014-03-29 | Discharge: 2014-03-29 | Disposition: A | Discharge status: Home / Self Care | Payer: BLUE CROSS/BLUE SHIELD | Source: Ambulatory Visit | Attending: Obstetrics and Gynecology | Admitting: Obstetrics and Gynecology

## 2014-03-29 ENCOUNTER — Encounter (HOSPITAL_COMMUNITY): Payer: Self-pay | Admitting: *Deleted

## 2014-03-29 DIAGNOSIS — N5089 Other specified disorders of the male genital organs: Secondary | ICD-10-CM

## 2014-03-29 DIAGNOSIS — Z3A35 35 weeks gestation of pregnancy: Secondary | ICD-10-CM

## 2014-03-29 DIAGNOSIS — O9989 Other specified diseases and conditions complicating pregnancy, childbirth and the puerperium: Secondary | ICD-10-CM

## 2014-03-29 LAB — URINALYSIS, ROUTINE W REFLEX MICROSCOPIC
BILIRUBIN URINE: NEGATIVE
GLUCOSE, UA: NEGATIVE mg/dL
Ketones, ur: NEGATIVE mg/dL
Nitrite: NEGATIVE
PH: 6.5 (ref 5.0–8.0)
Protein, ur: NEGATIVE mg/dL
Specific Gravity, Urine: 1.01 (ref 1.005–1.030)
Urobilinogen, UA: 0.2 mg/dL (ref 0.0–1.0)

## 2014-03-29 LAB — URINE MICROSCOPIC-ADD ON

## 2014-03-29 LAB — AMNISURE RUPTURE OF MEMBRANE (ROM) NOT AT ARMC: Amnisure ROM: NEGATIVE

## 2014-03-29 NOTE — MAU Provider Note (Signed)
History     CSN: [id number]  Arrival date and time: 03/29/14 [id number]   First Provider Initiated Contact with Patient 03/29/14 0422      Chief Complaint  Patient presents with  . Rupture of Membranes   HPI  24 y.o. [id number] presents to the MAU because she had a gush of fluid earlier tonight and thought her water broke. She also reported that she had sex earlier tonight.  Past Medical History  Diagnosis Date  . Endometritis following delivery 12/03/2012  . Asherman syndrome   . Incompetence of cervix   . PCOS (polycystic ovarian syndrome)   . Diabetes mellitus without complication     pt states has problems with BS, but not diabetic  . Traumatic injury during pregnancy in third trimester 03/23/2014  . Abnormal glucose tolerance in pregnancy 03/23/2014    Past Surgical History  Procedure Laterality Date  . Dilation and curettage of uterus  01/26/2012    following a miscarriage  . Cervical cerclage N/A 11/10/2012    Procedure: CERCLAGE CERVICAL;  Surgeon: Oliver PilaKathy W Richardson, MD;  Location: [facility];  Service: Gynecology;  Laterality: N/A;  . Dilation and evacuation N/A 12/01/2012    Procedure: DILATATION AND EVACUATION;  Surgeon: Sherron MondayJody [name], MD;  Location: [facility];  Service: Gynecology;  Laterality: N/A;  . Cervical cerclage N/A 12/01/2012    Procedure: CERCLAGE CERVICAL REMOVAL;  Surgeon: Sherron MondayJody [name], MD;  Location: [facility];  Service: Gynecology;  Laterality: N/A;  . Dilation and evacuation N/A 12/03/2012    Procedure: DILATATION AND EVACUATION;  Surgeon: Sherron MondayJody [name], MD;  Location: [facility];  Service: Gynecology;  Laterality: N/A;  . Hysteroscopy w/d&c N/A 02/06/2013    Procedure:  SUCTION D&C HYSTEROSCOPY/LYSIS OF ADHESIONS/ INTRAOP HSG COOK UTERINE STENT PLACEMENT;  Surgeon: Fermin Schwabamer Yalcinkaya, MD;  Location: [facility];  Service: Gynecology;  Laterality: N/A;    Family History  Problem Relation Age of Onset  . Stroke Maternal Grandmother   . Heart disease  Maternal Grandmother   . Cancer Maternal Grandfather     History  Substance Use Topics  . Smoking status: Never Smoker   . Smokeless tobacco: Never Used  . Alcohol Use: No    Allergies:  Allergies  Allergen Reactions  . Cinnamon Shortness Of Breath  . Penicillins Shortness Of Breath and Swelling  . Diphenhydramine Hives  . Flagyl [Metronidazole] Itching and Other (See Comments)    Reaction:  Bumps in the mouth   . Tape Rash    Prescriptions prior to admission  Medication Sig Dispense Refill Last Dose  . prenatal vitamin w/FE, FA (PRENATAL 1 + 1) 27-1 MG TABS tablet Take 1 tablet by mouth daily. 30 each 2     Review of Systems  Constitutional: Negative.   HENT: Negative.   Eyes: Negative.   Respiratory: Negative.   Cardiovascular: Negative.   Gastrointestinal: Negative.   Genitourinary:       Leaking Vaginal fluid x 1 episode  Musculoskeletal: Negative.   Skin: Negative.   Neurological: Negative.   Endo/Heme/Allergies: Negative.   Psychiatric/Behavioral: Negative.    Physical Exam   Blood pressure 112/60, pulse 92, temperature 98.5 F (36.9 C), temperature source Oral, resp. rate 20, last menstrual period 07/10/2013.  Physical Exam  Constitutional: She is oriented to person, place, and time. She appears well-developed and well-nourished. No distress.  HENT:  Head: Normocephalic and atraumatic.  Neck: Normal range of motion.  Cardiovascular: Normal rate.   Respiratory: Effort normal. No respiratory  distress.  GI: Soft.  Musculoskeletal: Normal range of motion.  Neurological: She is alert and oriented to person, place, and time.  Skin: Skin is warm and dry.  Psychiatric: She has a normal mood and affect. Her behavior is normal. Judgment and thought content normal.    MAU Course  Procedures   amnisure efm  Results for orders placed or performed during the hospital encounter of 03/29/14 (from the past 24 hour(s))  Urinalysis, Routine w reflex microscopic      Status: Abnormal   Collection Time: 03/29/14  3:15 AM  Result Value Ref Range   Color, Urine YELLOW YELLOW   APPearance CLEAR CLEAR   Specific Gravity, Urine 1.010 1.005 - 1.030   pH 6.5 5.0 - 8.0   Glucose, UA NEGATIVE NEGATIVE mg/dL   Hgb urine dipstick TRACE (A) NEGATIVE   Bilirubin Urine NEGATIVE NEGATIVE   Ketones, ur NEGATIVE NEGATIVE mg/dL   Protein, ur NEGATIVE NEGATIVE mg/dL   Urobilinogen, UA 0.2 0.0 - 1.0 mg/dL   Nitrite NEGATIVE NEGATIVE   Leukocytes, UA LARGE (A) NEGATIVE  Urine microscopic-add on     Status: Abnormal   Collection Time: 03/29/14  3:15 AM  Result Value Ref Range   Squamous Epithelial / LPF FEW (A) RARE   WBC, UA 7-10 <3 WBC/hpf   RBC / HPF 3-6 <3 RBC/hpf   Bacteria, UA FEW (A) RARE  Amnisure rupture of membrane (rom)     Status: None   Collection Time: 03/29/14  3:40 AM  Result Value Ref Range   Amnisure ROM NEGATIVE      Assessment and Plan  IUP @ 35+5 FHT's Cat 1 Possible SROM  Spoke with Dr Senaida Ores re POC Pt will be discharged to home. F/U at regular scheduled appt  [facility] 03/29/2014, 4:29 AM

## 2014-03-29 NOTE — Discharge Instructions (Signed)
Premature Rupture and Preterm Premature Rupture of Membranes °A sac made up of membranes surrounds your baby in the womb (uterus). When this sac breaks before contractions or labor starts, it is called premature rupture of membranes (PROM). Rupture of membranes is also known as your water breaking. If this happens before 37 weeks, it is called preterm premature rupture of membranes (PPROM). PPROM is serious. It needs medical care right away. °CAUSES  °PROM may be caused by the membranes getting weak. This happens at the end of pregnancy. PPROM is often due to an infection, but can be caused by a number of other things.  °SIGNS OF PROM OR PPROM °· A sudden gush of fluid from the vagina. °· A slow leak of fluid from the vagina. °· Your underwear stay wet. °WHAT TO DO IF YOU THINK YOUR WATER BROKE °Call your doctor right away. You will need to go to the hospital to get checked right away. °WHAT HAPPENS IF YOU ARE TOLD YOU HAVE PROM OR PPROM? °You will have tests done at the hospital. If you have PROM, you may be given medicine to start labor (induced). This may happen if you are not having contractions within 24 hours of your water breaking. If you have PPROM and are not having contractions, you may be given medicine to start labor. It will depend on how far along you are in your pregnancy. °If you have PPROM, you: °· And your baby will be watched closely for signs of infection or other problems. °· May be given an antibiotic medicine. This can stop an infection from starting. °· May be given a steroid medicine. This can help the lungs to develop faster. °· May be given a medicine to stop early labor (preterm labor). °· May be told to stay in bed except to use the restroom (bed rest). °· May be given medicine to start labor. This can happen if there are problems with you or the baby. °Your treatment will depend on many factors. °Document Released: 03/25/2008 Document Revised: 08/29/2012 Document Reviewed:  04/17/2012 °Exit[employer]® Patient Information ©2015 [employer]. This information is not intended to replace advice given to you by your health care provider. Make sure you discuss any questions you have with your health care provider. ° °

## 2014-03-29 NOTE — MAU Note (Signed)
Patient states she has been contracting on and off since she was [facility], but she states she presents to MAU this morning because she felt a gush of yellowish clear fluid drip down her leg and is concerned her water broke.  States baby is moving well.  Denies vaginal bleeding.

## 2014-04-18 ENCOUNTER — Encounter (HOSPITAL_COMMUNITY)
Admission: RE | Admit: 2014-04-18 | Discharge: 2014-04-18 | Disposition: A | Discharge status: Home / Self Care | Payer: BLUE CROSS/BLUE SHIELD | Source: Ambulatory Visit | Attending: Obstetrics and Gynecology | Admitting: Obstetrics and Gynecology

## 2014-04-18 ENCOUNTER — Encounter (HOSPITAL_COMMUNITY): Payer: Self-pay

## 2014-04-18 DIAGNOSIS — Z01818 Encounter for other preprocedural examination: Secondary | ICD-10-CM

## 2014-04-18 LAB — CBC
HEMATOCRIT: 30.5 % — AB (ref 36.0–46.0)
Hemoglobin: 10 g/dL — ABNORMAL LOW (ref 12.0–15.0)
MCH: 26.9 pg (ref 26.0–34.0)
MCHC: 32.8 g/dL (ref 30.0–36.0)
MCV: 82 fL (ref 78.0–100.0)
Platelets: 188 10*3/uL (ref 150–400)
RBC: 3.72 MIL/uL — ABNORMAL LOW (ref 3.87–5.11)
RDW: 14.7 % (ref 11.5–15.5)
WBC: 10.1 10*3/uL (ref 4.0–10.5)

## 2014-04-18 LAB — BASIC METABOLIC PANEL
Anion gap: 8 (ref 5–15)
BUN: <5 mg/dL — ABNORMAL LOW (ref 6–23)
CALCIUM: 8.9 mg/dL (ref 8.4–10.5)
CHLORIDE: 107 mmol/L (ref 96–112)
CO2: 23 mmol/L (ref 19–32)
Creatinine, Ser: 0.62 mg/dL (ref 0.50–1.10)
Glucose, Bld: 89 mg/dL (ref 70–99)
Potassium: 3.5 mmol/L (ref 3.5–5.1)
Sodium: 138 mmol/L (ref 135–145)

## 2014-04-18 LAB — TYPE AND SCREEN
ABO/RH(D): O POS
Antibody Screen: NEGATIVE

## 2014-04-18 NOTE — Patient Instructions (Signed)
   Your procedure is scheduled on: April 11 AT 730AM  Enter through the Main Entrance of [facility] at: 6AM  Pick up the phone at the desk and dial [phone] and inform us of your arrival.  Please call this number if you have any problems the morning of surgery: [phone]  Remember: Do not eat food or drink liquids  after midnight: Sunday NIGHT  :  Take these medicines the morning of surgery with a SIP OF WATER:  Do not wear jewelry, make-up, or FINGER nail polish No metal in your hair or on your body. Do not wear lotions, powders, perfumes.  You may wear deodorant.  Do not bring valuables to the hospital. Contacts, dentures or bridgework may not be worn into surgery.  Leave suitcase in the car. After Surgery it may be brought to your room. For patients being admitted to the hospital, checkout time is 11:00am the day of discharge.

## 2014-04-19 ENCOUNTER — Encounter (HOSPITAL_COMMUNITY): Payer: Self-pay | Admitting: Obstetrics and Gynecology

## 2014-04-19 DIAGNOSIS — O09293 Supervision of pregnancy with other poor reproductive or obstetric history, third trimester: Secondary | ICD-10-CM

## 2014-04-19 DIAGNOSIS — O3433 Maternal care for cervical incompetence, third trimester: Secondary | ICD-10-CM | POA: Diagnosis present

## 2014-04-19 LAB — RPR: RPR Ser Ql: NONREACTIVE

## 2014-04-19 NOTE — H&P (Signed)
Erika Townsend is a 24 y.o. female G3P0020 at 39+ with poor OB history, 17 week delivery,Rolene Course with D&C x 2 for retained placenta, and 19wk delivery after failed McDonald cerclage with D&C x 2 for retained placenta - with Erika's. Sent to [facility] Townsend had D&C and excision of scar tissue. Also had abdominal cerclage placed in early pregnancy.+FM, no LOF, no VB, rare ctx.  D/W pt r/b/a of LTCS secondary to abdominal cerclage.  Maternal Medical History:  Contractions: Frequency: irregular.    Fetal activity: Perceived fetal activity is normal.    Prenatal Complications - Diabetes: type 1. Diabetes is managed by diet.      OB History    Gravida Para Term Preterm AB TAB SAB Ectopic Multiple Living   3 0   2  2   0    G1 17wk loss, retained placenta, D&C  X 2 G2 19wk loss, after failed cerclage (McDonald), D&C x 2 - resulting Erika's - D&C, resection of scar tissue by Erika Townsend G3 present, early abdominal cerclage (10/17/13), failed glucola, couldn't tolerate 3hr GTT-  Checking sugars  No abn pap, no STDs  Past Medical History  Diagnosis Date  . Endometritis following delivery 12/03/2012  . Erika Townsend   . Incompetence of cervix   . PCOS (polycystic ovarian Townsend)   . Diabetes mellitus without complication     pt states has problems with BS, but not diabetic  . Traumatic injury during pregnancy in third trimester 03/23/2014  . Abnormal glucose tolerance in pregnancy 03/23/2014  . Cervical cerclage suture present in third trimester 04/19/2014   Past Surgical History  Procedure Laterality Date  . Dilation and curettage of uterus  01/26/2012    following a miscarriage  . Cervical cerclage N/A 11/10/2012    Procedure: CERCLAGE CERVICAL;  Surgeon: Oliver PilaKathy W Richardson, MD;  Location: [facility];  Service: Gynecology;  Laterality: N/A;  . Dilation and evacuation N/A 12/01/2012    Procedure: DILATATION AND EVACUATION;  Surgeon: Sherron MondayJody [name], MD;  Location: [facility];  Service:  Gynecology;  Laterality: N/A;  . Cervical cerclage N/A 12/01/2012    Procedure: CERCLAGE CERVICAL REMOVAL;  Surgeon: Sherron MondayJody [name], MD;  Location: [facility];  Service: Gynecology;  Laterality: N/A;  . Dilation and evacuation N/A 12/03/2012    Procedure: DILATATION AND EVACUATION;  Surgeon: Sherron MondayJody [name], MD;  Location: [facility];  Service: Gynecology;  Laterality: N/A;  . Hysteroscopy w/d&c N/A 02/06/2013    Procedure:  SUCTION D&C HYSTEROSCOPY/LYSIS OF ADHESIONS/ INTRAOP HSG COOK UTERINE STENT PLACEMENT;  Surgeon: Erika Schwabamer Yalcinkaya, MD;  Location: [facility];  Service: Gynecology;  Laterality: N/A;   Family History: family history includes Cancer in her maternal grandfather; Heart disease in her maternal grandmother; Stroke in her maternal grandmother. Social History:  reports that she has never smoked. She has never used smokeless tobacco. She reports that she does not drink alcohol or use illicit drugs. single, student Meds: pantpropazole, PNV All PCN, Cinnamon   Prenatal Transfer Tool  Maternal Diabetes: Yes:  Diabetes Type:  Diet controlled Genetic Screening: Normal Maternal Ultrasounds/Referrals: Normal Fetal Ultrasounds or other Referrals:  None Maternal Substance Abuse:  No Significant Maternal Medications:  None Significant Maternal Lab Results:  Lab values include: Group B Strep positive Other Comments:  None  Review of Systems  Constitutional: Negative.   HENT: Negative.   Eyes: Negative.   Respiratory: Negative.   Cardiovascular: Negative.   Gastrointestinal: Negative.   Genitourinary: Negative.   Musculoskeletal: Negative.  Skin: Negative.   Neurological: Negative.   Psychiatric/Behavioral: Negative.       Last menstrual period 07/10/2013. Maternal Exam:  Uterine Assessment: Contraction frequency is irregular.   Abdomen: Patient reports no abdominal tenderness. Surgical scars: laparoscopic scar.   Fundal height is appropriate for gestation.   Estimated  fetal weight is 7.5-8.5#.   Fetal presentation: vertex  Introitus: Normal vulva. Normal vagina.  Pelvis: adequate for delivery.      Physical Exam  Constitutional: She appears well-developed and well-nourished.  HENT:  Head: Normocephalic and atraumatic.  Cardiovascular: Normal rate and regular rhythm.   Respiratory: Effort normal and breath sounds normal. No respiratory distress. She has no wheezes.  GI: Soft. Bowel sounds are normal. She exhibits no distension. There is no tenderness.  Musculoskeletal: Normal range of motion.  Neurological: She is alert.  Skin: Skin is warm and dry.  Psychiatric: She has a normal mood and affect. Her behavior is normal.    Prenatal labs: ABO, Rh: --/--/O POS (04/08 0920) Antibody: NEG (04/08 0920) Rubella:   immune RPR: Non Reactive (04/08 0920)  HBsAg:   neg HIV:   neg GBS:   positive, clinda resis  Hgb 12.6/Plt 279/Ur Cx neg/GC neg/ Chl neg/Pap WNL/First Tri andAFP WNL/glucola 157 - 3hr GTT unable to tolerate - checking sugars  First tri screen - nl NT, previa [employer] resolved Nl anat, good growth, ant/L plac, nl AFI Nl CL  Tdap1/19/16 Flu 09/01/13  Assessment/Plan: 24yo [id number] at39 for primary LTCS given term and history of abdominal cervical cerclage Clinda/Gent for prophylaxis' gbbs positive D/w ptr/b/a for LTCS   Erika Townsend, Erika Townsend 04/19/2014, 9:03 PM

## 2014-04-20 MED ORDER — GENTAMICIN SULFATE 40 MG/ML IJ SOLN
INTRAVENOUS | Status: AC
  Administered 2014-04-21: 116 mL via INTRAVENOUS
  Filled 2014-04-20: qty 10

## 2014-04-21 ENCOUNTER — Encounter (HOSPITAL_COMMUNITY): Payer: Self-pay | Admitting: Certified Registered Nurse Anesthetist

## 2014-04-21 ENCOUNTER — Inpatient Hospital Stay (HOSPITAL_COMMUNITY): Payer: BLUE CROSS/BLUE SHIELD | Admitting: Anesthesiology

## 2014-04-21 ENCOUNTER — Encounter (HOSPITAL_COMMUNITY)
Admission: RE | Disposition: A | Discharge status: Home / Self Care | Payer: Self-pay | Source: Ambulatory Visit | Attending: Obstetrics and Gynecology

## 2014-04-21 ENCOUNTER — Inpatient Hospital Stay (HOSPITAL_COMMUNITY)
Admission: RE | Admit: 2014-04-21 | Discharge: 2014-04-24 | DRG: 766 | Disposition: A | Discharge status: Home / Self Care | Payer: BLUE CROSS/BLUE SHIELD | Source: Ambulatory Visit | Attending: Obstetrics and Gynecology | Admitting: Obstetrics and Gynecology

## 2014-04-21 DIAGNOSIS — Z98891 History of uterine scar from previous surgery: Secondary | ICD-10-CM

## 2014-04-21 DIAGNOSIS — O3433 Maternal care for cervical incompetence, third trimester: Secondary | ICD-10-CM | POA: Diagnosis present

## 2014-04-21 DIAGNOSIS — E282 Polycystic ovarian syndrome: Secondary | ICD-10-CM | POA: Diagnosis present

## 2014-04-21 DIAGNOSIS — O09293 Supervision of pregnancy with other poor reproductive or obstetric history, third trimester: Secondary | ICD-10-CM | POA: Diagnosis present

## 2014-04-21 DIAGNOSIS — Z3A39 39 weeks gestation of pregnancy: Secondary | ICD-10-CM | POA: Diagnosis present

## 2014-04-21 DIAGNOSIS — N856 Intrauterine synechiae: Secondary | ICD-10-CM | POA: Diagnosis present

## 2014-04-21 DIAGNOSIS — O24420 Gestational diabetes mellitus in childbirth, diet controlled: Secondary | ICD-10-CM | POA: Diagnosis present

## 2014-04-21 DIAGNOSIS — O99824 Streptococcus B carrier state complicating childbirth: Secondary | ICD-10-CM | POA: Diagnosis present

## 2014-04-21 DIAGNOSIS — O3483 Maternal care for other abnormalities of pelvic organs, third trimester: Secondary | ICD-10-CM | POA: Diagnosis present

## 2014-04-21 DIAGNOSIS — O9A213 Injury, poisoning and certain other consequences of external causes complicating pregnancy, third trimester: Secondary | ICD-10-CM

## 2014-04-21 DIAGNOSIS — O99810 Abnormal glucose complicating pregnancy: Secondary | ICD-10-CM

## 2014-04-21 HISTORY — DX: History of uterine scar from previous surgery: Z98.891

## 2014-04-21 LAB — GLUCOSE, CAPILLARY
GLUCOSE-CAPILLARY: 93 mg/dL (ref 70–99)
Glucose-Capillary: 79 mg/dL (ref 70–99)

## 2014-04-21 SURGERY — Surgical Case
Anesthesia: Spinal

## 2014-04-21 MED ORDER — OXYTOCIN 40 UNITS IN LACTATED RINGERS INFUSION - SIMPLE MED: 62.5000 mL/h | INTRAVENOUS | Status: AC

## 2014-04-21 MED ORDER — ACETAMINOPHEN 325 MG PO TABS: 650.0000 mg | ORAL_TABLET | ORAL | Status: DC | PRN

## 2014-04-21 MED ORDER — PRENATAL MULTIVITAMIN CH
1.0000 | ORAL_TABLET | Freq: Every day | ORAL | Status: DC
  Administered 2014-04-22: 1 via ORAL
  Filled 2014-04-21 (×2): qty 1

## 2014-04-21 MED ORDER — SIMETHICONE 80 MG PO CHEW: 80.0000 mg | CHEWABLE_TABLET | ORAL | Status: DC | PRN

## 2014-04-21 MED ORDER — WITCH HAZEL-GLYCERIN EX PADS: 1.0000 "application " | MEDICATED_PAD | CUTANEOUS | Status: DC | PRN

## 2014-04-21 MED ORDER — OXYCODONE-ACETAMINOPHEN 5-325 MG PO TABS: 2.0000 | ORAL_TABLET | ORAL | Status: DC | PRN

## 2014-04-21 MED ORDER — LACTATED RINGERS IV SOLN
INTRAVENOUS | Status: DC | PRN
  Administered 2014-04-21: 08:00:00 via INTRAVENOUS

## 2014-04-21 MED ORDER — PHENYLEPHRINE 8 MG IN D5W 100 ML (0.08MG/ML) PREMIX OPTIME
INJECTION | INTRAVENOUS | Status: DC | PRN
  Administered 2014-04-21: 60 ug/min via INTRAVENOUS

## 2014-04-21 MED ORDER — LACTATED RINGERS IV SOLN
Freq: Once | INTRAVENOUS | Status: AC
  Administered 2014-04-21: 06:00:00 via INTRAVENOUS

## 2014-04-21 MED ORDER — OXYTOCIN 10 UNIT/ML IJ SOLN
INTRAMUSCULAR | Status: AC
  Filled 2014-04-21: qty 4

## 2014-04-21 MED ORDER — SENNOSIDES-DOCUSATE SODIUM 8.6-50 MG PO TABS
2.0000 | ORAL_TABLET | ORAL | Status: DC
  Administered 2014-04-21 – 2014-04-23 (×3): 2 via ORAL
  Filled 2014-04-21 (×3): qty 2

## 2014-04-21 MED ORDER — SIMETHICONE 80 MG PO CHEW
80.0000 mg | CHEWABLE_TABLET | ORAL | Status: DC
  Administered 2014-04-21 – 2014-04-23 (×3): 80 mg via ORAL
  Filled 2014-04-21 (×3): qty 1

## 2014-04-21 MED ORDER — SIMETHICONE 80 MG PO CHEW
80.0000 mg | CHEWABLE_TABLET | Freq: Three times a day (TID) | ORAL | Status: DC
  Administered 2014-04-22 – 2014-04-24 (×3): 80 mg via ORAL
  Filled 2014-04-21 (×4): qty 1

## 2014-04-21 MED ORDER — ZOLPIDEM TARTRATE 5 MG PO TABS: 5.0000 mg | ORAL_TABLET | Freq: Every evening | ORAL | Status: DC | PRN

## 2014-04-21 MED ORDER — IBUPROFEN 800 MG PO TABS
800.0000 mg | ORAL_TABLET | Freq: Three times a day (TID) | ORAL | Status: DC
  Administered 2014-04-21 – 2014-04-24 (×9): 800 mg via ORAL
  Filled 2014-04-21 (×9): qty 1

## 2014-04-21 MED ORDER — LANOLIN HYDROUS EX OINT: 1.0000 "application " | TOPICAL_OINTMENT | CUTANEOUS | Status: DC | PRN

## 2014-04-21 MED ORDER — ACETAMINOPHEN 500 MG PO TABS
1000.0000 mg | ORAL_TABLET | Freq: Once | ORAL | Status: AC
  Administered 2014-04-21: 1000 mg via ORAL

## 2014-04-21 MED ORDER — ACETAMINOPHEN 500 MG PO TABS
ORAL_TABLET | ORAL | Status: AC
Start: 2014-04-21 — End: 2014-04-21
  Filled 2014-04-21: qty 2

## 2014-04-21 MED ORDER — HYDROMORPHONE HCL 1 MG/ML IJ SOLN
INTRAMUSCULAR | Status: AC
  Filled 2014-04-21: qty 1

## 2014-04-21 MED ORDER — PHENYLEPHRINE 8 MG IN D5W 100 ML (0.08MG/ML) PREMIX OPTIME
INJECTION | INTRAVENOUS | Status: AC
  Filled 2014-04-21: qty 100

## 2014-04-21 MED ORDER — LACTATED RINGERS IV SOLN
INTRAVENOUS | Status: DC
  Administered 2014-04-21 (×3): via INTRAVENOUS

## 2014-04-21 MED ORDER — OXYTOCIN 10 UNIT/ML IJ SOLN
40.0000 [IU] | INTRAVENOUS | Status: DC | PRN
  Administered 2014-04-21: 40 [IU] via INTRAVENOUS

## 2014-04-21 MED ORDER — ONDANSETRON HCL 4 MG/2ML IJ SOLN
INTRAMUSCULAR | Status: AC
  Filled 2014-04-21: qty 2

## 2014-04-21 MED ORDER — FENTANYL CITRATE 0.05 MG/ML IJ SOLN
INTRAMUSCULAR | Status: DC | PRN
  Administered 2014-04-21: 25 ug via INTRATHECAL

## 2014-04-21 MED ORDER — NALBUPHINE HCL 10 MG/ML IJ SOLN
2.5000 mg | Freq: Once | INTRAMUSCULAR | Status: AC
  Administered 2014-04-21: 2.5 mg via SUBCUTANEOUS

## 2014-04-21 MED ORDER — SODIUM CHLORIDE 0.9 % IR SOLN
Status: DC | PRN
  Administered 2014-04-21: 1000 mL

## 2014-04-21 MED ORDER — PHENYLEPHRINE HCL 10 MG/ML IJ SOLN
INTRAMUSCULAR | Status: DC | PRN
  Administered 2014-04-21: 80 ug via INTRAVENOUS
  Administered 2014-04-21: 40 ug via INTRAVENOUS
  Administered 2014-04-21 (×2): 80 ug via INTRAVENOUS

## 2014-04-21 MED ORDER — MORPHINE SULFATE (PF) 0.5 MG/ML IJ SOLN
INTRAMUSCULAR | Status: DC | PRN
  Administered 2014-04-21: .1 mg via INTRATHECAL

## 2014-04-21 MED ORDER — PHENYLEPHRINE 40 MCG/ML (10ML) SYRINGE FOR IV PUSH (FOR BLOOD PRESSURE SUPPORT)
PREFILLED_SYRINGE | INTRAVENOUS | Status: AC
  Filled 2014-04-21: qty 10

## 2014-04-21 MED ORDER — KETOROLAC TROMETHAMINE 30 MG/ML IJ SOLN
INTRAMUSCULAR | Status: AC
  Filled 2014-04-21: qty 1

## 2014-04-21 MED ORDER — SCOPOLAMINE 1 MG/3DAYS TD PT72
1.0000 | MEDICATED_PATCH | Freq: Once | TRANSDERMAL | Status: DC
  Administered 2014-04-21: 1.5 mg via TRANSDERMAL

## 2014-04-21 MED ORDER — HYDROMORPHONE HCL 1 MG/ML IJ SOLN
0.2500 mg | INTRAMUSCULAR | Status: DC | PRN
  Administered 2014-04-21: 0.5 mg via INTRAVENOUS

## 2014-04-21 MED ORDER — OXYCODONE-ACETAMINOPHEN 5-325 MG PO TABS
1.0000 | ORAL_TABLET | ORAL | Status: DC | PRN
  Administered 2014-04-22 – 2014-04-23 (×2): 1 via ORAL
  Filled 2014-04-21 (×2): qty 1

## 2014-04-21 MED ORDER — MENTHOL 3 MG MT LOZG: 1.0000 | LOZENGE | OROMUCOSAL | Status: DC | PRN

## 2014-04-21 MED ORDER — NALBUPHINE HCL 10 MG/ML IJ SOLN
INTRAMUSCULAR | Status: AC
  Administered 2014-04-21: 2.5 mg via SUBCUTANEOUS
  Filled 2014-04-21: qty 1

## 2014-04-21 MED ORDER — LACTATED RINGERS IV SOLN: INTRAVENOUS | Status: DC

## 2014-04-21 MED ORDER — MORPHINE SULFATE 0.5 MG/ML IJ SOLN
INTRAMUSCULAR | Status: AC
  Filled 2014-04-21: qty 10

## 2014-04-21 MED ORDER — FENTANYL CITRATE 0.05 MG/ML IJ SOLN
INTRAMUSCULAR | Status: AC
  Filled 2014-04-21: qty 2

## 2014-04-21 MED ORDER — ONDANSETRON HCL 4 MG/2ML IJ SOLN
INTRAMUSCULAR | Status: DC | PRN
  Administered 2014-04-21: 4 mg via INTRAVENOUS

## 2014-04-21 MED ORDER — SCOPOLAMINE 1 MG/3DAYS TD PT72
MEDICATED_PATCH | TRANSDERMAL | Status: AC
  Administered 2014-04-21: 1.5 mg via TRANSDERMAL
  Filled 2014-04-21: qty 1

## 2014-04-21 MED ORDER — DIBUCAINE 1 % RE OINT: 1.0000 "application " | TOPICAL_OINTMENT | RECTAL | Status: DC | PRN

## 2014-04-21 SURGICAL SUPPLY — 32 items
BENZOIN TINCTURE PRP APPL 2/3 (GAUZE/BANDAGES/DRESSINGS) ×2 IMPLANT
CLAMP CORD UMBIL (MISCELLANEOUS) IMPLANT
CLOTH BEACON ORANGE TIMEOUT ST (SAFETY) ×2 IMPLANT
CONTAINER PREFILL 10% NBF 15ML (MISCELLANEOUS) IMPLANT
DRAPE SHEET LG 3/4 BI-LAMINATE (DRAPES) IMPLANT
DRSG OPSITE POSTOP 4X10 (GAUZE/BANDAGES/DRESSINGS) ×2 IMPLANT
DURAPREP 26ML APPLICATOR (WOUND CARE) ×2 IMPLANT
ELECT REM PT RETURN 9FT ADLT (ELECTROSURGICAL) ×2
ELECTRODE REM PT RTRN 9FT ADLT (ELECTROSURGICAL) ×1 IMPLANT
EXTRACTOR VACUUM M CUP 4 TUBE (SUCTIONS) IMPLANT
GLOVE BIO SURGEON STRL SZ 6.5 (GLOVE) ×2 IMPLANT
GOWN STRL REUS W/TWL LRG LVL3 (GOWN DISPOSABLE) ×4 IMPLANT
KIT ABG SYR 3ML LUER SLIP (SYRINGE) IMPLANT
NEEDLE HYPO 25X5/8 SAFETYGLIDE (NEEDLE) IMPLANT
NS IRRIG 1000ML POUR BTL (IV SOLUTION) ×2 IMPLANT
PACK C SECTION WH (CUSTOM PROCEDURE TRAY) ×2 IMPLANT
PAD OB MATERNITY 4.3X12.25 (PERSONAL CARE ITEMS) ×2 IMPLANT
RTRCTR C-SECT PINK 25CM LRG (MISCELLANEOUS) ×2 IMPLANT
STAPLER VISISTAT 35W (STAPLE) IMPLANT
STRIP CLOSURE SKIN 1/2X4 (GAUZE/BANDAGES/DRESSINGS) ×2 IMPLANT
SUT MNCRL 0 VIOLET CTX 36 (SUTURE) ×2 IMPLANT
SUT MONOCRYL 0 CTX 36 (SUTURE) ×2
SUT PLAIN 1 NONE 54 (SUTURE) IMPLANT
SUT PLAIN 2 0 XLH (SUTURE) ×4 IMPLANT
SUT VIC AB 0 CT1 27 (SUTURE) ×2
SUT VIC AB 0 CT1 27XBRD ANBCTR (SUTURE) ×2 IMPLANT
SUT VIC AB 2-0 CT1 27 (SUTURE) ×1
SUT VIC AB 2-0 CT1 TAPERPNT 27 (SUTURE) ×1 IMPLANT
SUT VIC AB 4-0 KS 27 (SUTURE) ×2 IMPLANT
SYR BULB IRRIGATION 50ML (SYRINGE) ×2 IMPLANT
TOWEL OR 17X24 6PK STRL BLUE (TOWEL DISPOSABLE) ×2 IMPLANT
TRAY FOLEY CATH SILVER 14FR (SET/KITS/TRAYS/PACK) ×2 IMPLANT

## 2014-04-21 NOTE — Anesthesia Postprocedure Evaluation (Signed)
  Anesthesia Post-op Note  Patient: Erika Townsend  Procedure(s) Performed: Procedure(s) with comments: CESAREAN SECTION (N/A) - MD requests [facility]FA [facility] confirmed  Patient is awake, responsive, moving her legs, and has signs of resolution of her numbness. Pain and nausea are reasonably well controlled. Vital signs are stable and clinically acceptable. Oxygen saturation is clinically acceptable. There are no apparent anesthetic complications at this time. Patient is ready for discharge.

## 2014-04-21 NOTE — Lactation Note (Signed)
This note was copied from the chart of Erika Rolene CourseJasmine Febus. Lactation Consultation Note Initial visit at 15 hours of age.  Mom is in bed pumping with DEBP and reports the pump "is not working."  Discussed with mom how a DEBP with help evert nipple and stimulate milk production, but does not always empty the breast when she has colostrum.  Mom reports she just wants to bottle feed.  Further discussed with mom she can continue to work on using DEBp every 3 hours/8 times in 24 hours to establish a milk supply.  Encouraged mom she can give formula until she is collecting enough breastmilk.  Mom does not want to continue working on latching baby.  Encouraged mom to call for assist from lactation if she needs assist with breastfeeding or has concerns.  [facility] LC resources given and discussed.  Encouraged to feed with early cues on demand.  Early newborn behavior discussed.  Mom to call for assist as needed.    Patient Name: Erika Townsend Today's Date: 04/21/2014 Reason for consult: Initial assessment   Maternal Data Does the patient have breastfeeding experience prior to this delivery?: No  Feeding    LATCH Score/Interventions                      Lactation Tools Discussed/Used WIC Program: Yes Initiated by:: RN Date initiated:: 04/22/14   Consult Status Consult Status: PRN    Jannifer RodneyShoptaw, Jana Lynn 04/21/2014, 10:54 PM

## 2014-04-21 NOTE — Interval H&P Note (Signed)
History and Physical Interval Note:  04/21/2014 6:54 AM  Erika Townsend  has presented today for surgery, with the diagnosis of Abdominal Cerclage, [phone]  The various methods of treatment have been discussed with the patient and family. After consideration of risks, benefits and other options for treatment, the patient has consented to  Procedure(s) with comments: CESAREAN SECTION (N/A) - MD requests RNFA [facility] confirmed as a surgical intervention .  The patient's history has been reviewed, patient examined, no change in status, stable for surgery.  I have reviewed the patient's chart and labs.  Questions were answered to the patient's satisfaction.     [name]-Stuckert, Shasta Chinn

## 2014-04-21 NOTE — Transfer of Care (Signed)
Immediate Anesthesia Transfer of Care Note  Patient: Erika Townsend  Procedure(s) Performed: Procedure(s) with comments: CESAREAN SECTION (N/A) - MD requests RNFA Joaqu[facility] NamKeela Hyatt confirmed  Patient Location: PACU  Anesthesia Type:Spinal  Level of Consciousness: awake, alert , oriented and patient cooperative  Airway & Oxygen Therapy: Patient Spontanous Breathing  Post-op Assessment: Report given to RN and Post -op Vital signs reviewed and stable  Post vital signs: Reviewed and stable  Last Vitals:  Filed Vitals:   04/21/14 0517  BP: 148/88  Pulse: 105  Temp: 37 C  Resp: 20    Complications: No apparent anesthesia complications

## 2014-04-21 NOTE — Brief Op Note (Signed)
04/21/2014  8:06 AM  PATIENT:  Erika CourseJasmine Burress  24 y.o. female  PRE-OPERATIVE DIAGNOSIS:  primary cesarean section   POST-OPERATIVE DIAGNOSIS:  primary cesarean section   PROCEDURE:  Procedure(s) with comments: CESAREAN SECTION (N/A) - MD requests RNFA Joaquim NamKeela Hyatt confirmed  FINDINGS: viable female infant at 7:34, apgars P weight P, nl PP anat  SURGEON:  Surgeon(s) and Role     * Sherian ReinJody [name]-Stuckert, MD - Primary  ANESTHESIA:   spinal  EBL:  Total I/O In: 2000 [I.V.:2000] Out: 650 [Urine:50; Blood:600]  BLOOD ADMINISTERED:none  DRAINS: Urinary Catheter (Foley)   LOCAL MEDICATIONS USED:  NONE  SPECIMEN:  Source of Specimen:  Placenta  DISPOSITION OF SPECIMEN:  L&D  COUNTS:  YES  TOURNIQUET:  * No tourniquets in log *  DICTATION: .Other Dictation: Dictation Number [id number]  PLAN OF CARE: Admit to inpatient   PATIENT DISPOSITION:  PACU - hemodynamically stable.   Delay start of Pharmacological VTE agent (>24hrs) due to surgical blood loss or risk of bleeding: not applicable

## 2014-04-21 NOTE — Addendum Note (Signed)
Addendum  created 04/21/14 1456 by Elbert Ewingsolleen S Baraa Tubbs, CRNA   Modules edited: Charges VN, Notes Section   Notes Section:  File: [id number]

## 2014-04-21 NOTE — Anesthesia Procedure Notes (Signed)
Spinal Patient location during procedure: OR Preanesthetic Checklist Completed: patient identified, site marked, surgical consent, pre-op evaluation, timeout performed, IV checked, risks and benefits discussed and monitors and equipment checked Spinal Block Patient position: sitting Prep: DuraPrep Patient monitoring: heart rate, cardiac monitor, continuous pulse ox and blood pressure Approach: midline Location: L3-4 Injection technique: single-shot Needle Needle type: Sprotte  Needle gauge: 24 G Needle length: 9 cm Assessment Sensory level: T4 Additional Notes Spinal Dosage in OR  Bupivicaine ml       1.5 PFMS04   mcg        100 Fentanyl mcg            25    

## 2014-04-21 NOTE — Anesthesia Preprocedure Evaluation (Addendum)
Anesthesia Evaluation  Patient identified by MRN, date of birth, ID band  Reviewed: Allergy & Precautions, NPO status , reviewed documented beta blocker date and time   Airway Mallampati: II   Neck ROM: Full    Dental  (+) Teeth Intact, Dental Advisory Given   Pulmonary neg pulmonary ROS,  breath sounds clear to auscultation        Cardiovascular negative cardio ROS  Rhythm:Regular  10/30 H/H Plts   Neuro/Psych negative neurological ROS  negative psych ROS   GI/Hepatic negative GI ROS, Neg liver ROS,   Endo/Other  negative endocrine ROSdiabetesMorbid obesity  Renal/GU negative Renal ROS     Musculoskeletal   Abdominal (+) + obese,   Peds  Hematology   Anesthesia Other Findings   Reproductive/Obstetrics (+) Pregnancy Abd cerclage                           Anesthesia Physical Anesthesia Plan  ASA: III  Anesthesia Plan: Spinal   Post-op Pain Management:    Induction:   Airway Management Planned: Natural Airway and Nasal Cannula  Additional Equipment:   Intra-op Plan:   Post-operative Plan:   Informed Consent: I have reviewed the patients History and Physical, chart, labs and discussed the procedure including the risks, benefits and alternatives for the proposed anesthesia with the patient or authorized representative who has indicated his/her understanding and acceptance.     Plan Discussed with:   Anesthesia Plan Comments:         Anesthesia Quick Evaluation

## 2014-04-21 NOTE — Anesthesia Postprocedure Evaluation (Signed)
  Anesthesia Post-op Note  Patient: Erika Townsend  Procedure(s) Performed: Procedure(s) with comments: CESAREAN SECTION (N/A) - MD requests RNFA Joaquim NamKeela Hyatt confirmed  Patient Location: PACU and Mother/Baby  Anesthesia Type:Spinal  Level of Consciousness: awake, alert  and oriented  Airway and Oxygen Therapy: Patient Spontanous Breathing  Post-op Pain: mild  Post-op Assessment: Post-op Vital signs reviewed, Patient's Cardiovascular Status Stable, Respiratory Function Stable, No signs of Nausea or vomiting, Adequate PO intake, Pain level controlled, No headache, No backache, No residual numbness and No residual motor weakness  Post-op Vital Signs: Reviewed and stable  Last Vitals:  Filed Vitals:   04/21/14 1000  BP: 130/77  Pulse: 80  Temp: 36.7 C  Resp: 16    Complications: No apparent anesthesia complications

## 2014-04-21 NOTE — Op Note (Signed)
Erika Townsend, Erika Townsend              ACCOUNT NO.:  [id number]  MEDICAL RECORD NO.:  [id number]  LOCATION:                                 FACILITY:  PHYSICIAN:  Sherron Monday, MD        DATE OF BIRTH:  April 10, 1990  DATE OF PROCEDURE:  04/21/2014 DATE OF DISCHARGE:                              OPERATIVE REPORT   PREOPERATIVE DIAGNOSES: 1. Intrauterine pregnancy at 39 weeks. 2. Abdominal cerclage. 3. Primary cesarean section.  POSTOPERATIVE DIAGNOSES: 1. Intrauterine pregnancy at 39 weeks, delivered. 2. Abdominal cerclage. 3. Primary cesarean section.  PROCEDURE:  Primary low-transverse cesarean section.  SURGEON:  Sherron Monday, MD.  ASSISTANTEverlean Cherry RNFA  FINDINGS:  Viable female infant at 7:34 a.m. with Apgars pending at the time of dictation as well as weight pending.  Normal postpartum anatomy with normal uterus, tubes, and ovaries.  ANESTHESIA:  Spinal.  IV FLUIDS:  2000 mL.  URINE OUTPUT:  50 mL clear urine at the end of the procedure.  ESTIMATED BLOOD LOSS:  600 mL.  COMPLICATIONS:  None.  PATHOLOGY:  Placenta to L and D.  PROCEDURE IN DETAIL:  After informed consent was reviewed with the patient including risks, benefits, and alternatives of the surgical procedure, she was transported in stable condition to the operating room, where spinal anesthesia was placed and found to be adequate.  She was then turned in supine position with a leftward tilt.  Foley catheter was sterilely placed.  Her abdomen and vagina were prepped in the normal sterile fashion.  A Pfannenstiel skin incision was made at the level of approximately 2 fingerbreadths above the pubic symphysis and carried through the underlying layer of fascia sharply.  Fascia was incised in the midline.  The incision was extended laterally with Mayo scissors. The superior aspect of the fascial incision grasped with clamps elevating the rectus muscles were dissected off both bluntly and sharply.   Midline was easily identified.  The peritoneum was entered bluntly.  The Alexis skin retractor was carefully placed making sure no bowel was entrapped.  Uterus was incised in transverse fashion.  Infant was delivered with the aid of the vacuum without complication.  Nose and mouth were suctioned on the field.  Cord was clamped and cut.  Infant was handed off to the awaiting pediatric staff.  The placenta was expressed from the uterus.  Handed to cord blood collection.  The uterus was cleared of all clot and debris.  The uterine incision was closed with 2 layers of 0 Monocryl, first of which was a running locked and second in an imbricating layer.  Hemostasis was assured.  The gutters were cleared of all clot and debris.  Inspection revealed normal tubes and ovaries.  The peritoneum was reapproximated using 2-0 Vicryl. Fascia was closed with 0 Vicryl in a single suture.  The subcuticular adipose tissue was reapproximated with 3-0 plain gut.  The skin was closed with 4-0 Vicryl with a Mellody Dance needle.  Sponge, lap, and needle counts were correct x2 per the operating staff.  Benzoin and Steri- Strips were applied.  The patient tolerated the procedure well and was transferred in stable condition to the  PACU.     Sherron MondayJody [name], MD     JB/MEDQ  D:  04/21/2014  T:  04/21/2014  Job:  [id number]

## 2014-04-22 ENCOUNTER — Encounter (HOSPITAL_COMMUNITY): Payer: Self-pay | Admitting: Obstetrics and Gynecology

## 2014-04-22 LAB — CBC
HCT: 29 % — ABNORMAL LOW (ref 36.0–46.0)
Hemoglobin: 9.4 g/dL — ABNORMAL LOW (ref 12.0–15.0)
MCH: 26.5 pg (ref 26.0–34.0)
MCHC: 32.4 g/dL (ref 30.0–36.0)
MCV: 81.7 fL (ref 78.0–100.0)
Platelets: 173 10*3/uL (ref 150–400)
RBC: 3.55 MIL/uL — ABNORMAL LOW (ref 3.87–5.11)
RDW: 14.6 % (ref 11.5–15.5)
WBC: 11.3 10*3/uL — ABNORMAL HIGH (ref 4.0–10.5)

## 2014-04-22 LAB — BIRTH TISSUE RECOVERY COLLECTION (PLACENTA DONATION)

## 2014-04-22 NOTE — Progress Notes (Signed)
Subjective: Postpartum Day 1: Cesarean Delivery Patient reports incisional pain and tolerating PO.  Nl lochia, pain controlled  Objective: Vital signs in last 24 hours: Temp:  [97 F (36.1 C)-98.5 F (36.9 C)] 98.3 F (36.8 C) (04/12 0603) Pulse Rate:  [76-96] 90 (04/12 0603) Resp:  [11-20] 18 (04/12 0603) BP: (105-141)/(58-88) 127/80 mmHg (04/12 0603) SpO2:  [97 %-100 %] 97 % (04/12 0603)  Physical Exam:  General: alert and no distress Lochia: appropriate Uterine Fundus: firm Incision: healing well DVT Evaluation: No evidence of DVT seen on physical exam.   Recent Labs  04/22/14 0530  HGB 9.4*  HCT 29.0*    Assessment/Plan: Status post Cesarean section. Doing well postoperatively.  Continue current care/ routine care.  [name]-Stuckert, Giavanni Odonovan 04/22/2014, 7:54 AM

## 2014-04-23 NOTE — Progress Notes (Signed)
Subjective: Postpartum Day 2: Cesarean Delivery Patient reports incisional pain and tolerating PO.  Nl lochia, pain controlled  Objective: Vital signs in last 24 hours: Temp:  [98 F (36.7 C)-98.5 F (36.9 C)] 98 F (36.7 C) (04/13 0615) Pulse Rate:  [71-84] 84 (04/13 0615) Resp:  [18] 18 (04/13 0615) BP: (108-135)/(73-80) 135/80 mmHg (04/13 0615)  Physical Exam:  General: alert and no distress Lochia: appropriate Uterine Fundus: firm Incision: healing well DVT Evaluation: No evidence of DVT seen on physical exam.   Recent Labs  04/22/14 0530  HGB 9.4*  HCT 29.0*    Assessment/Plan: Status post Cesarean section. Doing well postoperatively.  Continue current care.  [name]-Stuckert, Kavian Peters 04/23/2014, 7:45 AM

## 2014-04-24 MED ORDER — OXYCODONE-ACETAMINOPHEN 5-325 MG PO TABS: 1.0000 | ORAL_TABLET | Freq: Four times a day (QID) | ORAL | Status: DC | PRN

## 2014-04-24 MED ORDER — PRENATAL MULTIVITAMIN CH: 1.0000 | ORAL_TABLET | Freq: Every day | ORAL | Status: DC

## 2014-04-24 MED ORDER — IBUPROFEN 800 MG PO TABS: 800.0000 mg | ORAL_TABLET | Freq: Three times a day (TID) | ORAL | Status: DC

## 2014-04-24 NOTE — Progress Notes (Signed)
Subjective: Postpartum Day 3: Cesarean Delivery Patient reports incisional pain, tolerating PO and no problems voiding.  Nl lochia, pain controlled  Objective: Vital signs in last 24 hours: Temp:  [98.3 F (36.8 C)-98.7 F (37.1 C)] 98.3 F (36.8 C) (04/14 0639) Pulse Rate:  [79-88] 88 (04/14 0639) Resp:  [18] 18 (04/14 0639) BP: (136-142)/(73-79) 142/73 mmHg (04/14 16100639)  Physical Exam:  General: alert and no distress Lochia: appropriate Uterine Fundus: firm Incision: healing well DVT Evaluation: No evidence of DVT seen on physical exam.  Recent Labs  04/22/14 0530  HGB 9.4*  HCT 29.0*    Assessment/Plan: Status post Cesarean section. Doing well postoperatively.  Discharge home with standard precautions and return to clinic in 2 weeks.  D/c with motrin, percocet, and PNV.    [name]-Stuckert, Kieryn Burtis 04/24/2014, 7:39 AM

## 2014-04-24 NOTE — Discharge Summary (Signed)
Obstetric Discharge Summary Reason for Admission: cesarean section Prenatal Procedures: none Intrapartum Procedures: cesarean: low cervical, transverse Postpartum Procedures: none Complications-Operative and Postpartum: none HEMOGLOBIN  Date Value Ref Range Status  04/22/2014 9.4* 12.0 - 15.0 g/dL Final   HCT  Date Value Ref Range Status  04/22/2014 29.0* 36.0 - 46.0 % Final    Physical Exam:  General: alert and no distress Lochia: appropriate Uterine Fundus: firm Incision: healing well DVT Evaluation: No evidence of DVT seen on physical exam.  Discharge Diagnoses: Term Pregnancy-delivered, poor OB history  Discharge Information: Date: 04/24/2014 Activity: pelvic rest Diet: routine Medications: PNV, Ibuprofen and Percocet Condition: stable Instructions: refer to practice specific booklet Discharge to: home Follow-up Information    Follow up with [name]-Stuckert, Hilde Churchman, MD. Schedule an appointment as soon as possible for a visit in 2 weeks.   Specialty:  Obstetrics and Gynecology   Why:  for incision check, 6 weeks for full postpartum check   Contact information:   [address] [place] [address] [phone]       Newborn Data: Live born female  Birth Weight: 6 lb 7.7 oz (2940 g) APGAR: 9, 10  Home with mother.  [name]-Stuckert, Cephas Revard 04/24/2014, 7:45 AM

## 2014-08-19 ENCOUNTER — Ambulatory Visit: Payer: BLUE CROSS/BLUE SHIELD | Admitting: Family

## 2014-09-05 ENCOUNTER — Emergency Department (HOSPITAL_COMMUNITY)
Admission: EM | Admit: 2014-09-05 | Discharge: 2014-09-05 | Discharge status: Absent without leave | Payer: Managed Care, Other (non HMO) | Source: Home / Self Care

## 2015-01-14 ENCOUNTER — Emergency Department (HOSPITAL_COMMUNITY)
Admission: EM | Admit: 2015-01-14 | Discharge: 2015-01-15 | Disposition: A | Discharge status: Home / Self Care | Payer: BLUE CROSS/BLUE SHIELD | Attending: Emergency Medicine | Admitting: Emergency Medicine

## 2015-01-14 ENCOUNTER — Encounter (HOSPITAL_COMMUNITY): Payer: Self-pay | Admitting: Emergency Medicine

## 2015-01-14 DIAGNOSIS — J111 Influenza due to unidentified influenza virus with other respiratory manifestations: Secondary | ICD-10-CM | POA: Diagnosis not present

## 2015-01-14 DIAGNOSIS — Z880 Allergy status to penicillin: Secondary | ICD-10-CM | POA: Insufficient documentation

## 2015-01-14 DIAGNOSIS — Z87828 Personal history of other (healed) physical injury and trauma: Secondary | ICD-10-CM | POA: Diagnosis not present

## 2015-01-14 DIAGNOSIS — Z8742 Personal history of other diseases of the female genital tract: Secondary | ICD-10-CM | POA: Diagnosis not present

## 2015-01-14 DIAGNOSIS — R509 Fever, unspecified: Secondary | ICD-10-CM | POA: Diagnosis present

## 2015-01-14 DIAGNOSIS — E119 Type 2 diabetes mellitus without complications: Secondary | ICD-10-CM | POA: Diagnosis not present

## 2015-01-14 NOTE — ED Notes (Signed)
Pt brought in by EMS with c/o fever, headache, nausea, and vomiting  Onset this morning   Pt took ibuprofen earlier today without relief and pt was given 1 gm of tylenol by EMS   Pt states she has general body aches and the light is hurting her eyes

## 2015-01-15 DIAGNOSIS — J111 Influenza due to unidentified influenza virus with other respiratory manifestations: Secondary | ICD-10-CM | POA: Diagnosis not present

## 2015-01-15 LAB — URINALYSIS, ROUTINE W REFLEX MICROSCOPIC
GLUCOSE, UA: NEGATIVE mg/dL
KETONES UR: NEGATIVE mg/dL
Leukocytes, UA: NEGATIVE
Nitrite: NEGATIVE
PH: 6 (ref 5.0–8.0)
PROTEIN: 100 mg/dL — AB
Specific Gravity, Urine: 1.031 — ABNORMAL HIGH (ref 1.005–1.030)

## 2015-01-15 LAB — URINE MICROSCOPIC-ADD ON

## 2015-01-15 MED ORDER — ONDANSETRON 4 MG PO TBDP: 4.0000 mg | ORAL_TABLET | Freq: Three times a day (TID) | ORAL | Status: DC | PRN

## 2015-01-15 MED ORDER — SODIUM CHLORIDE 0.9 % IV BOLUS (SEPSIS)
1000.0000 mL | Freq: Once | INTRAVENOUS | Status: AC
  Administered 2015-01-15: 1000 mL via INTRAVENOUS

## 2015-01-15 MED ORDER — ONDANSETRON 4 MG PO TBDP
4.0000 mg | ORAL_TABLET | Freq: Once | ORAL | Status: AC
  Administered 2015-01-15: 4 mg via ORAL
  Filled 2015-01-15: qty 1

## 2015-01-15 MED ORDER — OSELTAMIVIR PHOSPHATE 75 MG PO CAPS
75.0000 mg | ORAL_CAPSULE | ORAL | Status: AC
  Administered 2015-01-15: 75 mg via ORAL
  Filled 2015-01-15: qty 1

## 2015-01-15 MED ORDER — IBUPROFEN 600 MG PO TABS: 600.0000 mg | ORAL_TABLET | Freq: Four times a day (QID) | ORAL | Status: DC | PRN

## 2015-01-15 MED ORDER — IBUPROFEN 200 MG PO TABS
600.0000 mg | ORAL_TABLET | Freq: Once | ORAL | Status: AC
  Administered 2015-01-15: 600 mg via ORAL
  Filled 2015-01-15: qty 3

## 2015-01-15 MED ORDER — OSELTAMIVIR PHOSPHATE 75 MG PO CAPS: 75.0000 mg | ORAL_CAPSULE | Freq: Two times a day (BID) | ORAL | Status: DC

## 2015-01-15 NOTE — ED Provider Notes (Signed)
CSN: [id number]     Arrival date & time 01/14/15  2106 History   First MD Initiated Contact with Patient 01/15/15 0007     Chief Complaint  Patient presents with  . Fever  . Generalized Body Aches     (Consider location/radiation/quality/duration/timing/severity/associated sxs/prior Treatment) HPI Comments: Morbidly obesity.  25-year-old female who reports emergency department tonight complaining of generalized myalgias, back pain, nausea and vomiting.  Denies any dysuria, vaginal discharge, constipation or diarrhea.  Is un aware of any fever.  Does not report chills.  Has no rhinitis or sore throat  Patient is a 25 y.o. female presenting with fever. The history is provided by the patient.  Fever Severity:  Unable to specify Onset quality:  Unable to specify Timing:  Unable to specify Progression:  Unable to specify Chronicity:  New Relieved by:  Acetaminophen Worsened by:  Nothing tried Associated symptoms: headaches, myalgias, nausea and vomiting   Associated symptoms: no chills, no cough, no dysuria, no ear pain, no rhinorrhea and no sore throat   Myalgias:    Location:  Back   Severity:  Mild   Onset quality:  Gradual   Duration:  1 day Nausea:    Severity:  Mild   Onset quality:  Unable to specify   Past Medical History  Diagnosis Date  . Endometritis following delivery 12/03/2012  . Asherman syndrome   . Incompetence of cervix   . PCOS (polycystic ovarian syndrome)   . Diabetes mellitus without complication (HCC)     pt states has problems with BS, but not diabetic  . Traumatic injury during pregnancy in third trimester 03/23/2014  . Abnormal glucose tolerance in pregnancy 03/23/2014  . Cervical cerclage suture present in third trimester 04/19/2014  . S/P cesarean section 04/21/2014   Past Surgical History  Procedure Laterality Date  . Dilation and curettage of uterus  01/26/2012    following a miscarriage  . Cervical cerclage N/A 11/10/2012    Procedure: CERCLAGE  CERVICAL;  Surgeon: Oliver Pila, MD;  Location: [facility];  Service: Gynecology;  Laterality: N/A;  . Dilation and evacuation N/A 12/01/2012    Procedure: DILATATION AND EVACUATION;  Surgeon: Sherron Monday, MD;  Location: [facility];  Service: Gynecology;  Laterality: N/A;  . Cervical cerclage N/A 12/01/2012    Procedure: CERCLAGE CERVICAL REMOVAL;  Surgeon: Sherron Monday, MD;  Location: [facility];  Service: Gynecology;  Laterality: N/A;  . Dilation and evacuation N/A 12/03/2012    Procedure: DILATATION AND EVACUATION;  Surgeon: Sherron Monday, MD;  Location: [facility];  Service: Gynecology;  Laterality: N/A;  . Hysteroscopy w/d&c N/A 02/06/2013    Procedure:  SUCTION D&C HYSTEROSCOPY/LYSIS OF ADHESIONS/ INTRAOP HSG COOK UTERINE STENT PLACEMENT;  Surgeon: Fermin Schwab, MD;  Location: [facility];  Service: Gynecology;  Laterality: N/A;  . Cesarean section N/A 04/21/2014    Procedure: CESAREAN SECTION;  Surgeon: Sherian Rein, MD;  Location: [facility];  Service: Obstetrics;  Laterality: N/A;  MD requests RNFA [facility] confirmed   Family History  Problem Relation Age of Onset  . Stroke Maternal Grandmother   . Heart disease Maternal Grandmother   . Cancer Maternal Grandfather    Social History  Substance Use Topics  . Smoking status: Never Smoker   . Smokeless tobacco: Never Used  . Alcohol Use: No   OB History    Gravida Para Term Preterm AB TAB SAB Ectopic Multiple Living   3 1 1  2  2   0 1  Review of Systems  Constitutional: Positive for fever. Negative for chills.  HENT: Negative for ear pain, rhinorrhea and sore throat.   Respiratory: Negative for cough and shortness of breath.   Gastrointestinal: Positive for nausea and vomiting. Negative for abdominal pain.  Genitourinary: Negative for dysuria and flank pain.  Musculoskeletal: Positive for myalgias.  Neurological: Positive for headaches.  All other systems reviewed and are negative.     Allergies   Cinnamon; Penicillins; Diphenhydramine; Flagyl; and Tape  Home Medications   Prior to Admission medications   Medication Sig Start Date End Date Taking? Authorizing Provider  acetaminophen (TYLENOL) 500 MG tablet Take 1,000 mg by mouth every 6 (six) hours as needed for mild pain, moderate pain or headache.   Yes Historical Provider, MD  ibuprofen (ADVIL,MOTRIN) 600 MG tablet Take 1 tablet (600 mg total) by mouth every 6 (six) hours as needed for moderate pain. 01/15/15   Earley FavorGail Arieal Cuoco, NP  ondansetron (ZOFRAN-ODT) 4 MG disintegrating tablet Take 1 tablet (4 mg total) by mouth every 8 (eight) hours as needed for nausea or vomiting. 01/15/15   Earley FavorGail Mariateresa Batra, NP  oseltamivir (TAMIFLU) 75 MG capsule Take 1 capsule (75 mg total) by mouth 2 (two) times daily. 01/15/15   Earley FavorGail Jordell Outten, NP  Prenatal Vit-Fe Fumarate-FA (PRENATAL MULTIVITAMIN) TABS tablet Take 1 tablet by mouth daily at 12 noon. Patient not taking: Reported on 01/14/2015 04/24/14   Jody [name]-Stuckert, MD   BP 101/67 mmHg  Pulse 80  Temp(Src) 99.6 F (37.6 C) (Oral)  Resp 16  Ht 5\' 4"  (1.626 m)  Wt 95.255 kg  BMI 36.03 kg/m2  SpO2 95% Physical Exam  Constitutional: She appears well-developed and well-nourished.  HENT:  Head: Normocephalic.  Eyes: Pupils are equal, round, and reactive to light.  Neck: Normal range of motion.  Cardiovascular: Normal rate and regular rhythm.   Pulmonary/Chest: Effort normal.  Nursing note and vitals reviewed.   ED Course  Procedures (including critical care time) Labs Review Labs Reviewed  URINALYSIS, ROUTINE W REFLEX MICROSCOPIC (NOT [facility]) - Abnormal; Notable for the following:    Color, Urine AMBER (*)    APPearance CLOUDY (*)    Specific Gravity, Urine 1.031 (*)    Hgb urine dipstick MODERATE (*)    Bilirubin Urine SMALL (*)    Protein, ur 100 (*)    All other components within normal limits  URINE MICROSCOPIC-ADD ON - Abnormal; Notable for the following:    Squamous Epithelial / LPF 6-30  (*)    Bacteria, UA RARE (*)    All other components within normal limits    Imaging Review No results found. I have personally reviewed and evaluated these images and lab results as part of my medical decision-making.   EKG Interpretation None     Acute onset of patient's symptoms is consistent with influenza.  She has been given IV fluids in the emergency department, as well as anti-inflammatories and her first dose of Tamiflu.  She's been given a prescription for ibuprofen, and temperature to take on a regular basis at home. MDM   Final diagnoses:  Influenza         Earley FavorGail Denette Hass, NP 01/15/15 [id number]  Gerhard Munchobert Lockwood, MD 01/16/15 [phone]

## 2015-01-15 NOTE — Discharge Instructions (Signed)

## 2015-01-15 NOTE — ED Notes (Signed)
Discharge instructions, follow up care, rx x3 reviewed with patient. Patient verbalized understanding. Patient states she has a friend picking her up.

## 2015-12-06 ENCOUNTER — Encounter (HOSPITAL_COMMUNITY): Payer: Self-pay | Admitting: *Deleted

## 2015-12-06 ENCOUNTER — Inpatient Hospital Stay (HOSPITAL_COMMUNITY): Payer: Medicaid Other

## 2015-12-06 ENCOUNTER — Inpatient Hospital Stay (HOSPITAL_COMMUNITY)
Admission: AD | Admit: 2015-12-06 | Discharge: 2015-12-06 | Disposition: A | Discharge status: Home / Self Care | Payer: Medicaid Other | Source: Ambulatory Visit | Attending: Obstetrics and Gynecology | Admitting: Obstetrics and Gynecology

## 2015-12-06 DIAGNOSIS — Z79899 Other long term (current) drug therapy: Secondary | ICD-10-CM | POA: Insufficient documentation

## 2015-12-06 DIAGNOSIS — N856 Intrauterine synechiae: Secondary | ICD-10-CM | POA: Insufficient documentation

## 2015-12-06 DIAGNOSIS — E282 Polycystic ovarian syndrome: Secondary | ICD-10-CM | POA: Insufficient documentation

## 2015-12-06 DIAGNOSIS — Z9889 Other specified postprocedural states: Secondary | ICD-10-CM | POA: Insufficient documentation

## 2015-12-06 DIAGNOSIS — Z888 Allergy status to other drugs, medicaments and biological substances status: Secondary | ICD-10-CM | POA: Diagnosis not present

## 2015-12-06 DIAGNOSIS — Z823 Family history of stroke: Secondary | ICD-10-CM | POA: Diagnosis not present

## 2015-12-06 DIAGNOSIS — Z809 Family history of malignant neoplasm, unspecified: Secondary | ICD-10-CM | POA: Diagnosis not present

## 2015-12-06 DIAGNOSIS — R1032 Left lower quadrant pain: Secondary | ICD-10-CM | POA: Diagnosis not present

## 2015-12-06 DIAGNOSIS — R52 Pain, unspecified: Secondary | ICD-10-CM

## 2015-12-06 DIAGNOSIS — Z8249 Family history of ischemic heart disease and other diseases of the circulatory system: Secondary | ICD-10-CM | POA: Diagnosis not present

## 2015-12-06 DIAGNOSIS — Z30431 Encounter for routine checking of intrauterine contraceptive device: Secondary | ICD-10-CM | POA: Insufficient documentation

## 2015-12-06 DIAGNOSIS — E860 Dehydration: Secondary | ICD-10-CM | POA: Diagnosis not present

## 2015-12-06 DIAGNOSIS — Z880 Allergy status to penicillin: Secondary | ICD-10-CM | POA: Insufficient documentation

## 2015-12-06 DIAGNOSIS — N83202 Unspecified ovarian cyst, left side: Secondary | ICD-10-CM

## 2015-12-06 HISTORY — DX: Gestational diabetes mellitus in pregnancy, unspecified control: O24.419

## 2015-12-06 LAB — URINALYSIS, ROUTINE W REFLEX MICROSCOPIC
BILIRUBIN URINE: NEGATIVE
Glucose, UA: NEGATIVE mg/dL
KETONES UR: NEGATIVE mg/dL
Leukocytes, UA: NEGATIVE
Nitrite: NEGATIVE
PH: 6.5 (ref 5.0–8.0)
Protein, ur: NEGATIVE mg/dL
Specific Gravity, Urine: 1.02 (ref 1.005–1.030)

## 2015-12-06 LAB — WET PREP, GENITAL
CLUE CELLS WET PREP: NONE SEEN
Sperm: NONE SEEN
TRICH WET PREP: NONE SEEN
Yeast Wet Prep HPF POC: NONE SEEN

## 2015-12-06 LAB — URINE MICROSCOPIC-ADD ON

## 2015-12-06 LAB — POCT PREGNANCY, URINE: Preg Test, Ur: NEGATIVE

## 2015-12-06 NOTE — MAU Provider Note (Signed)
History   Patient Erika Townsend is a 25 year old G3P1021 here with abdominal pain for the past two months. She has an abdominal cerclage in place and an IUD. She states that she thinks this could be her c-section scar hurting her, or her IUD or cerclage.  She has an appointment with her doctor on Monday but she wanted to come in today. (She can't remember the name of the doctor.)   CSN: [id number]  Arrival date and time: 12/06/15 0865   First Provider Initiated Contact with Patient 12/06/15 [phone]      Chief Complaint  Patient presents with  . Abdominal Pain  . Dehydration   Abdominal Pain  This is a recurrent problem. The current episode started more than 1 month ago. The onset quality is undetermined. The problem occurs intermittently. The problem has been gradually worsening. The pain is located in the suprapubic region. The pain is at a severity of 10/10. The pain is moderate. The quality of the pain is aching and cramping. Associated symptoms include dysuria. Pertinent negatives include no anorexia, arthralgias, belching, constipation, diarrhea, fever, flatus, frequency, headaches, hematochezia, hematuria, melena, myalgias, nausea or weight loss. Nothing aggravates the pain. The pain is relieved by nothing. Her past medical history is significant for abdominal surgery.    OB History    Gravida Para Term Preterm AB Living   3 1 1   2 1    SAB TAB Ectopic Multiple Live Births   2     0 1      Past Medical History:  Diagnosis Date  . Abnormal glucose tolerance in pregnancy 03/23/2014  . Asherman syndrome   . Cervical cerclage suture present in third trimester 04/19/2014  . Diabetes mellitus without complication (HCC)    pt states has problems with BS, but not diabetic  . Endometritis following delivery 12/03/2012  . Gestational diabetes   . Incompetence of cervix   . PCOS (polycystic ovarian syndrome)   . S/P cesarean section 04/21/2014  . Traumatic injury during pregnancy in  third trimester 03/23/2014    Past Surgical History:  Procedure Laterality Date  . CERVICAL CERCLAGE N/A 11/10/2012   Procedure: CERCLAGE CERVICAL;  Surgeon: Oliver Pila, MD;  Location: [facility];  Service: Gynecology;  Laterality: N/A;  . CERVICAL CERCLAGE N/A 12/01/2012   Procedure: CERCLAGE CERVICAL REMOVAL;  Surgeon: Sherron Monday, MD;  Location: [facility];  Service: Gynecology;  Laterality: N/A;  . CESAREAN SECTION N/A 04/21/2014   Procedure: CESAREAN SECTION;  Surgeon: Sherian Rein, MD;  Location: [facility];  Service: Obstetrics;  Laterality: N/A;  MD requests RNFA [facility] confirmed  . DILATION AND CURETTAGE OF UTERUS  01/26/2012   following a miscarriage  . DILATION AND EVACUATION N/A 12/01/2012   Procedure: DILATATION AND EVACUATION;  Surgeon: Sherron Monday, MD;  Location: [facility];  Service: Gynecology;  Laterality: N/A;  . DILATION AND EVACUATION N/A 12/03/2012   Procedure: DILATATION AND EVACUATION;  Surgeon: Sherron Monday, MD;  Location: [facility];  Service: Gynecology;  Laterality: N/A;  . HYSTEROSCOPY W/D&C N/A 02/06/2013   Procedure:  SUCTION D&C HYSTEROSCOPY/LYSIS OF ADHESIONS/ INTRAOP HSG COOK UTERINE STENT PLACEMENT;  Surgeon: Fermin Schwab, MD;  Location: [facility];  Service: Gynecology;  Laterality: N/A;    Family History  Problem Relation Age of Onset  . Stroke Maternal Grandmother   . Heart disease Maternal Grandmother   . Cancer Maternal Grandfather     Social History  Substance Use Topics  .  Smoking status: Never Smoker  . Smokeless tobacco: Never Used  . Alcohol use No    Allergies:  Allergies  Allergen Reactions  . Cinnamon Shortness Of Breath  . Penicillins Shortness Of Breath and Swelling    Has patient had a PCN reaction causing immediate rash, facial/tongue/throat swelling, SOB or lightheadedness with hypotension: Yes Has patient had a PCN reaction causing severe rash involving mucus membranes or skin necrosis: No Has patient had  a PCN reaction that required hospitalization: Yes Has patient had a PCN reaction occurring within the last 10 years: No If all of the above answers are "NO", then may proceed with Cephalosporin use.   . Diphenhydramine Hives  . Flagyl [Metronidazole] Itching and Other (See Comments)    Reaction:  Bumps in the mouth   . Tape Rash    Prescriptions Prior to Admission  Medication Sig Dispense Refill Last Dose  . acetaminophen (TYLENOL) 500 MG tablet Take 1,000 mg by mouth every 6 (six) hours as needed for mild pain, moderate pain or headache.   12/05/2015 at Unknown time  . ibuprofen (ADVIL,MOTRIN) 600 MG tablet Take 1 tablet (600 mg total) by mouth every 6 (six) hours as needed for moderate pain. 30 tablet 0 12/05/2015 at Unknown time  . ondansetron (ZOFRAN-ODT) 4 MG disintegrating tablet Take 1 tablet (4 mg total) by mouth every 8 (eight) hours as needed for nausea or vomiting. 20 tablet 0   . oseltamivir (TAMIFLU) 75 MG capsule Take 1 capsule (75 mg total) by mouth 2 (two) times daily. 13 capsule 0   . Prenatal Vit-Fe Fumarate-FA (PRENATAL MULTIVITAMIN) TABS tablet Take 1 tablet by mouth daily at 12 noon. (Patient not taking: Reported on 01/14/2015) 30 tablet 12 Not Taking at Unknown time    Review of Systems  Constitutional: Negative for fever and weight loss.  Gastrointestinal: Positive for abdominal pain. Negative for anorexia, constipation, diarrhea, flatus, hematochezia, melena and nausea.  Genitourinary: Positive for dysuria. Negative for frequency and hematuria.  Musculoskeletal: Negative for arthralgias and myalgias.  Neurological: Negative for headaches.   Physical Exam   Blood pressure 119/69, pulse 83, temperature 97.6 F (36.4 C), resp. rate 18, height 5\' 4"  (1.626 m), weight 121 kg (266 lb 12.8 oz), unknown if currently breastfeeding.  Physical Exam  Constitutional: She is oriented to person, place, and time. She appears well-developed.  HENT:  Head: Normocephalic.   Respiratory: Effort normal. No respiratory distress.  GI: Soft. Bowel sounds are normal. She exhibits no distension and no mass. There is tenderness. There is no rebound and no guarding.  Suprapubic region is tender to palpation. C-Section scar is faded and white; no redness or swelling visible.   Genitourinary: Vagina normal and uterus normal.  Genitourinary Comments: External labia are normal-appearing, no lesions. Scant white discharge in the vagina, no odor. Vaginal walls are pink and non-erythematous. Cervix is pink with no lesions; IUD strings visible. No CMT, suprapubic region is tender to palpation.   Musculoskeletal: Normal range of motion.  Neurological: She is alert and oriented to person, place, and time. She has normal reflexes.  Skin: Skin is warm and dry.    MAU Course  Procedures Results for orders placed or performed during the hospital encounter of 12/06/15 (from the past 24 hour(s))  Urinalysis, Routine w reflex microscopic ([facility])     Status: Abnormal   Collection Time: 12/06/15  6:15 AM  Result Value Ref Range   Color, Urine YELLOW YELLOW   APPearance CLEAR CLEAR  Specific Gravity, Urine 1.020 1.005 - 1.030   pH 6.5 5.0 - 8.0   Glucose, UA NEGATIVE NEGATIVE mg/dL   Hgb urine dipstick SMALL (A) NEGATIVE   Bilirubin Urine NEGATIVE NEGATIVE   Ketones, ur NEGATIVE NEGATIVE mg/dL   Protein, ur NEGATIVE NEGATIVE mg/dL   Nitrite NEGATIVE NEGATIVE   Leukocytes, UA NEGATIVE NEGATIVE  Urine microscopic-add on     Status: Abnormal   Collection Time: 12/06/15  6:15 AM  Result Value Ref Range   Squamous Epithelial / LPF 0-5 (A) NONE SEEN   WBC, UA 6-30 0 - 5 WBC/hpf   RBC / HPF 0-5 0 - 5 RBC/hpf   Bacteria, UA RARE (A) NONE SEEN   Urine-Other MUCOUS PRESENT   Pregnancy, urine POC     Status: None   Collection Time: 12/06/15  6:19 AM  Result Value Ref Range   Preg Test, Ur NEGATIVE NEGATIVE  Wet prep, genital     Status: Abnormal   Collection Time: 12/06/15   6:40 AM  Result Value Ref Range   Yeast Wet Prep HPF POC NONE SEEN NONE SEEN   Trich, Wet Prep NONE SEEN NONE SEEN   Clue Cells Wet Prep HPF POC NONE SEEN NONE SEEN   WBC, Wet Prep HPF POC FEW (A) NONE SEEN   Sperm NONE SEEN    CLINICAL DATA:  Patient with pelvic pain. Evaluate intrauterine device placement.  EXAM: TRANSABDOMINAL AND TRANSVAGINAL ULTRASOUND OF PELVIS  TECHNIQUE: Both transabdominal and transvaginal ultrasound examinations of the pelvis were performed. Transabdominal technique was performed for global imaging of the pelvis including uterus, ovaries, adnexal regions, and pelvic cul-de-sac. It was necessary to proceed with endovaginal exam following the transabdominal exam to visualize the endometrium.  COMPARISON:  None  FINDINGS: Uterus  Measurements: 9.8 x 4.6 x 5.6 cm. No fibroids or other mass visualized. Echogenic structure suggestive of cerclage visualized.  Endometrium  Thickness: 4.9 mm. Intrauterine device appears appropriately positioned.  Right ovary  Measurements: 4.0 x 2.3 x 3.1 cm. Normal appearance/no adnexal mass.  Left ovary  Measurements: 4.7 x 3.1 x 4.1 cm. There is a 3.8 x 2.7 x 3.0 cm cystic structure with internal echogenicity within the left ovary.  Other findings  No abnormal free fluid.  IMPRESSION: Intrauterine device appears appropriately located.  Probable hemorrhagic cyst within the left ovary. Recommend follow-up pelvic ultrasound in 6- 8 weeks to assess for resolution.   MDM -wet prep-negative for clue, yeast -GC CT cultures sent  -pelvic US to check IUD placement -UPT-negative -UA-normal  Care assumed by Lilyan Punt. Clarine Elrod, NP at [phone]  [phone]  Awakend client from sleep and she requested discharge papers.  Did not want to discuss ultrasound results - "I know everything is OK - I want to go home"  Assessment and Plan  IUD in proper position Small left ovarian cyst.  Plan Keep appointment you  have on Monday for follow up.  Tell your doctor you were here today. Take the pain medications you have as needed.  Nolene Bernheimerri Adreena Willits, NP  Charlesetta GaribaldiKathryn Lorraine Kooistra CNM 12/06/2015, 6:55 AM

## 2015-12-06 NOTE — Discharge Instructions (Signed)
Keep appointment you have on Monday for follow up.  Tell your doctor you were here today. Take the pain medications you have as needed. Drink at least 8 8-oz glasses of water every day. Your IUD is in the correct place. You have a small ovarian cyst that you can discuss with your doctor at your appointment.

## 2015-12-06 NOTE — MAU Note (Addendum)
With my baby born April 2016 I had an abdominal cerclage and then c/s.  For about a month I have had intermittent pain in lower abd when I move or sometimes when I pee. Denies vag bleeding and some white d/c but no odor. Have tried Ibuprofen but does not help. Tylenol did not help

## 2015-12-07 ENCOUNTER — Encounter: Payer: Medicaid Other | Admitting: Obstetrics and Gynecology

## 2015-12-09 LAB — GC/CHLAMYDIA PROBE AMP (~~LOC~~) NOT AT ARMC
Chlamydia: NEGATIVE
Neisseria Gonorrhea: NEGATIVE

## 2015-12-24 ENCOUNTER — Encounter: Payer: Medicaid Other | Admitting: Certified Nurse Midwife

## 2016-03-20 ENCOUNTER — Ambulatory Visit (HOSPITAL_COMMUNITY)
Admission: EM | Admit: 2016-03-20 | Discharge: 2016-03-20 | Disposition: A | Discharge status: Home / Self Care | Payer: Medicaid Other | Attending: Internal Medicine | Admitting: Internal Medicine

## 2016-03-20 ENCOUNTER — Encounter (HOSPITAL_COMMUNITY): Payer: Self-pay | Admitting: Emergency Medicine

## 2016-03-20 DIAGNOSIS — B373 Candidiasis of vulva and vagina: Secondary | ICD-10-CM

## 2016-03-20 DIAGNOSIS — R311 Benign essential microscopic hematuria: Secondary | ICD-10-CM

## 2016-03-20 DIAGNOSIS — B3731 Acute candidiasis of vulva and vagina: Secondary | ICD-10-CM

## 2016-03-20 LAB — POCT URINALYSIS DIP (DEVICE)
BILIRUBIN URINE: NEGATIVE
Glucose, UA: NEGATIVE mg/dL
KETONES UR: NEGATIVE mg/dL
NITRITE: NEGATIVE
Specific Gravity, Urine: 1.02 (ref 1.005–1.030)
Urobilinogen, UA: 0.2 mg/dL (ref 0.0–1.0)
pH: 8.5 — ABNORMAL HIGH (ref 5.0–8.0)

## 2016-03-20 LAB — POCT I-STAT, CHEM 8
BUN: 7 mg/dL (ref 6–20)
CALCIUM ION: 1.18 mmol/L (ref 1.15–1.40)
CHLORIDE: 102 mmol/L (ref 101–111)
CREATININE: 0.9 mg/dL (ref 0.44–1.00)
GLUCOSE: 79 mg/dL (ref 65–99)
HCT: 40 % (ref 36.0–46.0)
Hemoglobin: 13.6 g/dL (ref 12.0–15.0)
POTASSIUM: 3.9 mmol/L (ref 3.5–5.1)
Sodium: 141 mmol/L (ref 135–145)
TCO2: 28 mmol/L (ref 0–100)

## 2016-03-20 LAB — POCT PREGNANCY, URINE: Preg Test, Ur: NEGATIVE

## 2016-03-20 MED ORDER — FLUCONAZOLE 150 MG PO TABS: 150.0000 mg | ORAL_TABLET | Freq: Every day | ORAL | 0 refills | Status: DC

## 2016-03-20 NOTE — ED Provider Notes (Signed)
[facility]    CSN: [id number] Arrival date & time: 03/20/16  7829     History   Chief Complaint Chief Complaint  Patient presents with  . Dysuria  . Abdominal Pain    HPI Erika Townsend is a 26 y.o. female.   Pt c/o dysuria and abdominal pain.  The latter occurs intermittently and is chronic. It is mostly left sided. Denies fever, discharge, N/V/D. Dysuria x1 day. She admits to sexual intercourse yesterday.  PMH sig for PCOS, h/o kidney stones, IUD and abdominal cerclage.      Past Medical History:  Diagnosis Date  . Abnormal glucose tolerance in pregnancy 03/23/2014  . Asherman syndrome   . Cervical cerclage suture present in third trimester 04/19/2014  . Diabetes mellitus without complication (HCC)    pt states has problems with BS, but not diabetic  . Endometritis following delivery 12/03/2012  . Gestational diabetes   . Incompetence of cervix   . PCOS (polycystic ovarian syndrome)   . S/P cesarean section 04/21/2014  . Traumatic injury during pregnancy in third trimester 03/23/2014    Patient Active Problem List   Diagnosis Date Noted  . S/P cesarean section 04/21/2014  . Prior poor obstetrical history in third trimester, antepartum 04/19/2014  . Cervical cerclage suture present in third trimester 04/19/2014  . Traumatic injury during pregnancy in third trimester 03/23/2014  . Abnormal glucose tolerance in pregnancy 03/23/2014  . Traumatic injury during pregnancy 03/23/2014  . History of incompetent cervix, currently pregnant in second trimester   . Low lying placenta without hemorrhage, antepartum   . Preterm contractions   . [redacted] weeks gestation of pregnancy   . Chorioamnionitis 12/05/2012  . S/P dilatation and curettage 12/03/2012  . Endometritis following delivery 12/03/2012  . SVD (spontaneous vaginal delivery) 12/02/2012  . Retained placenta parts or membranes, postpartum 12/02/2012  . S/P dilation and curettage 12/02/2012  . Incompetent  cervix 11/10/2012  . McDonald cerclage present 11/10/2012    Past Surgical History:  Procedure Laterality Date  . CERVICAL CERCLAGE N/A 11/10/2012   Procedure: CERCLAGE CERVICAL;  Surgeon: Oliver Pila, MD;  Location: [facility];  Service: Gynecology;  Laterality: N/A;  . CERVICAL CERCLAGE N/A 12/01/2012   Procedure: CERCLAGE CERVICAL REMOVAL;  Surgeon: Sherron Monday, MD;  Location: [facility];  Service: Gynecology;  Laterality: N/A;  . CESAREAN SECTION N/A 04/21/2014   Procedure: CESAREAN SECTION;  Surgeon: Sherian Rein, MD;  Location: [facility];  Service: Obstetrics;  Laterality: N/A;  MD requests RNFA [facility] confirmed  . DILATION AND CURETTAGE OF UTERUS  01/26/2012   following a miscarriage  . DILATION AND EVACUATION N/A 12/01/2012   Procedure: DILATATION AND EVACUATION;  Surgeon: Sherron Monday, MD;  Location: [facility];  Service: Gynecology;  Laterality: N/A;  . DILATION AND EVACUATION N/A 12/03/2012   Procedure: DILATATION AND EVACUATION;  Surgeon: Sherron Monday, MD;  Location: [facility];  Service: Gynecology;  Laterality: N/A;  . HYSTEROSCOPY W/D&C N/A 02/06/2013   Procedure:  SUCTION D&C HYSTEROSCOPY/LYSIS OF ADHESIONS/ INTRAOP HSG COOK UTERINE STENT PLACEMENT;  Surgeon: Fermin Schwab, MD;  Location: [facility];  Service: Gynecology;  Laterality: N/A;    OB History    Gravida Para Term Preterm AB Living   3 1 1   2 1    SAB TAB Ectopic Multiple Live Births   2     0 1       Home Medications    Prior to Admission medications   Medication  Sig Start Date End Date Taking? Authorizing Provider  ibuprofen (ADVIL,MOTRIN) 600 MG tablet Take 1 tablet (600 mg total) by mouth every 6 (six) hours as needed for moderate pain. 01/15/15  Yes Earley Favor, NP  acetaminophen (TYLENOL) 500 MG tablet Take 1,000 mg by mouth every 6 (six) hours as needed for mild pain, moderate pain or headache.    Historical Provider, MD  fluconazole (DIFLUCAN) 150 MG tablet Take 1 tablet (150 mg  total) by mouth daily. 03/20/16 03/22/16  Arnaldo Natal, MD    Family History Family History  Problem Relation Age of Onset  . Stroke Maternal Grandmother   . Heart disease Maternal Grandmother   . Cancer Maternal Grandfather     Social History Social History  Substance Use Topics  . Smoking status: Never Smoker  . Smokeless tobacco: Never Used  . Alcohol use No     Allergies   Cinnamon; Penicillins; Diphenhydramine; Flagyl [metronidazole]; and Tape   Review of Systems Review of Systems  Constitutional: Negative for chills and fever.  HENT: Negative for sore throat and tinnitus.   Eyes: Negative for redness.  Respiratory: Negative for cough and shortness of breath.   Cardiovascular: Negative for chest pain and palpitations.  Gastrointestinal: Positive for abdominal pain. Negative for diarrhea, nausea and vomiting.  Genitourinary: Positive for dysuria. Negative for dyspareunia, frequency, hematuria, urgency and vaginal discharge.  Musculoskeletal: Negative for myalgias.  Skin: Negative for rash.       No lesions  Neurological: Negative for weakness.  Hematological: Does not bruise/bleed easily.  Psychiatric/Behavioral: Negative for suicidal ideas.     Physical Exam Triage Vital Signs ED Triage Vitals [03/20/16 1921]  Enc Vitals Group     BP 129/81     Pulse Rate 82     Resp 20     Temp 98.3 F (36.8 C)     Temp Source Oral     SpO2 97 %     Weight      Height      Head Circumference      Peak Flow      Pain Score 0     Pain Loc      Pain Edu?      Excl. in GC?    No data found.   Updated Vital Signs BP 129/81 (BP Location: Right Arm)   Pulse 82   Temp 98.3 F (36.8 C) (Oral)   Resp 20   SpO2 97%   Visual Acuity Right Eye Distance:   Left Eye Distance:   Bilateral Distance:    Right Eye Near:   Left Eye Near:    Bilateral Near:     Physical Exam  Constitutional: She is oriented to person, place, and time. She appears well-developed  and well-nourished. No distress.  HENT:  Head: Normocephalic and atraumatic.  Mouth/Throat: Oropharynx is clear and moist.  Eyes: Conjunctivae and EOM are normal. Pupils are equal, round, and reactive to light. No scleral icterus.  Neck: Normal range of motion. Neck supple. No JVD present. No tracheal deviation present. No thyromegaly present.  Cardiovascular: Normal rate, regular rhythm and normal heart sounds.  Exam reveals no gallop and no friction rub.   No murmur heard. Pulmonary/Chest: Effort normal and breath sounds normal.  Abdominal: Soft. Bowel sounds are normal. She exhibits no distension and no mass. There is no tenderness. There is no rebound and no guarding.  Musculoskeletal: Normal range of motion. She exhibits no edema.  Lymphadenopathy:  She has no cervical adenopathy.  Neurological: She is alert and oriented to person, place, and time. No cranial nerve deficit.  Skin: Skin is warm and dry.  Psychiatric: She has a normal mood and affect. Her behavior is normal. Judgment and thought content normal.  Nursing note and vitals reviewed.    UC Treatments / Results  Labs (all labs ordered are listed, but only abnormal results are displayed) Labs Reviewed  POCT URINALYSIS DIP (DEVICE) - Abnormal; Notable for the following:       Result Value   Hgb urine dipstick MODERATE (*)    pH 8.5 (*)    Protein, ur >=300 (*)    Leukocytes, UA LARGE (*)    All other components within normal limits  POCT PREGNANCY, URINE  POCT I-STAT, CHEM 8    EKG  EKG Interpretation None       Radiology No results found.  Procedures Procedures (including critical care time)  Medications Ordered in UC Medications - No data to display   Initial Impression / Assessment and Plan / UC Course  I have reviewed the triage vital signs and the nursing notes.  Pertinent labs & imaging results that were available during my care of the patient were reviewed by me and considered in my medical  decision making (see chart for details).     Large ddx of left side abdominal pain which includes ovarian cyst pain, kidney stone, muscle strain, UTI.  The latter is unlikely given negative nitrites and minimal WBC. High protein and Hb present, however.  Likely secondary to microscopic hematuria.  She is not on her period. Kidney function is normal.  Thus blood may be secondary to PCOS and/or movement of IUD or minor trauma to cerclage secondary to intercourse. Dysuria may be begin of yeast infection which we will treat.   Final Clinical Impressions(s) / UC Diagnoses   Final diagnoses:  Vaginal candidiasis  Benign essential microscopic hematuria    New Prescriptions Discharge Medication List as of 03/20/2016  8:14 PM    START taking these medications   Details  fluconazole (DIFLUCAN) 150 MG tablet Take 1 tablet (150 mg total) by mouth daily., Starting Sun 03/20/2016, Until Tue 03/22/2016, Print         Arnaldo NatalMichael S Estellar Cadena, MD 03/20/16 2041

## 2016-03-20 NOTE — ED Triage Notes (Addendum)
[facility] with a complaint of lower abdominal pain, lower back pain and dysuria that started after sexual intercourse last night.

## 2016-03-21 ENCOUNTER — Encounter (HOSPITAL_COMMUNITY): Payer: Self-pay

## 2016-03-21 ENCOUNTER — Inpatient Hospital Stay (HOSPITAL_COMMUNITY)
Admission: AD | Admit: 2016-03-21 | Discharge: 2016-03-21 | Disposition: A | Discharge status: Home / Self Care | Payer: Medicaid Other | Source: Ambulatory Visit | Attending: Obstetrics and Gynecology | Admitting: Obstetrics and Gynecology

## 2016-03-21 DIAGNOSIS — R1030 Lower abdominal pain, unspecified: Secondary | ICD-10-CM | POA: Diagnosis not present

## 2016-03-21 DIAGNOSIS — N390 Urinary tract infection, site not specified: Secondary | ICD-10-CM | POA: Diagnosis not present

## 2016-03-21 DIAGNOSIS — Z9889 Other specified postprocedural states: Secondary | ICD-10-CM | POA: Insufficient documentation

## 2016-03-21 DIAGNOSIS — Z888 Allergy status to other drugs, medicaments and biological substances status: Secondary | ICD-10-CM | POA: Insufficient documentation

## 2016-03-21 DIAGNOSIS — E282 Polycystic ovarian syndrome: Secondary | ICD-10-CM | POA: Diagnosis not present

## 2016-03-21 DIAGNOSIS — N856 Intrauterine synechiae: Secondary | ICD-10-CM | POA: Diagnosis not present

## 2016-03-21 DIAGNOSIS — Z880 Allergy status to penicillin: Secondary | ICD-10-CM | POA: Diagnosis not present

## 2016-03-21 DIAGNOSIS — Z823 Family history of stroke: Secondary | ICD-10-CM | POA: Diagnosis not present

## 2016-03-21 DIAGNOSIS — Z809 Family history of malignant neoplasm, unspecified: Secondary | ICD-10-CM | POA: Diagnosis not present

## 2016-03-21 DIAGNOSIS — R300 Dysuria: Secondary | ICD-10-CM | POA: Diagnosis not present

## 2016-03-21 DIAGNOSIS — Z79899 Other long term (current) drug therapy: Secondary | ICD-10-CM | POA: Insufficient documentation

## 2016-03-21 DIAGNOSIS — E119 Type 2 diabetes mellitus without complications: Secondary | ICD-10-CM | POA: Diagnosis not present

## 2016-03-21 DIAGNOSIS — Z8249 Family history of ischemic heart disease and other diseases of the circulatory system: Secondary | ICD-10-CM | POA: Insufficient documentation

## 2016-03-21 LAB — URINALYSIS, MICROSCOPIC (REFLEX)

## 2016-03-21 LAB — URINALYSIS, ROUTINE W REFLEX MICROSCOPIC
BILIRUBIN URINE: NEGATIVE
Glucose, UA: NEGATIVE mg/dL
Ketones, ur: NEGATIVE mg/dL
Nitrite: POSITIVE — AB
PROTEIN: 30 mg/dL — AB
Specific Gravity, Urine: 1.02 (ref 1.005–1.030)
pH: 7 (ref 5.0–8.0)

## 2016-03-21 LAB — WET PREP, GENITAL
Clue Cells Wet Prep HPF POC: NONE SEEN
Sperm: NONE SEEN
Trich, Wet Prep: NONE SEEN
YEAST WET PREP: NONE SEEN

## 2016-03-21 MED ORDER — PHENAZOPYRIDINE HCL 100 MG PO TABS
200.0000 mg | ORAL_TABLET | Freq: Once | ORAL | Status: AC
  Administered 2016-03-21: 200 mg via ORAL
  Filled 2016-03-21: qty 2

## 2016-03-21 MED ORDER — PHENAZOPYRIDINE HCL 200 MG PO TABS
200.0000 mg | ORAL_TABLET | Freq: Three times a day (TID) | ORAL | 0 refills | Status: DC
Start: 2016-03-21 — End: 2016-05-24

## 2016-03-21 MED ORDER — SULFAMETHOXAZOLE-TRIMETHOPRIM 800-160 MG PO TABS: 1.0000 | ORAL_TABLET | Freq: Two times a day (BID) | ORAL | 0 refills | Status: AC

## 2016-03-21 MED ORDER — SULFAMETHOXAZOLE-TRIMETHOPRIM 800-160 MG PO TABS: 1.0000 | ORAL_TABLET | Freq: Two times a day (BID) | ORAL | 0 refills | Status: DC

## 2016-03-21 MED ORDER — PHENAZOPYRIDINE HCL 200 MG PO TABS: 200.0000 mg | ORAL_TABLET | Freq: Three times a day (TID) | ORAL | 0 refills | Status: DC

## 2016-03-21 NOTE — Discharge Instructions (Signed)

## 2016-03-21 NOTE — MAU Note (Signed)
Pt c/o abdominal pain that started after intercourse on Saturday night. Pt states the pain is worse when she urinates. Pt states she was seen at urgent care yesterday for the pain. Pt states she was told she has a yeast infection and given prescription for yeast infection medicine. Pt states she has taken one dose of that medicine. Pt states the pain has gotten worse. Pt states the pain feels the same as when she has had a urinary tract infection before.

## 2016-03-21 NOTE — MAU Provider Note (Signed)
History     CSN: [id number]  Arrival date and time: 03/21/16 1129   First Provider Initiated Contact with Patient 03/21/16 1233      Chief Complaint  Patient presents with  . Abdominal Pain   HPI   [name] is  26 y.o. female [phone] here in MAU with dysuria and lower abdominal pain. She was seen at the urgent care yesterday because she was certain she had a UTI. She was told that her urine was ok and they thought she may have a UTI. Her dysuria worsened throughout the night so she returned here.   OB History    Gravida Para Term Preterm AB Living   3 1 1   2 1    SAB TAB Ectopic Multiple Live Births   2     0 1      Past Medical History:  Diagnosis Date  . Abnormal glucose tolerance in pregnancy 03/23/2014  . Asherman syndrome   . Cervical cerclage suture present in third trimester 04/19/2014  . Diabetes mellitus without complication (HCC)    pt states has problems with BS, but not diabetic  . Endometritis following delivery 12/03/2012  . Gestational diabetes   . Incompetence of cervix   . PCOS (polycystic ovarian syndrome)   . S/P cesarean section 04/21/2014  . Traumatic injury during pregnancy in third trimester 03/23/2014    Past Surgical History:  Procedure Laterality Date  . CERVICAL CERCLAGE N/A 11/10/2012   Procedure: CERCLAGE CERVICAL;  Surgeon: Oliver Pila, MD;  Location: [facility];  Service: Gynecology;  Laterality: N/A;  . CERVICAL CERCLAGE N/A 12/01/2012   Procedure: CERCLAGE CERVICAL REMOVAL;  Surgeon: Sherron Monday, MD;  Location: [facility];  Service: Gynecology;  Laterality: N/A;  . CESAREAN SECTION N/A 04/21/2014   Procedure: CESAREAN SECTION;  Surgeon: Sherian Rein, MD;  Location: [facility];  Service: Obstetrics;  Laterality: N/A;  MD requests RNFA [facility] confirmed  . DILATION AND CURETTAGE OF UTERUS  01/26/2012   following a miscarriage  . DILATION AND EVACUATION N/A 12/01/2012   Procedure: DILATATION AND EVACUATION;  Surgeon: Sherron Monday, MD;  Location: [facility];  Service: Gynecology;  Laterality: N/A;  . DILATION AND EVACUATION N/A 12/03/2012   Procedure: DILATATION AND EVACUATION;  Surgeon: Sherron Monday, MD;  Location: [facility];  Service: Gynecology;  Laterality: N/A;  . HYSTEROSCOPY W/D&C N/A 02/06/2013   Procedure:  SUCTION D&C HYSTEROSCOPY/LYSIS OF ADHESIONS/ INTRAOP HSG COOK UTERINE STENT PLACEMENT;  Surgeon: Fermin Schwab, MD;  Location: [facility];  Service: Gynecology;  Laterality: N/A;    Family History  Problem Relation Age of Onset  . Stroke Maternal Grandmother   . Heart disease Maternal Grandmother   . Cancer Maternal Grandfather     Social History  Substance Use Topics  . Smoking status: Never Smoker  . Smokeless tobacco: Never Used  . Alcohol use No    Allergies:  Allergies  Allergen Reactions  . Cinnamon Shortness Of Breath  . Penicillins Shortness Of Breath and Swelling    Has patient had a PCN reaction causing immediate rash, facial/tongue/throat swelling, SOB or lightheadedness with hypotension: Yes Has patient had a PCN reaction causing severe rash involving mucus membranes or skin necrosis: No Has patient had a PCN reaction that required hospitalization: Yes Has patient had a PCN reaction occurring within the last 10 years: No If all of the above answers are "NO", then may proceed with Cephalosporin use.   . Diphenhydramine Hives  .  Flagyl [Metronidazole] Itching and Other (See Comments)    Reaction:  Bumps in the mouth   . Tape Rash    Prescriptions Prior to Admission  Medication Sig Dispense Refill Last Dose  . acetaminophen (TYLENOL) 500 MG tablet Take 1,000 mg by mouth every 6 (six) hours as needed for mild pain, moderate pain or headache.   12/05/2015 at Unknown time  . fluconazole (DIFLUCAN) 150 MG tablet Take 1 tablet (150 mg total) by mouth daily. 2 tablet 0   . ibuprofen (ADVIL,MOTRIN) 600 MG tablet Take 1 tablet (600 mg total) by mouth every 6 (six) hours  as needed for moderate pain. 30 tablet 0 03/20/2016 at 4pm   Results for orders placed or performed during the hospital encounter of 03/21/16 (from the past 48 hour(s))  Urinalysis, Routine w reflex microscopic     Status: Abnormal   Collection Time: 03/21/16 11:35 AM  Result Value Ref Range   Color, Urine YELLOW YELLOW   APPearance CLOUDY (A) CLEAR   Specific Gravity, Urine 1.020 1.005 - 1.030   pH 7.0 5.0 - 8.0   Glucose, UA NEGATIVE NEGATIVE mg/dL   Hgb urine dipstick LARGE (A) NEGATIVE   Bilirubin Urine NEGATIVE NEGATIVE   Ketones, ur NEGATIVE NEGATIVE mg/dL   Protein, ur 30 (A) NEGATIVE mg/dL   Nitrite POSITIVE (A) NEGATIVE   Leukocytes, UA LARGE (A) NEGATIVE  Urinalysis, Microscopic (reflex)     Status: Abnormal   Collection Time: 03/21/16 11:35 AM  Result Value Ref Range   RBC / HPF 0-5 0 - 5 RBC/hpf   WBC, UA 6-30 0 - 5 WBC/hpf   Bacteria, UA FEW (A) NONE SEEN   Squamous Epithelial / LPF 0-5 (A) NONE SEEN  Wet prep, genital     Status: Abnormal   Collection Time: 03/21/16 12:00 PM  Result Value Ref Range   Yeast Wet Prep HPF POC NONE SEEN NONE SEEN   Trich, Wet Prep NONE SEEN NONE SEEN   Clue Cells Wet Prep HPF POC NONE SEEN NONE SEEN   WBC, Wet Prep HPF POC FEW (A) NONE SEEN    Comment: FEW BACTERIA SEEN   Sperm NONE SEEN     Review of Systems  Genitourinary: Positive for dysuria and urgency. Negative for flank pain.   Physical Exam   Blood pressure 126/86, pulse 80, temperature 99 F (37.2 C), temperature source Oral, resp. rate 18, height 5\' 4"  (1.626 m), weight 263 lb 4 oz (119.4 kg), SpO2 100 %, unknown if currently breastfeeding.  Physical Exam  Constitutional: She is oriented to person, place, and time. She appears well-developed and well-nourished. No distress.  HENT:  Head: Normocephalic.  Eyes: Pupils are equal, round, and reactive to light.  GI: Soft. Normal appearance. There is tenderness in the suprapubic area. There is no rigidity, no rebound and  no guarding.  Genitourinary:  Genitourinary Comments: Wet prep and GC collected by RN without speculum.   Musculoskeletal: Normal range of motion.  Neurological: She is alert and oriented to person, place, and time.  Skin: Skin is warm. She is not diaphoretic.  Psychiatric: Her behavior is normal.    MAU Course  Procedures  None  MDM  UA Wet prep & GC   Assessment and Plan   A:  1. Acute UTI     P:  Discharge home in stable condition Rx: Bactrim, pyridium Return to MAU for emergencies only  Duane LopeJennifer I Betsey Sossamon, NP 03/21/2016 1:18 PM

## 2016-03-22 LAB — GC/CHLAMYDIA PROBE AMP (~~LOC~~) NOT AT ARMC
CHLAMYDIA, DNA PROBE: NEGATIVE
Neisseria Gonorrhea: NEGATIVE

## 2016-05-24 ENCOUNTER — Inpatient Hospital Stay (HOSPITAL_COMMUNITY)
Admission: AD | Admit: 2016-05-24 | Discharge: 2016-05-24 | Disposition: A | Discharge status: Home / Self Care | Payer: Medicaid Other | Source: Ambulatory Visit | Attending: Obstetrics and Gynecology | Admitting: Obstetrics and Gynecology

## 2016-05-24 ENCOUNTER — Encounter (HOSPITAL_COMMUNITY): Payer: Self-pay

## 2016-05-24 DIAGNOSIS — R51 Headache: Secondary | ICD-10-CM | POA: Diagnosis not present

## 2016-05-24 DIAGNOSIS — N760 Acute vaginitis: Secondary | ICD-10-CM | POA: Diagnosis not present

## 2016-05-24 DIAGNOSIS — Z880 Allergy status to penicillin: Secondary | ICD-10-CM | POA: Insufficient documentation

## 2016-05-24 DIAGNOSIS — B9689 Other specified bacterial agents as the cause of diseases classified elsewhere: Secondary | ICD-10-CM | POA: Insufficient documentation

## 2016-05-24 DIAGNOSIS — E119 Type 2 diabetes mellitus without complications: Secondary | ICD-10-CM | POA: Diagnosis not present

## 2016-05-24 DIAGNOSIS — R102 Pelvic and perineal pain: Secondary | ICD-10-CM | POA: Diagnosis not present

## 2016-05-24 DIAGNOSIS — G43909 Migraine, unspecified, not intractable, without status migrainosus: Secondary | ICD-10-CM | POA: Insufficient documentation

## 2016-05-24 DIAGNOSIS — R109 Unspecified abdominal pain: Secondary | ICD-10-CM | POA: Diagnosis present

## 2016-05-24 DIAGNOSIS — G44209 Tension-type headache, unspecified, not intractable: Secondary | ICD-10-CM

## 2016-05-24 LAB — WET PREP, GENITAL
SPERM: NONE SEEN
TRICH WET PREP: NONE SEEN
YEAST WET PREP: NONE SEEN

## 2016-05-24 LAB — URINALYSIS, ROUTINE W REFLEX MICROSCOPIC
Bilirubin Urine: NEGATIVE
Glucose, UA: NEGATIVE mg/dL
Hgb urine dipstick: NEGATIVE
Ketones, ur: NEGATIVE mg/dL
Leukocytes, UA: NEGATIVE
NITRITE: NEGATIVE
PH: 7 (ref 5.0–8.0)
Protein, ur: NEGATIVE mg/dL
SPECIFIC GRAVITY, URINE: 1.021 (ref 1.005–1.030)

## 2016-05-24 LAB — POCT PREGNANCY, URINE: Preg Test, Ur: NEGATIVE

## 2016-05-24 MED ORDER — CLINDAMYCIN HCL 300 MG PO CAPS: 300.0000 mg | ORAL_CAPSULE | Freq: Two times a day (BID) | ORAL | 0 refills | Status: DC

## 2016-05-24 MED ORDER — BUTALBITAL-APAP-CAFFEINE 50-325-40 MG PO TABS
2.0000 | ORAL_TABLET | Freq: Once | ORAL | Status: AC
  Administered 2016-05-24: 2 via ORAL
  Filled 2016-05-24: qty 2

## 2016-05-24 MED ORDER — BUTALBITAL-APAP-CAFFEINE 50-325-40 MG PO TABS: 1.0000 | ORAL_TABLET | Freq: Four times a day (QID) | ORAL | 0 refills | Status: AC | PRN

## 2016-05-24 NOTE — Discharge Instructions (Signed)
Tension Headache A tension headache is pain, pressure, or aching that is felt over the front and sides of your head. These headaches can last from 30 minutes to several days. Follow these instructions at home: Managing pain  Take over-the-counter and prescription medicines only as told by your doctor.  Lie down in a dark, quiet room when you have a headache.  If directed, apply ice to your head and neck area:  Put ice in a plastic bag.  Place a towel between your skin and the bag.  Leave the ice on for 20 minutes, 2-3 times per day.  Use a heating pad or a hot shower to apply heat to your head and neck area as told by your doctor. Eating and drinking  Eat meals on a regular schedule.  Do not drink a lot of alcohol.  Do not use a lot of caffeine, or stop using caffeine. General instructions  Keep all follow-up visits as told by your doctor. This is important.  Keep a journal to find out if certain things bring on headaches. For example, write down:  What you eat and drink.  How much sleep you get.  Any change to your diet or medicines.  Try getting a massage, or doing other things that help you to relax.  Lessen stress.  Sit up straight. Do not tighten (tense) your muscles.  Do not use tobacco products. This includes cigarettes, chewing tobacco, or e-cigarettes. If you need help quitting, ask your doctor.  Exercise regularly as told by your doctor.  Get enough sleep. This may mean 7-9 hours of sleep. Contact a doctor if:  Your symptoms are not helped by medicine.  You have a headache that feels different from your usual headache.  You feel sick to your stomach (nauseous) or you throw up (vomit).  You have a fever. Get help right away if:  Your headache becomes very bad.  You keep throwing up.  You have a stiff neck.  You have trouble seeing.  You have trouble speaking.  You have pain in your eye or ear.  Your muscles are weak or you lose muscle  control.  You lose your balance or you have trouble walking.  You feel like you will pass out (faint) or you pass out.  You have confusion. This information is not intended to replace advice given to you by your health care provider. Make sure you discuss any questions you have with your health care provider. Document Released: 03/23/2009 Document Revised: 08/27/2015 Document Reviewed: 04/21/2014 Elsevier Interactive Patient Education  2017 Elsevier Inc.  

## 2016-05-24 NOTE — MAU Provider Note (Signed)
History     CSN: [id number]  Arrival date and time: 05/24/16 1100   First Provider Initiated Contact with Patient 05/24/16 1250      Chief Complaint  Patient presents with  . Abdominal Pain  . Emesis  . Headache   HPI Ms. Erika Townsend is a 26 y.o. [id number] who presents to MAU today with complaint of headache since Friday. The patient is also concerned about pelvic pain. She thinks it is caused by her IUD. She states pain since Friday as well, but IUD was placed > 1 year ago. She has tried Tylenol and Ibuprofen without relief. She denies any history of migraines or HTN. She does endorse photophobia with headache.  OB History    Gravida Para Term Preterm AB Living   3 1 1   2 1    SAB TAB Ectopic Multiple Live Births   2     0 1      Past Medical History:  Diagnosis Date  . Abnormal glucose tolerance in pregnancy 03/23/2014  . Asherman syndrome   . Cervical cerclage suture present in third trimester 04/19/2014  . Diabetes mellitus without complication (HCC)    pt states has problems with BS, but not diabetic  . Endometritis following delivery 12/03/2012  . Gestational diabetes   . Incompetence of cervix   . PCOS (polycystic ovarian syndrome)   . S/P cesarean section 04/21/2014  . Traumatic injury during pregnancy in third trimester 03/23/2014    Past Surgical History:  Procedure Laterality Date  . CERVICAL CERCLAGE N/A 11/10/2012   Procedure: CERCLAGE CERVICAL;  Surgeon: Oliver PilaKathy W Richardson, MD;  Location: [facility];  Service: Gynecology;  Laterality: N/A;  . CERVICAL CERCLAGE N/A 12/01/2012   Procedure: CERCLAGE CERVICAL REMOVAL;  Surgeon: Sherron MondayJody [name], MD;  Location: [facility];  Service: Gynecology;  Laterality: N/A;  . CESAREAN SECTION N/A 04/21/2014   Procedure: CESAREAN SECTION;  Surgeon: Sherian ReinJody [name]-Stuckert, MD;  Location: [facility];  Service: Obstetrics;  Laterality: N/A;  [facility]  . DILATION AND CURETTAGE OF UTERUS  01/26/2012   following a  miscarriage  . DILATION AND EVACUATION N/A 12/01/2012   Procedure: DILATATION AND EVACUATION;  Surgeon: Sherron MondayJody [name], MD;  Location: [facility];  Service: Gynecology;  Laterality: N/A;  . DILATION AND EVACUATION N/A 12/03/2012   Procedure: DILATATION AND EVACUATION;  Surgeon: Sherron MondayJody [name], MD;  Location: [facility];  Service: Gynecology;  Laterality: N/A;  . HYSTEROSCOPY W/D&C N/A 02/06/2013   Procedure:  SUCTION D&C HYSTEROSCOPY/LYSIS OF ADHESIONS/ INTRAOP HSG COOK UTERINE STENT PLACEMENT;  Surgeon: Fermin Schwabamer Yalcinkaya, MD;  Location: [facility];  Service: Gynecology;  Laterality: N/A;    Family History  Problem Relation Age of Onset  . Stroke Maternal Grandmother   . Heart disease Maternal Grandmother   . Cancer Maternal Grandfather     Social History  Substance Use Topics  . Smoking status: Never Smoker  . Smokeless tobacco: Never Used  . Alcohol use No    Allergies:  Allergies  Allergen Reactions  . Cinnamon Shortness Of Breath  . Penicillins Shortness Of Breath and Swelling    Has patient had a PCN reaction causing immediate rash, facial/tongue/throat swelling, SOB or lightheadedness with hypotension: Yes Has patient had a PCN reaction causing severe rash involving mucus membranes or skin necrosis: No Has patient had a PCN reaction that required hospitalization: Yes Has patient had a PCN reaction occurring within the last 10 years: No If all of the above  answers are "NO", then may proceed with Cephalosporin use.   . Diphenhydramine Hives  . Flagyl [Metronidazole] Itching and Other (See Comments)    Reaction:  Bumps in the mouth   . Tape Rash    No prescriptions prior to admission.    Review of Systems  Constitutional: Positive for fever.  Gastrointestinal: Positive for abdominal pain, nausea and vomiting. Negative for constipation and diarrhea.  Genitourinary: Negative for dysuria, frequency, urgency, vaginal bleeding and vaginal discharge.   Physical Exam    Blood pressure 131/89, pulse 68, temperature 97.6 F (36.4 C), resp. rate 18, height 5\' 6"  (1.676 m), weight 262 lb (118.8 kg), SpO2 100 %, unknown if currently breastfeeding.  Physical Exam  Nursing note and vitals reviewed. Constitutional: She is oriented to person, place, and time. She appears well-developed and well-nourished. No distress.  HENT:  Head: Normocephalic and atraumatic.  Cardiovascular: Normal rate.   Respiratory: Effort normal.  GI: Soft. Bowel sounds are normal. She exhibits no distension and no mass. There is tenderness (mild tenderness to palpation of the mid abdomen on the left side). There is no rebound and no guarding.  Neurological: She is alert and oriented to person, place, and time.  Skin: Skin is warm and dry. No erythema.  Psychiatric: She has a normal mood and affect.    Results for orders placed or performed during the hospital encounter of 05/24/16 (from the past 24 hour(s))  Urinalysis, Routine w reflex microscopic     Status: None   Collection Time: 05/24/16 11:40 AM  Result Value Ref Range   Color, Urine YELLOW YELLOW   APPearance CLEAR CLEAR   Specific Gravity, Urine 1.021 1.005 - 1.030   pH 7.0 5.0 - 8.0   Glucose, UA NEGATIVE NEGATIVE mg/dL   Hgb urine dipstick NEGATIVE NEGATIVE   Bilirubin Urine NEGATIVE NEGATIVE   Ketones, ur NEGATIVE NEGATIVE mg/dL   Protein, ur NEGATIVE NEGATIVE mg/dL   Nitrite NEGATIVE NEGATIVE   Leukocytes, UA NEGATIVE NEGATIVE  Pregnancy, urine POC     Status: None   Collection Time: 05/24/16 11:47 AM  Result Value Ref Range   Preg Test, Ur NEGATIVE NEGATIVE  Wet prep, genital     Status: Abnormal   Collection Time: 05/24/16  1:30 PM  Result Value Ref Range   Yeast Wet Prep HPF POC NONE SEEN NONE SEEN   Trich, Wet Prep NONE SEEN NONE SEEN   Clue Cells Wet Prep HPF POC PRESENT (A) NONE SEEN   WBC, Wet Prep HPF POC FEW (A) NONE SEEN   Sperm NONE SEEN     MAU Course  Procedures None  MDM UPT -  negative UA today  Patient requests STD testing. Wet prep and GC/Chlamydia collected by RN.  Fioricet given for headache. Patient states resolution of pain.   Assessment and Plan  A: Migraine headache Bacterial vaginosis  P: Discharge home Rx for Fioricet and Flagyl given to patient  Patient advised to follow-up with PCP of choice for routine care and GYN of choice to discuss change in birth control if desired Patient may return to MAU as needed or if her condition were to change or worsen  Vonzella Nipple, PA-C 05/24/2016, 6:00 PM

## 2016-05-24 NOTE — MAU Note (Signed)
Pt presents to MAU with complaints of headache, vomiting and lower abdominal cramping since Friday night. Pt denies any vaginal bleeding, states she does not have a cycle since she has an IUD

## 2016-05-27 LAB — GC/CHLAMYDIA PROBE AMP (~~LOC~~) NOT AT ARMC
CHLAMYDIA, DNA PROBE: NEGATIVE
Neisseria Gonorrhea: NEGATIVE

## 2016-07-05 ENCOUNTER — Inpatient Hospital Stay (HOSPITAL_COMMUNITY)
Admission: AD | Admit: 2016-07-05 | Discharge: 2016-07-05 | Disposition: A | Discharge status: Home / Self Care | Payer: Medicaid Other | Source: Ambulatory Visit | Attending: Family Medicine | Admitting: Family Medicine

## 2016-07-05 DIAGNOSIS — Z880 Allergy status to penicillin: Secondary | ICD-10-CM | POA: Insufficient documentation

## 2016-07-05 DIAGNOSIS — E119 Type 2 diabetes mellitus without complications: Secondary | ICD-10-CM | POA: Insufficient documentation

## 2016-07-05 DIAGNOSIS — N898 Other specified noninflammatory disorders of vagina: Secondary | ICD-10-CM | POA: Insufficient documentation

## 2016-07-05 DIAGNOSIS — E282 Polycystic ovarian syndrome: Secondary | ICD-10-CM | POA: Insufficient documentation

## 2016-07-05 DIAGNOSIS — N760 Acute vaginitis: Secondary | ICD-10-CM | POA: Insufficient documentation

## 2016-07-05 DIAGNOSIS — B9689 Other specified bacterial agents as the cause of diseases classified elsewhere: Secondary | ICD-10-CM | POA: Diagnosis not present

## 2016-07-05 DIAGNOSIS — R112 Nausea with vomiting, unspecified: Secondary | ICD-10-CM | POA: Insufficient documentation

## 2016-07-05 DIAGNOSIS — Z975 Presence of (intrauterine) contraceptive device: Secondary | ICD-10-CM | POA: Diagnosis not present

## 2016-07-05 LAB — URINALYSIS, ROUTINE W REFLEX MICROSCOPIC
Bilirubin Urine: NEGATIVE
GLUCOSE, UA: NEGATIVE mg/dL
Hgb urine dipstick: NEGATIVE
KETONES UR: NEGATIVE mg/dL
LEUKOCYTES UA: NEGATIVE
Nitrite: NEGATIVE
PROTEIN: NEGATIVE mg/dL
Specific Gravity, Urine: 1.025 (ref 1.005–1.030)
pH: 5 (ref 5.0–8.0)

## 2016-07-05 LAB — WET PREP, GENITAL
Sperm: NONE SEEN
TRICH WET PREP: NONE SEEN
Yeast Wet Prep HPF POC: NONE SEEN

## 2016-07-05 LAB — POCT PREGNANCY, URINE: Preg Test, Ur: NEGATIVE

## 2016-07-05 MED ORDER — CLINDAMYCIN HCL 300 MG PO CAPS: 300.0000 mg | ORAL_CAPSULE | Freq: Two times a day (BID) | ORAL | 0 refills | Status: DC

## 2016-07-05 MED ORDER — ONDANSETRON HCL 4 MG PO TABS: 4.0000 mg | ORAL_TABLET | Freq: Four times a day (QID) | ORAL | 0 refills | Status: DC

## 2016-07-05 MED ORDER — ONDANSETRON 8 MG PO TBDP
8.0000 mg | ORAL_TABLET | Freq: Once | ORAL | Status: AC
Start: 2016-07-05 — End: 2016-07-05
  Administered 2016-07-05: 8 mg via ORAL
  Filled 2016-07-05: qty 1

## 2016-07-05 NOTE — MAU Note (Signed)
Pt reports a lot of nausea and vomiting that started about 2 weeks ago. States she is able to keep some things down, but has not had a meal recently. States she also started having some vaginal discharge yesterday with an odor. Pt denies itching, but has some burning with urination. States she has an IUD-placed May 2017.

## 2016-07-05 NOTE — Discharge Instructions (Signed)
Bacterial Vaginosis Bacterial vaginosis is an infection of the vagina. It happens when too many germs (bacteria) grow in the vagina. This infection puts you at risk for infections from sex (STIs). Treating this infection can lower your risk for some STIs. You should also treat this if you are pregnant. It can cause your baby to be born early. Follow these instructions at home: Medicines  Take over-the-counter and prescription medicines only as told by your doctor.  Take or use your antibiotic medicine as told by your doctor. Do not stop taking or using it even if you start to feel better. General instructions  If you your sexual partner is a woman, tell her that you have this infection. She needs to get treatment if she has symptoms. If you have a female partner, he does not need to be treated.  During treatment: ? Avoid sex. ? Do not douche. ? Avoid alcohol as told. ? Avoid breastfeeding as told.  Drink enough fluid to keep your pee (urine) clear or pale yellow.  Keep your vagina and butt (rectum) clean. ? Wash the area with warm water every day. ? Wipe from front to back after you use the toilet.  Keep all follow-up visits as told by your doctor. This is important. Preventing this condition  Do not douche.  Use only warm water to wash around your vagina.  Use protection when you have sex. This includes: ? Latex condoms. ? Dental dams.  Limit how many people you have sex with. It is best to only have sex with the same person (be monogamous).  Get tested for STIs. Have your partner get tested.  Wear underwear that is cotton or lined with cotton.  Avoid tight pants and pantyhose. This is most important in summer.  Do not use any products that have nicotine or tobacco in them. These include cigarettes and e-cigarettes. If you need help quitting, ask your doctor.  Do not use illegal drugs.  Limit how much alcohol you drink. Contact a doctor if:  Your symptoms do not get  better, even after you are treated.  You have more discharge or pain when you pee (urinate).  You have a fever.  You have pain in your belly (abdomen).  You have pain with sex.  Your bleed from your vagina between periods. Summary  This infection happens when too many germs (bacteria) grow in the vagina.  Treating this condition can lower your risk for some infections from sex (STIs).  You should also treat this if you are pregnant. It can cause early (premature) birth.  Do not stop taking or using your antibiotic medicine even if you start to feel better. This information is not intended to replace advice given to you by your health care provider. Make sure you discuss any questions you have with your health care provider. Document Released: 10/06/2007 Document Revised: 09/12/2015 Document Reviewed: 09/12/2015 Elsevier Interactive Patient Education  2017 Elsevier Inc.  Nausea, Adult Feeling sick to your stomach (nausea) means that your stomach is upset or you feel like you have to throw up (vomit). Feeling sick to your stomach is usually not serious, but it may be an early sign of a more serious medical problem. As you feel sicker to your stomach, it can lead to throwing up (vomiting). If you throw up, or if you are not able to drink enough fluids, there is a risk of dehydration. Dehydration can make you feel tired and thirsty, have a dry mouth, and pee (  urinate) less often. Older adults and people who have other diseases or a weak defense (immune) system have a higher risk of dehydration. The main goal of treating this condition is to:  Limit how often you feel sick to your stomach.  Prevent throwing up and dehydration.  Follow these instructions at home: Follow instructions from your doctor about how to care for yourself at home. Eating and drinking Follow these recommendations as told by your doctor:  Take an oral rehydration solution (ORS). This is a drink that is sold at  pharmacies and stores.  Drink clear fluids in small amounts as you are able, such as: ? Water. ? Ice chips. ? Fruit juice that has water added (diluted fruit juice). ? Low-calorie sports drinks.  Eat bland, easy to digest foods in small amounts as you are able, such as: ? Bananas. ? Applesauce. ? Rice. ? Lean meats. ? Toast. ? Crackers.  Avoid drinking fluids that contain a lot of sugar or caffeine.  Avoid alcohol.  Avoid spicy or fatty foods.  General instructions  Drink enough fluid to keep your pee (urine) clear or pale yellow.  Wash your hands often. If you cannot use soap and water, use hand sanitizer.  Make sure that all people in your household wash their hands well and often.  Rest at home while you get better.  Take over-the-counter and prescription medicines only as told by your doctor.  Breathe slowly and deeply when you feel sick to your stomach.  Watch your condition for any changes.  Keep all follow-up visits as told by your doctor. This is important. Contact a doctor if:  You have a headache.  You have new symptoms.  You feel sicker to your stomach.  You have a fever.  You feel light-headed or dizzy.  You throw up.  You are not able to keep fluids down. Get help right away if:  You have pain in your chest, neck, arm, or jaw.  You feel very weak or you pass out (faint).  You have throw up that is bright red or looks like coffee grounds.  You have bloody or black poop (stools), or poop that looks like tar.  You have a very bad headache, a stiff neck, or both.  You have very bad pain, cramping, or bloating in your belly.  You have a rash.  You have trouble breathing or you are breathing very quickly.  Your heart is beating very quickly.  Your skin feels cold and clammy.  You feel confused.  You have pain while peeing.  You have signs of dehydration, such as: ? Dark pee, or very little or no pee. ? Cracked lips. ? Dry  mouth. ? Sunken eyes. ? Sleepiness. ? Weakness. These symptoms may be an emergency. Do not wait to see if the symptoms will go away. Get medical help right away. Call your local emergency services (911 in the [place] Do not drive yourself to the hospital. This information is not intended to replace advice given to you by your health care provider. Make sure you discuss any questions you have with your health care provider. Document Released: 12/16/2010 Document Revised: 06/04/2015 Document Reviewed: 09/02/2014 Elsevier Interactive Patient Education  [employer].

## 2016-07-05 NOTE — MAU Note (Signed)
Per pt., IUD placed May 2016.

## 2016-07-05 NOTE — MAU Provider Note (Signed)
History     CSN: [id number]  Arrival date and time: 07/05/16 [id number]   First Provider Initiated Contact with Patient 07/05/16 2104      Chief Complaint  Patient presents with  . Vaginal Discharge   HPI Ms. Erika Townsend is a 26 y.o. [id number] who presents to MAU today with complaint of nausea and vomiting x 2 weeks. She states that she recently started back eating meat and fried foods. She denies fever or diarrhea. She has an IUD in place and has noted some brown discharge with foul odor since Sunday. She denies heavy bleeding or abdominal pain.   OB History    Gravida Para Term Preterm AB Living   3 1 1   2 1    SAB TAB Ectopic Multiple Live Births   2     0 1      Past Medical History:  Diagnosis Date  . Abnormal glucose tolerance in pregnancy 03/23/2014  . Asherman syndrome   . Cervical cerclage suture present in third trimester 04/19/2014  . Diabetes mellitus without complication (HCC)    pt states has problems with BS, but not diabetic  . Endometritis following delivery 12/03/2012  . Gestational diabetes   . Incompetence of cervix   . PCOS (polycystic ovarian syndrome)   . S/P cesarean section 04/21/2014  . Traumatic injury during pregnancy in third trimester 03/23/2014    Past Surgical History:  Procedure Laterality Date  . CERVICAL CERCLAGE N/A 11/10/2012   Procedure: CERCLAGE CERVICAL;  Surgeon: Oliver PilaKathy W Richardson, MD;  Location: [facility];  Service: Gynecology;  Laterality: N/A;  . CERVICAL CERCLAGE N/A 12/01/2012   Procedure: CERCLAGE CERVICAL REMOVAL;  Surgeon: Sherron MondayJody [name], MD;  Location: [facility];  Service: Gynecology;  Laterality: N/A;  . CESAREAN SECTION N/A 04/21/2014   Procedure: CESAREAN SECTION;  Surgeon: Sherian ReinJody [name]-Stuckert, MD;  Location: [facility];  Service: Obstetrics;  Laterality: N/A;  [facility]  . DILATION AND CURETTAGE OF UTERUS  01/26/2012   following a miscarriage  . DILATION AND EVACUATION N/A 12/01/2012   Procedure:  DILATATION AND EVACUATION;  Surgeon: Sherron MondayJody [name], MD;  Location: [facility];  Service: Gynecology;  Laterality: N/A;  . DILATION AND EVACUATION N/A 12/03/2012   Procedure: DILATATION AND EVACUATION;  Surgeon: Sherron MondayJody [name], MD;  Location: [facility];  Service: Gynecology;  Laterality: N/A;  . HYSTEROSCOPY W/D&C N/A 02/06/2013   Procedure:  SUCTION D&C HYSTEROSCOPY/LYSIS OF ADHESIONS/ INTRAOP HSG COOK UTERINE STENT PLACEMENT;  Surgeon: Fermin Schwabamer Yalcinkaya, MD;  Location: [facility];  Service: Gynecology;  Laterality: N/A;    Family History  Problem Relation Age of Onset  . Stroke Maternal Grandmother   . Heart disease Maternal Grandmother   . Cancer Maternal Grandfather     Social History  Substance Use Topics  . Smoking status: Never Smoker  . Smokeless tobacco: Never Used  . Alcohol use No    Allergies:  Allergies  Allergen Reactions  . Cinnamon Shortness Of Breath  . Penicillins Shortness Of Breath and Swelling    Has patient had a PCN reaction causing immediate rash, facial/tongue/throat swelling, SOB or lightheadedness with hypotension: Yes Has patient had a PCN reaction causing severe rash involving mucus membranes or skin necrosis: No Has patient had a PCN reaction that required hospitalization: Yes Has patient had a PCN reaction occurring within the last 10 years: No If all of the above answers are "NO", then may proceed with Cephalosporin use.   . Diphenhydramine Hives  .  Flagyl [Metronidazole] Itching and Other (See Comments)    Reaction:  Bumps in the mouth   . Tape Rash    No prescriptions prior to admission.    Review of Systems  Constitutional: Negative for fever.  Gastrointestinal: Positive for nausea and vomiting. Negative for abdominal pain, constipation and diarrhea.  Genitourinary: Positive for vaginal bleeding and vaginal discharge. Negative for dysuria, frequency and urgency.   Physical Exam   Blood pressure 133/79, pulse 84, temperature 98.2 F  (36.8 C), temperature source Oral, resp. rate 17, height 5' 4.5" (1.638 m), weight 259 lb (117.5 kg), SpO2 100 %, unknown if currently breastfeeding.  Physical Exam  Nursing note and vitals reviewed. Constitutional: She is oriented to person, place, and time. She appears well-developed and well-nourished. No distress.  HENT:  Head: Normocephalic and atraumatic.  Cardiovascular: Normal rate.   Respiratory: Effort normal.  GI: Soft. She exhibits no distension and no mass. There is no tenderness. There is no rebound and no guarding.  Genitourinary: Uterus is not enlarged and not tender. Cervix exhibits no motion tenderness, no discharge and no friability. Right adnexum displays no mass and no tenderness. Left adnexum displays no mass and no tenderness. No bleeding in the vagina. Vaginal discharge (small, white) found.  Neurological: She is alert and oriented to person, place, and time.  Skin: Skin is warm and dry. No erythema.  Psychiatric: She has a normal mood and affect.    Results for orders placed or performed during the hospital encounter of 07/05/16 (from the past 24 hour(s))  Urinalysis, Routine w reflex microscopic     Status: None   Collection Time: 07/05/16  7:54 PM  Result Value Ref Range   Color, Urine YELLOW YELLOW   APPearance CLEAR CLEAR   Specific Gravity, Urine 1.025 1.005 - 1.030   pH 5.0 5.0 - 8.0   Glucose, UA NEGATIVE NEGATIVE mg/dL   Hgb urine dipstick NEGATIVE NEGATIVE   Bilirubin Urine NEGATIVE NEGATIVE   Ketones, ur NEGATIVE NEGATIVE mg/dL   Protein, ur NEGATIVE NEGATIVE mg/dL   Nitrite NEGATIVE NEGATIVE   Leukocytes, UA NEGATIVE NEGATIVE  Pregnancy, urine POC     Status: None   Collection Time: 07/05/16  8:31 PM  Result Value Ref Range   Preg Test, Ur NEGATIVE NEGATIVE  Wet prep, genital     Status: Abnormal   Collection Time: 07/05/16  9:15 PM  Result Value Ref Range   Yeast Wet Prep HPF POC NONE SEEN NONE SEEN   Trich, Wet Prep NONE SEEN NONE SEEN    Clue Cells Wet Prep HPF POC PRESENT (A) NONE SEEN   WBC, Wet Prep HPF POC MODERATE (A) NONE SEEN   Sperm NONE SEEN     MAU Course  Procedures None  MDM UPT - negative UA today without evidence of dehydration or infection Wet prep, GC/Chlamydia, HIV and RPR today  8 mg ODT Zofran given   Assessment and Plan  A: Nausea and vomiting, diet related Bacterial vaginosis   P: Discharge home Rx for Clindamycin given to patient  Warning signs for worsening condition discussed Patient advised to follow-up with [facility] if N/V worsens Recommendations for PCP given for continued management of non-OB/GYN issues Patient may return to MAU as needed or if her condition were to change or worsen  Vonzella Nipple, PA-C 07/05/2016, 10:52 PM

## 2016-07-06 LAB — RPR: RPR Ser Ql: NONREACTIVE

## 2016-07-06 LAB — GC/CHLAMYDIA PROBE AMP (~~LOC~~) NOT AT ARMC
Chlamydia: NEGATIVE
Neisseria Gonorrhea: NEGATIVE

## 2016-07-06 LAB — HIV ANTIBODY (ROUTINE TESTING W REFLEX): HIV Screen 4th Generation wRfx: NONREACTIVE

## 2016-07-14 ENCOUNTER — Ambulatory Visit (HOSPITAL_COMMUNITY)
Admission: EM | Admit: 2016-07-14 | Discharge: 2016-07-14 | Disposition: A | Discharge status: Home / Self Care | Payer: Medicaid Other

## 2016-08-09 ENCOUNTER — Encounter: Payer: Self-pay | Admitting: Family Medicine

## 2016-08-09 ENCOUNTER — Ambulatory Visit (INDEPENDENT_AMBULATORY_CARE_PROVIDER_SITE_OTHER): Payer: Medicaid Other | Admitting: Family Medicine

## 2016-08-09 VITALS — BP 128/74 | HR 77 | Temp 98.2°F | Ht 64.5 in | Wt 258.0 lb

## 2016-08-09 DIAGNOSIS — Z0000 Encounter for general adult medical examination without abnormal findings: Secondary | ICD-10-CM

## 2016-08-09 NOTE — Progress Notes (Signed)
Subjective:    Patient ID: Rolene CourseJasmine Mcbain, female    DOB: Jun 29, 1990, [age] y.o.   MRN: [id number]   CC: Initial encounter with PCP  HPI: Patient is a 26 yo female with a past medical history significant for migraines, incompetent cervix and GDM who present today to establish care.  -IUD Removal/contraception:  Patient reports that she would like her IUD remove because she has not felt like herself since it was placed. Patient report multiple episodes of BV, some nausea and other subjective symptoms. Patient reports initially IUD was not placed correctly and had to be reposition. Patient would like to discuss options for contraception.  -Weight loss: Patient reports that she has gained over 75 lb since the birth of her daughter two years ago. She has been exercising and making some dietary changes without any significant change in weight. Patient is concerned as she would like to return to a normal size.  Smoking status reviewed   ROS: all other systems were reviewed and are negative other than in the HPI   Past Medical History:  Diagnosis Date  . Abnormal glucose tolerance in pregnancy 03/23/2014  . Asherman syndrome   . Cervical cerclage suture present in third trimester 04/19/2014  . Diabetes mellitus without complication (HCC)    pt states has problems with BS, but not diabetic  . Endometritis following delivery 12/03/2012  . Gestational diabetes   . Incompetence of cervix   . PCOS (polycystic ovarian syndrome)   . S/P cesarean section 04/21/2014  . Traumatic injury during pregnancy in third trimester 03/23/2014    Past Surgical History:  Procedure Laterality Date  . CERVICAL CERCLAGE N/A 11/10/2012   Procedure: CERCLAGE CERVICAL;  Surgeon: Oliver PilaKathy W Richardson, MD;  Location[facility];  Service: Gynecology;  Laterality: N/A;  . CERVICAL CERCLAGE N/A 12/01/2012   Procedure: CERCLAGE CERVICAL REMOVAL;  Surgeon: Sherron MondayJody [name], MD;  [facility];  Service: Gynecology;  Laterality:  N/A;  . CESAREAN SECTION N/A 04/21/2014   Procedure: CESAREAN SECTION;  Surgeon: Sherian ReinJody [name]-Stuckert, MD;  [facility];  Service: Obstetrics;  Laterality: N/A;  [facility]  . DILATION AND CURETTAGE OF UTERUS  01/26/2012   following a miscarriage  . DILATION AND EVACUATION N/A 12/01/2012   Procedure: DILATATION AND EVACUATION;  Surgeon: Sherron MondayJody [name], MD;  Location[facility];  Service: Gynecology;  Laterality: N/A;  . DILATION AND EVACUATION N/A 12/03/2012   Procedure: DILATATION AND EVACUATION;  Surgeon: Sherron MondayJody [name], MD;  Location[facility];  Service: Gynecology;  Laterality: N/A;  . HYSTEROSCOPY W/D&C N/A 02/06/2013   Procedure:  SUCTION D&C HYSTEROSCOPY/LYSIS OF ADHESIONS/ INTRAOP HSG COOK UTERINE STENT PLACEMENT;  Surgeon: Fermin Schwabamer Yalcinkaya, MD;  Location: [facility];  Service: Gynecology;  Laterality: N/A;    Past medical history, surgical, family, and social history reviewed and updated in the EMR as appropriate.  Objective:  BP 128/74   Pulse 77   Temp 98.2 F (36.8 C) (Oral)   Ht 5' 4.5" (1.638 m)   Wt 258 lb (117 kg)   SpO2 100%   BMI 43.60 kg/m   Vitals and nursing note reviewed  General: NAD, pleasant, able to participate in exam Cardiac: RRR, normal heart sounds, no murmurs. 2+ radial and PT pulses bilaterally Respiratory: CTAB, normal effort, No wheezes, rales or rhonchi Abdomen: soft, nontender, nondistended, no hepatic or splenomegaly, +BS Extremities: no edema or cyanosis. WWP. Skin: warm and dry, no rashes noted Neuro: alert and oriented x4,  no focal deficits Psych: Normal affect and mood   Assessment & Plan:   #IUD Removal Discuss with patient removal and she would like to have IUD taken out given bad experience and poor tolerance. Patient is not interested in nexplanon or depo provera (used in the past). Patient would like to use the patch. I explain to patient given her weight it is less than ideal but still can  work. Patient will make another appointment after IUD removal to further discuss options --Make appointment to remove IUD in GYN clinic --Follow up with PCP post removal  #Weight loss Patient would like to return to pre pregnancy weight (~180). She has been making dietary changes and has been more active but has had minimal change in her weight. Patient is frustrated, but open to nutrition counseling. --Will schedule clinic visit with PCP and Dr.Sykes to discuss dietary plan    Lovena NeighboursAbdoulaye Nyela Cortinas, MD [facility] PGY-2

## 2016-08-09 NOTE — Patient Instructions (Signed)
It was great seeing you today! We have addressed the following issues today  1. I will check your thyroid function today we will discuss results at next visit 2. [facility] for IUD [facility]. We can discuss your option for contraception after the removal. 3. I will make an appointment for nutrition so we can discuss your diet, and I will let you know   If we did any lab work today, and the results require attention, either me or my nurse will get in touch with you. If everything is normal, you will get a letter in mail and a message via . If you don't hear from us in two weeks, please give us a call. Otherwise, we look forward to seeing you again at your next visit. If you have any questions or concerns before then, please call the clinic at [phone].  Please bring all your medications to every doctors visit  Sign up for My Chart to have easy access to your labs results, and communication with your Primary care physician. Please ask Front Desk for some assistance.   Please check-out at the front desk before leaving the clinic.    Take Care,   Dr. Sydnee Cabaliallo

## 2016-08-10 ENCOUNTER — Other Ambulatory Visit (HOSPITAL_COMMUNITY): Payer: Self-pay | Admitting: Medical

## 2016-08-10 LAB — TSH: TSH: 1.01 u[IU]/mL (ref 0.450–4.500)

## 2016-08-18 ENCOUNTER — Other Ambulatory Visit (HOSPITAL_COMMUNITY)
Admission: RE | Admit: 2016-08-18 | Discharge: 2016-08-18 | Disposition: A | Discharge status: Home / Self Care | Payer: Medicaid Other | Source: Ambulatory Visit | Attending: Family Medicine | Admitting: Family Medicine

## 2016-08-18 ENCOUNTER — Ambulatory Visit (INDEPENDENT_AMBULATORY_CARE_PROVIDER_SITE_OTHER): Payer: Medicaid Other | Admitting: Family Medicine

## 2016-08-18 VITALS — BP 122/90 | HR 78 | Temp 98.4°F | Wt 262.0 lb

## 2016-08-18 DIAGNOSIS — Z124 Encounter for screening for malignant neoplasm of cervix: Secondary | ICD-10-CM

## 2016-08-18 DIAGNOSIS — Z30432 Encounter for removal of intrauterine contraceptive device: Secondary | ICD-10-CM

## 2016-08-18 DIAGNOSIS — Z309 Encounter for contraceptive management, unspecified: Secondary | ICD-10-CM | POA: Insufficient documentation

## 2016-08-18 DIAGNOSIS — Z3009 Encounter for other general counseling and advice on contraception: Secondary | ICD-10-CM

## 2016-08-18 DIAGNOSIS — Z01419 Encounter for gynecological examination (general) (routine) without abnormal findings: Secondary | ICD-10-CM | POA: Insufficient documentation

## 2016-08-18 LAB — POCT URINE PREGNANCY: Preg Test, Ur: NEGATIVE

## 2016-08-18 MED ORDER — NORETHINDRONE 0.35 MG PO TABS: 1.0000 | ORAL_TABLET | Freq: Every day | ORAL | 3 refills | Status: DC

## 2016-08-18 NOTE — Assessment & Plan Note (Signed)
Patient had IUD removed without complication. See procedure note.   Patient requested progesterone only OCP. Started on northindrone 0.35mg  daily. Patient informed to call or return to office if she has any concerns.

## 2016-08-18 NOTE — Progress Notes (Signed)
Subjective: Chief Complaint  Patient presents with  . IUD Removal     HPI: Erika Townsend is a 26 y.o. presenting to clinic today to discuss the following:  1 IUD removal  Mrs Erika Townsend states that soon after she had placement of IUD in 2016 she began having intermittent episodes of abdominal pain with associated nausea and vomiting. She also states she had an increased frequency of bacterial vaginosis. She would like to have her IUD removed and start on progesterone only OCPs. Since her last documented Pap was in 2012 she also consented to having a pap done today.  She denies any other problems, symptoms, or concerns today.  Health Maintenance: none     ROS noted in HPI.   Past Medical, Surgical, Social, and Family History Reviewed & Updated per EMR.   Pertinent Historical Findings include:   History  Smoking Status  . Never Smoker  Smokeless Tobacco  . Never Used      Objective: BP 122/90   Pulse 78   Temp 98.4 F (36.9 C) (Oral)   Wt 262 lb (118.8 kg)   BMI 44.28 kg/m  Vitals and nursing notes reviewed  Physical Exam   Gen: Alert and Oriented x 3, NAD HEENT: Normocephalic, atraumatic, PERRLA, EOMI,  non-erythematous turbinates, normal pharyngeal mucosa Resp: CTAB, no wheezing, rales, or rhonchi, comfortable work of breathing CV: RRR, no murmurs, normal S1, S2 split, +2 pulses dorsalis pedis bilaterally Abd: non-distended, non-tender, soft, +bs in all four quadrants, no hepatosplenomegaly MSK: FROM in all four extremities Ext: no clubbing, cyanosis, or edema Skin: warm, dry, intact, no rashes    Results for orders placed or performed in visit on 08/18/16 (from the past 72 hour(s))  POCT urine pregnancy     Status: None   Collection Time: 08/18/16 10:45 AM  Result Value Ref Range   Preg Test, Ur Negative Negative    Assessment/Plan:  Screening for malignant neoplasm of cervix Routine screening Pap smear performed under supervision of Dr. Lum BabeEniola.  Assisted by Nurse Garen GramsAsha Benton. No complications, patient tolerate procedure well. Will contact patient if results are abnormal.  Contraceptive management Patient had IUD removed without complication. See procedure note.   Patient requested progesterone only OCP. Started on northindrone 0.35mg  daily. Patient informed to call or return to office if she has any concerns.    Please see problem based Assessment and Plan PATIENT EDUCATION PROVIDED: See AVS    Diagnosis and plan along with any newly prescribed medication(s) were discussed in detail with this patient today. The patient verbalized understanding and agreed with the plan. Patient advised if symptoms worsen return to clinic or ER.   Health Maintainance: none   Orders Placed This Encounter  Procedures  . POCT urine pregnancy    Meds ordered this encounter  Medications  . norethindrone (MICRONOR,CAMILA,ERRIN) 0.35 MG tablet    Sig: Take 1 tablet (0.35 mg total) by mouth daily.    Dispense:  1 Package    Refill:  3     Tim Erika ChafeLockamy, DO 08/18/2016, 11:02 AM PGY-1, [facility]  IUD Removal Procedure Note  Pre-operative Diagnosis: Abdominal pain with associated nausea and vomiting since implantation of IUD.  Post-operative Diagnosis: stable  Indications: side effects from IUD placement  Procedure Details  Urine pregnancy test was performed and was negative.  The risks (including infection, bleeding, pain, and uterine perforation) and benefits of the procedure were explained to the patient and Written informed consent was obtained.  Cervix cleansed with Betadine.  IUD removed without difficulty. Patient tolerated procedure well.  Pap smear was also performed.  Condition: Stable  Complications: None  Plan:  The patient was advised to call for any fever or for prolonged or severe pain or bleeding. She was advised to use NSAID as needed for mild to moderate pain.   Attending Physician  Documentation: I was present for or participated in the entire procedure, including opening and closing.

## 2016-08-18 NOTE — Progress Notes (Signed)
[facility]/OB/GYN ATTENDING NOTE Tom Ragsdale,MD I  have seen and examined this patient, reviewed their chart. I have discussed this patient with the resident. I agree with the resident's findings, assessment and care plan.All procedure was completed under my direct supervision.

## 2016-08-18 NOTE — Patient Instructions (Signed)
   IUD Removal POST-PROCEDURE INSTRUCTIONS  1. You may take Ibuprofen, Aleve or Tylenol for pain if needed.  Cramping should resolve within in 24 hours.  2. You may have a small amount of spotting.  You should wear a mini pad for the next few days.  3. Shower or bathe as normal  Today we removed your IUD (Intrauterine device). Please begin taking your oral contraceptive pill immediately. Please call if you have any concerns or symptoms. It was a pleasure meeting you today and thank you for letting me be a part of your care  Jules Schickim Barak Bialecki, DO PGY-1, [facility]

## 2016-08-18 NOTE — Assessment & Plan Note (Addendum)
Routine screening Pap smear performed under supervision of Dr. Lum BabeEniola. Assisted by Nurse Garen GramsAsha Benton. No complications, patient tolerate procedure well. Will contact patient if results are abnormal.

## 2016-08-22 ENCOUNTER — Telehealth: Payer: Self-pay

## 2016-08-22 LAB — CYTOLOGY - PAP: Diagnosis: NEGATIVE

## 2016-08-22 NOTE — Telephone Encounter (Signed)
-----   Message from Doreene ElandKehinde T Eniola, MD sent at 08/22/2016 11:26 AM EDT ----- Please call to inform patient that her PAP test is normal. Thanks.

## 2016-08-22 NOTE — Telephone Encounter (Signed)
Attempted to contact pt to inform of normal pap smear, pt did not answer. A vm was left asking to call the office for results. Please inform her of normal pap smear. Thanks!

## 2016-08-22 NOTE — Telephone Encounter (Signed)
Pt called back and was informed of normal pap results.

## 2016-08-25 ENCOUNTER — Ambulatory Visit: Payer: Medicaid Other | Admitting: Family Medicine

## 2016-09-06 IMAGING — DX DG ANKLE COMPLETE 3+V*R*
3 series · 3 of 3 positions shown · non-contrast
Comparison: No priors.

CLINICAL DATA: 24-year-old female thirty-five week pregnant patient
reporting decreased fetal movement after a fall at [DATE] a.m. today,
with injury to the right ankle.

EXAM:
RIGHT ANKLE - COMPLETE 3+ VIEW

[ankle ap]
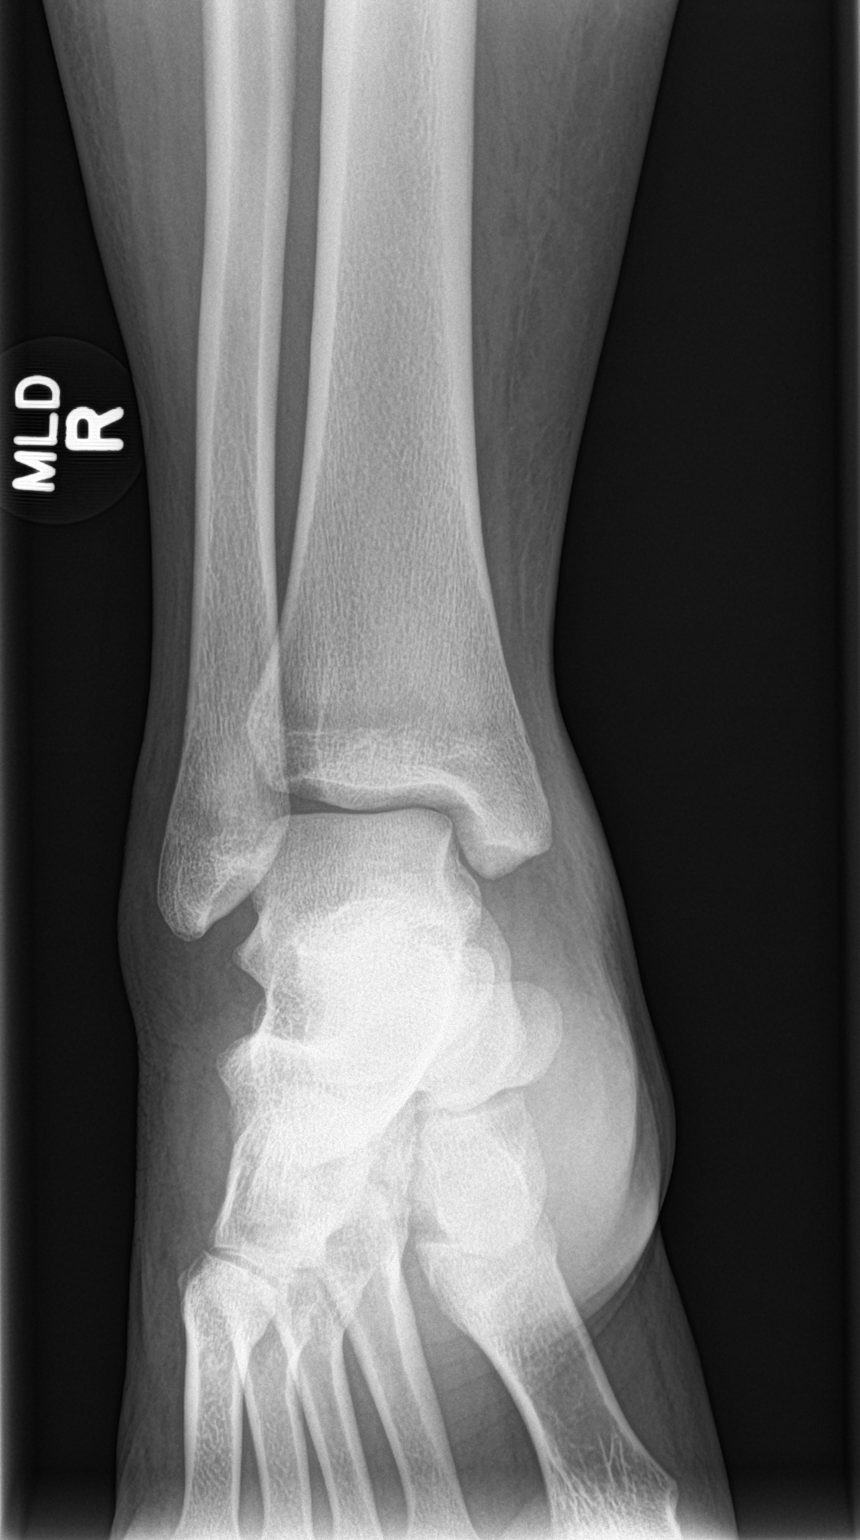

[ankle obl]
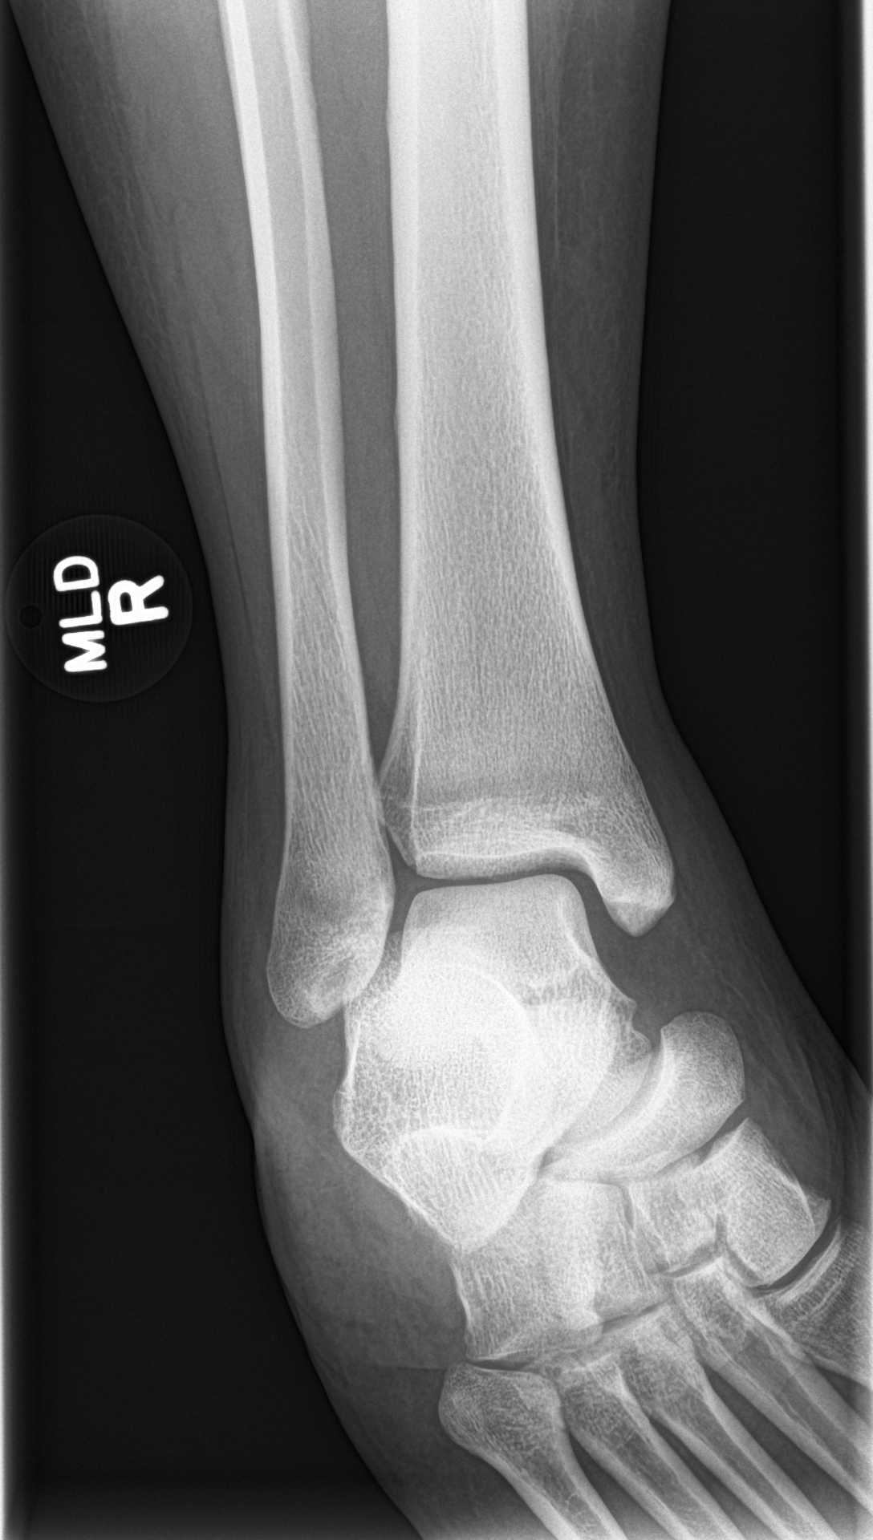

[ankle lat]
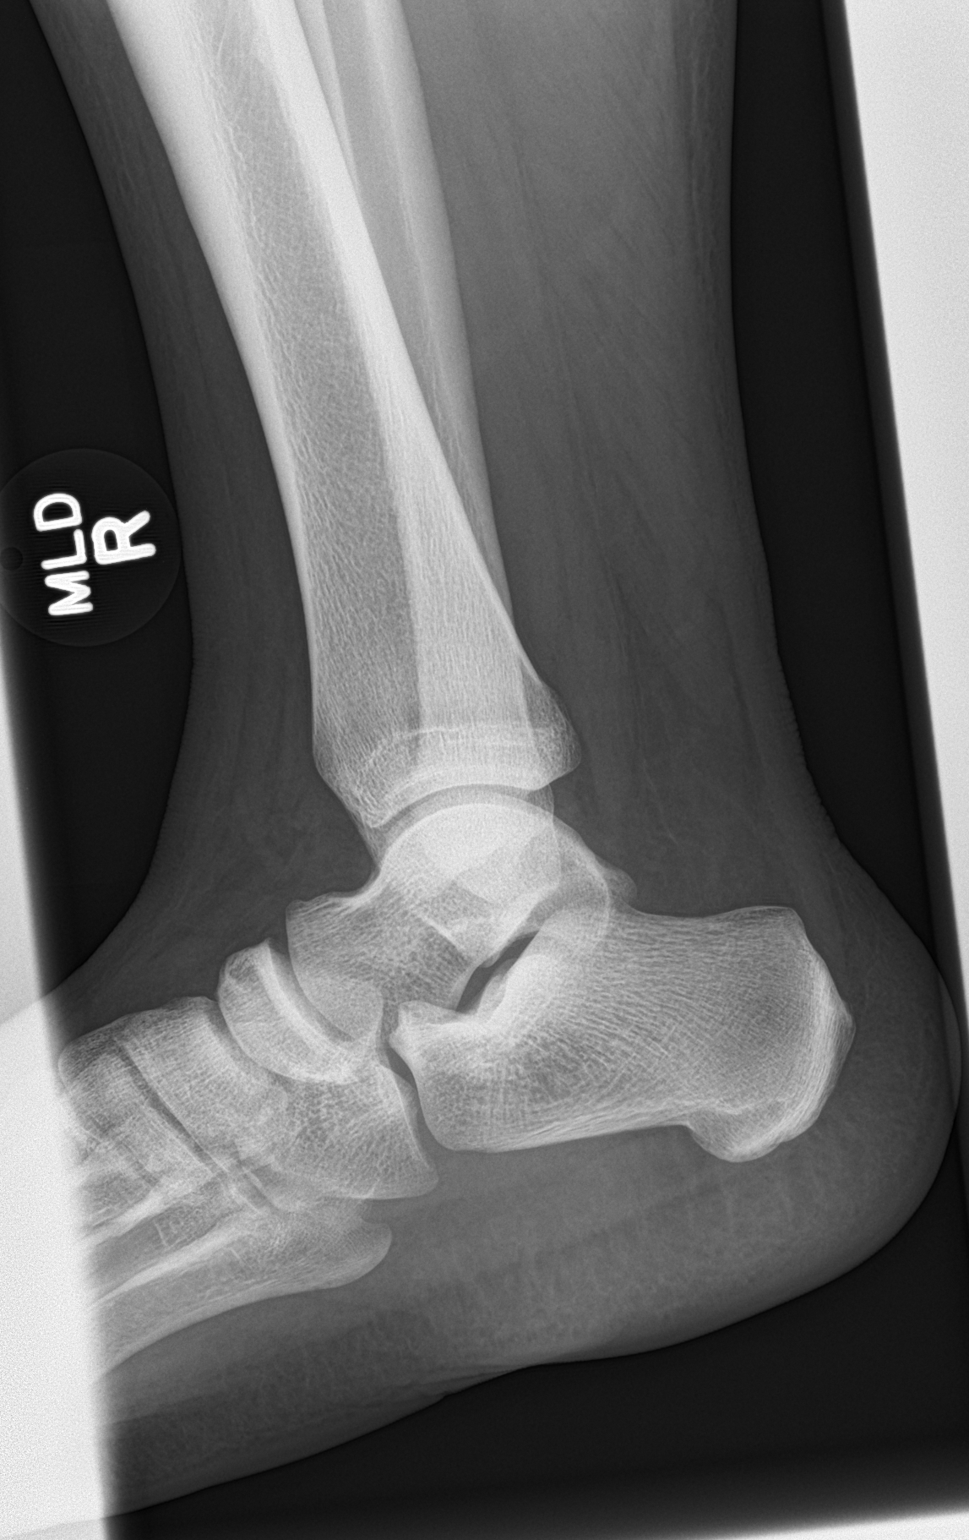

[3 of 3 positions shown; findings below may reference images not displayed]

FINDINGS: Mild soft tissue swelling around the ankle joint. No acute displaced
fracture, subluxation or dislocation.
IMPRESSION: 1. No acute bony abnormality of the right ankle, however, there is
diffuse mild soft tissue swelling around the ankle joint.

## 2016-09-30 ENCOUNTER — Encounter: Payer: Self-pay | Admitting: Student in an Organized Health Care Education/Training Program

## 2016-09-30 ENCOUNTER — Ambulatory Visit (INDEPENDENT_AMBULATORY_CARE_PROVIDER_SITE_OTHER): Payer: Medicaid Other | Admitting: Student in an Organized Health Care Education/Training Program

## 2016-09-30 VITALS — BP 114/82 | HR 88 | Temp 98.2°F | Ht 65.0 in | Wt 268.0 lb

## 2016-09-30 DIAGNOSIS — N926 Irregular menstruation, unspecified: Secondary | ICD-10-CM | POA: Diagnosis not present

## 2016-09-30 DIAGNOSIS — N912 Amenorrhea, unspecified: Secondary | ICD-10-CM

## 2016-09-30 LAB — POCT URINE PREGNANCY: Preg Test, Ur: NEGATIVE

## 2016-09-30 NOTE — Progress Notes (Signed)
CC: [place]  HPI: Erika Townsend is a 26 y.o. female with PMH significant for obesity who presents to [facility] today with amenorrhea of 2 weeks duration   8/9 - Patient was seen for IUD removal because she felt her abdominal pain and N/V were related to it.   Patient reports that she presents today for clinic because her menstrual period is now 14 days late. She endorses taking urine pregnancy tests at home which were inconclusive. Patient was prescribed OCPs after mirena was removed, however she reports that she has not been taking them. She reports that her partner would like to conceive and she is on the fence about whether or not she would like to conceive.  Of note, patient reports that over last month since mirena was removed, she has developed facial hair, which she states she has never before had. She also endorses skin changes with darkening skin on her neck. Denies acne. Denies hair elsewhere on her body.  When asked about a history of PCOS, patient states she thinks she has heard of that before but does not recall ever being diagnosed. When asked if she needed fertility treatment or to take medications to become pregnant with her first child, she states that she is not sure. She remembers going to OBGYN, but she is uncertain whether she had to take mediciation prior to conceiving.  Review of Symptoms:  See HPI for ROS.   CC, SH/smoking status, and VS noted.  Objective: BP 114/82   Pulse 88   Temp 98.2 F (36.8 C) (Oral)   Ht  (1.651 m)   Wt 121.6 kg (268 lb)   LMP 08/18/2016 (Approximate)   SpO2 97%   BMI 44.60 kg/m  GEN: NAD, alert, cooperative, and pleasant, obese EYE: no conjunctival injection, pupils equally round and reactive to light ENMT: normal tympanic light reflex, no nasal polyps,no rhinorrhea, no pharyngeal erythema or exudates NECK: full ROM, +acanthosis nigricans RESPIRATORY: clear to auscultation bilaterally with no wheezes, rhonchi or rales, good  effort CV: RRR, no m/r/g, no peripheral edema GI: soft, non-tender, non-distended, no hepatosplenomegaly SKIN: warm and dry, +hirsutism with facial hair noted over chin NEURO: II-XII grossly intact  Assessment and plan:  Amenorrhea Only 2 weeks duration, possibly 2/2 pregnancy, though this is unlikely with negative urine hcg in the office today.  Given new hirsutism and acanthosis nigricans after mirena was removed, I think patient likely has underlying PCOS that was being treated with her previous mirena. Once mirena was removed, she began developing these symptoms. - ordered hcg quant, HbA1c, TSH and prolactin today, however because it was after 5PM lab was unable to draw these labs - patient to return Monday for lab collection - if she is not pregnant and amenorrhea persists, would consider ordering testosterone, would check DHEA if it is abnormal. Could consider progesterone challenge. - if not pregnant, would plan to treat as PCOS because she clinically meets this diagnosis - if she decides she'd like to conceive, consider starting with metformin - I spoke with her a little to introduce the concept of PCOS, however she will require further counseling - patient to schedule follow up with PCP since this chronic issue will require ongoing management   Orders Placed This Encounter  Procedures  . hCG, serum, qualitative    Standing Status:   Future    Standing Expiration Date:   09/30/2017  . Prolactin    Standing Status:   Future    Standing Expiration Date:  09/30/2017  . TSH    Standing Status:   Future    Standing Expiration Date:   09/30/2017  . POCT urine pregnancy    No orders of the defined types were placed in this encounter.    Howard Pouch, MD,MS,  PGY2 10/02/2016 8:45 PM

## 2016-09-30 NOTE — Patient Instructions (Signed)
Please come in for a lab visit on Monday so that we can collect blood.   Your symptoms are suggestive of a condition called PCOS, and I would like to test you for this further. The treatment for PCOS would be oral contraceptive pills.  Our clinic's number is [phone]. Please call with questions or concerns about what we discussed today.  Be well, Dr. Mosetta Putt

## 2016-10-02 DIAGNOSIS — N912 Amenorrhea, unspecified: Secondary | ICD-10-CM | POA: Insufficient documentation

## 2016-10-02 NOTE — Assessment & Plan Note (Addendum)
Only 2 weeks duration, possibly 2/2 pregnancy, though this is unlikely with negative urine hcg in the office today.  Given new hirsutism and acanthosis nigricans after mirena was removed, I think patient likely has underlying PCOS that was being treated with her previous mirena. Once mirena was removed, she began developing these symptoms. - ordered hcg quant, HbA1c, TSH and prolactin today, however because it was after 5PM lab was unable to draw these labs - patient to return Monday for lab collection - if she is not pregnant and amenorrhea persists, would consider ordering testosterone, would check DHEA if it is abnormal. Could consider progesterone challenge. - if not pregnant, would plan to treat as PCOS because she clinically meets this diagnosis - if she decides she'd like to conceive, consider starting with metformin - I spoke with her a little to introduce the concept of PCOS, however she will require further counseling - patient to schedule follow up with PCP since this chronic issue will require ongoing management

## 2016-10-03 ENCOUNTER — Other Ambulatory Visit: Payer: Self-pay

## 2016-10-03 ENCOUNTER — Other Ambulatory Visit (HOSPITAL_COMMUNITY): Payer: Self-pay | Admitting: Medical

## 2016-10-03 ENCOUNTER — Other Ambulatory Visit: Payer: Medicaid Other

## 2016-10-03 DIAGNOSIS — N912 Amenorrhea, unspecified: Secondary | ICD-10-CM

## 2016-10-03 DIAGNOSIS — N926 Irregular menstruation, unspecified: Secondary | ICD-10-CM

## 2016-10-04 LAB — HCG, SERUM, QUALITATIVE: HCG, BETA SUBUNIT, QUAL, SERUM: NEGATIVE m[IU]/mL (ref <6)

## 2016-10-04 LAB — TSH: TSH: 1.01 u[IU]/mL (ref 0.450–4.500)

## 2016-10-04 LAB — PROLACTIN: PROLACTIN: 16.3 ng/mL (ref 4.8–23.3)

## 2016-10-05 ENCOUNTER — Encounter: Payer: Self-pay | Admitting: Student in an Organized Health Care Education/Training Program

## 2016-10-31 ENCOUNTER — Encounter (HOSPITAL_BASED_OUTPATIENT_CLINIC_OR_DEPARTMENT_OTHER): Payer: Self-pay | Admitting: *Deleted

## 2016-10-31 ENCOUNTER — Emergency Department (HOSPITAL_BASED_OUTPATIENT_CLINIC_OR_DEPARTMENT_OTHER)
Admission: EM | Admit: 2016-10-31 | Discharge: 2016-10-31 | Disposition: A | Discharge status: Home / Self Care | Payer: Medicaid Other | Attending: Emergency Medicine | Admitting: Emergency Medicine

## 2016-10-31 DIAGNOSIS — R05 Cough: Secondary | ICD-10-CM | POA: Diagnosis present

## 2016-10-31 DIAGNOSIS — J069 Acute upper respiratory infection, unspecified: Secondary | ICD-10-CM | POA: Diagnosis not present

## 2016-10-31 DIAGNOSIS — Z79899 Other long term (current) drug therapy: Secondary | ICD-10-CM | POA: Insufficient documentation

## 2016-10-31 DIAGNOSIS — E119 Type 2 diabetes mellitus without complications: Secondary | ICD-10-CM | POA: Insufficient documentation

## 2016-10-31 DIAGNOSIS — B349 Viral infection, unspecified: Secondary | ICD-10-CM | POA: Diagnosis not present

## 2016-10-31 DIAGNOSIS — B9789 Other viral agents as the cause of diseases classified elsewhere: Secondary | ICD-10-CM

## 2016-10-31 LAB — URINALYSIS, MICROSCOPIC (REFLEX): WBC UA: NONE SEEN WBC/hpf (ref 0–5)

## 2016-10-31 LAB — URINALYSIS, ROUTINE W REFLEX MICROSCOPIC
Bilirubin Urine: NEGATIVE
GLUCOSE, UA: NEGATIVE mg/dL
KETONES UR: NEGATIVE mg/dL
LEUKOCYTES UA: NEGATIVE
Nitrite: NEGATIVE
PH: 6 (ref 5.0–8.0)
Protein, ur: NEGATIVE mg/dL
Specific Gravity, Urine: 1.02 (ref 1.005–1.030)

## 2016-10-31 LAB — PREGNANCY, URINE: Preg Test, Ur: NEGATIVE

## 2016-10-31 LAB — RAPID STREP SCREEN (MED CTR MEBANE ONLY): Streptococcus, Group A Screen (Direct): NEGATIVE

## 2016-10-31 MED ORDER — BENZONATATE 100 MG PO CAPS: 100.0000 mg | ORAL_CAPSULE | Freq: Three times a day (TID) | ORAL | 0 refills | Status: DC

## 2016-10-31 MED ORDER — GUAIFENESIN ER 1200 MG PO TB12: 1.0000 | ORAL_TABLET | Freq: Two times a day (BID) | ORAL | 0 refills | Status: DC

## 2016-10-31 NOTE — ED Notes (Signed)
ED Provider at bedside. 

## 2016-10-31 NOTE — ED Provider Notes (Signed)
[facility] EMERGENCY DEPARTMENT Provider Note   CSN: [id number] Arrival date & time: 10/31/16  1248     History   Chief Complaint Chief Complaint  Patient presents with  . Cough    HPI Erika Townsend is a 26 y.o. female.  HPI Pt started having cough and congestion on Friday.  She has had nasal congestion, chills without fevers, and a non productive cough.  Her throat itches.  No diarrhea.  She did vomit on the way to work this am.  That has not continued since.    She also noticed pain with urination.  Mild frequency.  No urgency.  No back pain. Past Medical History:  Diagnosis Date  . Abnormal glucose tolerance in pregnancy 03/23/2014  . Asherman syndrome   . Cervical cerclage suture present in third trimester 04/19/2014  . Diabetes mellitus without complication (HCC)    pt states has problems with BS, but not diabetic  . Endometritis following delivery 12/03/2012  . Gestational diabetes   . Incompetence of cervix   . PCOS (polycystic ovarian syndrome)   . S/P cesarean section 04/21/2014  . Traumatic injury during pregnancy in third trimester 03/23/2014    Patient Active Problem List   Diagnosis Date Noted  . Amenorrhea 10/02/2016  . Screening for malignant neoplasm of cervix 08/18/2016  . S/P cesarean section 04/21/2014  . Prior poor obstetrical history in third trimester, antepartum 04/19/2014  . Cervical cerclage suture present in third trimester 04/19/2014  . Abnormal glucose tolerance in pregnancy 03/23/2014  . History of incompetent cervix, currently pregnant in second trimester   . Low lying placenta without hemorrhage, antepartum   . Preterm contractions   . [redacted] weeks gestation of pregnancy   . Chorioamnionitis 12/05/2012  . S/P dilatation and curettage 12/03/2012  . Endometritis following delivery 12/03/2012  . SVD (spontaneous vaginal delivery) 12/02/2012  . Retained placenta parts or membranes, postpartum 12/02/2012  . S/P dilation and  curettage 12/02/2012  . Incompetent cervix 11/10/2012  . McDonald cerclage present 11/10/2012    Past Surgical History:  Procedure Laterality Date  . CERVICAL CERCLAGE N/A 11/10/2012   Procedure: CERCLAGE CERVICAL;  Surgeon: Oliver PilaKathy W Richardson, MD;  Location: [facility];  Service: Gynecology;  Laterality: N/A;  . CERVICAL CERCLAGE N/A 12/01/2012   Procedure: CERCLAGE CERVICAL REMOVAL;  Surgeon: Sherron MondayJody [name], MD;  Location: [facility];  Service: Gynecology;  Laterality: N/A;  . CESAREAN SECTION N/A 04/21/2014   Procedure: CESAREAN SECTION;  Surgeon: Sherian ReinJody [name]-Stuckert, MD;  Location: [facility];  Service: Obstetrics;  Laterality: N/A;  [facility]  . DILATION AND CURETTAGE OF UTERUS  01/26/2012   following a miscarriage  . DILATION AND EVACUATION N/A 12/01/2012   Procedure: DILATATION AND EVACUATION;  Surgeon: Sherron MondayJody [name], MD;  Location: [facility];  Service: Gynecology;  Laterality: N/A;  . DILATION AND EVACUATION N/A 12/03/2012   Procedure: DILATATION AND EVACUATION;  Surgeon: Sherron MondayJody [name], MD;  Location: [facility];  Service: Gynecology;  Laterality: N/A;  . HYSTEROSCOPY W/D&C N/A 02/06/2013   Procedure:  SUCTION D&C HYSTEROSCOPY/LYSIS OF ADHESIONS/ INTRAOP HSG COOK UTERINE STENT PLACEMENT;  Surgeon: Fermin Schwabamer Yalcinkaya, MD;  Location: [facility];  Service: Gynecology;  Laterality: N/A;    OB History    Gravida Para Term Preterm AB Living   3 1 1   2 1    SAB TAB Ectopic Multiple Live Births   2     0 1       Home  Medications    Prior to Admission medications   Medication Sig Start Date End Date Taking? Authorizing Provider  acetaminophen (TYLENOL) 500 MG tablet Take 1,000 mg by mouth every 6 (six) hours as needed for mild pain, moderate pain or headache.    [provider]  benzonatate (TESSALON) 100 MG capsule Take 1 capsule (100 mg total) by mouth every 8 (eight) hours. 10/31/16   Linwood Dibbles, MD  butalbital-acetaminophen-caffeine (FIORICET, ESGIC)  [phone] MG tablet Take 1 tablet by mouth every 6 (six) hours as needed for headache. 05/24/16 05/24/17  Marny Lowenstein, PA-C  clindamycin (CLEOCIN) 300 MG capsule Take 1 capsule (300 mg total) by mouth 2 (two) times daily. 07/05/16   Marny Lowenstein, PA-C  Guaifenesin 1200 MG TB12 Take 1 tablet (1,200 mg total) by mouth 2 (two) times daily at 10 AM and 5 PM. 10/31/16   Linwood Dibbles, MD  ibuprofen (ADVIL,MOTRIN) 800 MG tablet Take 800 mg by mouth 3 (three) times daily as needed.    [provider]  loratadine (CLARITIN) 10 MG tablet Take 10 mg by mouth daily.    [provider]  norethindrone (MICRONOR,CAMILA,ERRIN) 0.35 MG tablet Take 1 tablet (0.35 mg total) by mouth daily. 08/18/16   Doreene Eland, MD  ondansetron (ZOFRAN) 4 MG tablet Take 1 tablet (4 mg total) by mouth every 6 (six) hours. 07/05/16   Marny Lowenstein, PA-C  tetrahydrozoline-zinc (VISINE-AC) 0.05-0.25 % ophthalmic solution Place 2 drops into both eyes 3 (three) times daily as needed (for allergies).    [provider]    Family History Family History  Problem Relation Age of Onset  . Stroke Maternal Grandmother   . Heart disease Maternal Grandmother   . Cancer Maternal Grandfather     Social History Social History  Substance Use Topics  . Smoking status: Never Smoker  . Smokeless tobacco: Never Used  . Alcohol use No     Allergies   Cinnamon; Penicillins; Diphenhydramine; Flagyl [metronidazole]; and Tape   Review of Systems Review of Systems  All other systems reviewed and are negative.    Physical Exam Updated Vital Signs BP (!) 158/108 (BP Location: Right Wrist)   Pulse 81   Temp 98 F (36.7 C) (Oral)   Resp 18   Ht 1.6 m (5\' 3" )   Wt 95.3 kg (210 lb)   LMP 09/19/2016   SpO2 100%   BMI 37.20 kg/m   Physical Exam  Constitutional: She appears well-developed and well-nourished. No distress.  HENT:  Head: Normocephalic and atraumatic.  Right Ear: External ear normal.    Left Ear: External ear normal.  Mouth/Throat: No oral lesions. No uvula swelling or lacerations. Posterior oropharyngeal erythema present. No oropharyngeal exudate.  Eyes: Conjunctivae are normal. Right eye exhibits no discharge. Left eye exhibits no discharge. No scleral icterus.  Neck: Neck supple. No tracheal deviation present.  Cardiovascular: Normal rate, regular rhythm and intact distal pulses.   Pulmonary/Chest: Effort normal and breath sounds normal. No stridor. No respiratory distress. She has no wheezes. She has no rales.  Abdominal: Soft. Bowel sounds are normal. She exhibits no distension. There is no tenderness. There is no rebound and no guarding.  Musculoskeletal: She exhibits no edema or tenderness.  Neurological: She is alert. She has normal strength. No cranial nerve deficit (no facial droop, extraocular movements intact, no slurred speech) or sensory deficit. She exhibits normal muscle tone. She displays no seizure activity. Coordination normal.  Skin: Skin is warm and  dry. No rash noted.  Psychiatric: She has a normal mood and affect.  Nursing note and vitals reviewed.    ED Treatments / Results  Labs (all labs ordered are listed, but only abnormal results are displayed) Labs Reviewed  URINALYSIS, ROUTINE W REFLEX MICROSCOPIC - Abnormal; Notable for the following:       Result Value   Hgb urine dipstick SMALL (*)    All other components within normal limits  URINALYSIS, MICROSCOPIC (REFLEX) - Abnormal; Notable for the following:    Bacteria, UA FEW (*)    Squamous Epithelial / LPF 0-5 (*)    All other components within normal limits  RAPID STREP SCREEN (NOT AT [facility])  CULTURE, GROUP A STREP [facility])  PREGNANCY, URINE     Procedures Procedures (including critical care time)  Medications Ordered in ED Medications - No data to display   Initial Impression / Assessment and Plan / ED Course  I have reviewed the triage vital signs and the nursing  notes.  Pertinent labs & imaging results that were available during my care of the patient were reviewed by me and considered in my medical decision making (see chart for details).    Symptoms are consistent with a viral upper respiratory infection. There is no evidence to suggest pneumonia on my exam. Strep is negative. I discussed supportive treatment. I encouraged followup with the primary care doctor next week if symptoms have not resolved. Warning signs and reasons to return to the emergency room were discussed    Final Clinical Impressions(s) / ED Diagnoses   Final diagnoses:  Viral URI with cough    New Prescriptions Discharge Medication List as of 10/31/2016  4:13 PM    START taking these medications   Details  benzonatate (TESSALON) 100 MG capsule Take 1 capsule (100 mg total) by mouth every 8 (eight) hours., Starting Mon 10/31/2016, Print    Guaifenesin 1200 MG TB12 Take 1 tablet (1,200 mg total) by mouth 2 (two) times daily at 10 AM and 5 PM., Starting Mon 10/31/2016, Print         Linwood Dibbles, MD 11/01/16 1407

## 2016-10-31 NOTE — ED Triage Notes (Signed)
Cough. Dysuria.

## 2016-11-03 LAB — CULTURE, GROUP A STREP (THRC)

## 2016-11-08 ENCOUNTER — Ambulatory Visit: Payer: Medicaid Other | Admitting: Family Medicine

## 2016-12-04 ENCOUNTER — Other Ambulatory Visit: Payer: Self-pay | Admitting: Medical

## 2017-02-17 ENCOUNTER — Ambulatory Visit: Payer: Self-pay

## 2017-02-20 ENCOUNTER — Ambulatory Visit: Payer: Self-pay

## 2017-02-20 ENCOUNTER — Ambulatory Visit (INDEPENDENT_AMBULATORY_CARE_PROVIDER_SITE_OTHER): Payer: Self-pay

## 2017-02-20 DIAGNOSIS — Z111 Encounter for screening for respiratory tuberculosis: Secondary | ICD-10-CM

## 2017-02-20 NOTE — Progress Notes (Signed)
   Patient in for TB skin test for work. PPD placed on left forearm with no adverse effects. Return appt made for 02/22/17 at 3:30 pm to have it read. Ples SpecterAlisa Brake, RN [facility])

## 2017-02-22 ENCOUNTER — Telehealth: Payer: Self-pay | Admitting: *Deleted

## 2017-02-22 ENCOUNTER — Ambulatory Visit: Payer: Self-pay | Admitting: *Deleted

## 2017-02-22 DIAGNOSIS — Z111 Encounter for screening for respiratory tuberculosis: Secondary | ICD-10-CM

## 2017-02-22 NOTE — Telephone Encounter (Signed)
Pt called nurse line and lm about her "TB skin test"   Attempted to call patient back, but went straight to VM.  LM to return call.  Of note, she has a PPD read appt this afternoon. Dason Mosley, Maryjo RochesterJessica Dawn, CMA

## 2017-02-23 LAB — TB SKIN TEST: TB Skin Test: NEGATIVE

## 2017-02-23 NOTE — Progress Notes (Signed)
PPD Reading Note  Pt had appt at 3:30 to have TB test read, arrived late.  Unable to get here tomorrow. TB test read @ 5:10pm on 02/22/17.  Result: 0 mm induration. Interpretation: Negative  Fleeger, Maryjo RochesterJessica Dawn, CMA

## 2017-03-02 ENCOUNTER — Encounter: Payer: Medicaid Other | Admitting: Family Medicine

## 2017-03-27 ENCOUNTER — Ambulatory Visit: Payer: Self-pay | Admitting: Family Medicine

## 2017-08-03 ENCOUNTER — Inpatient Hospital Stay (HOSPITAL_COMMUNITY)
Admission: AD | Admit: 2017-08-03 | Discharge: 2017-08-03 | Disposition: A | Discharge status: Home / Self Care | Payer: Medicaid Other | Source: Ambulatory Visit | Attending: Obstetrics and Gynecology | Admitting: Obstetrics and Gynecology

## 2017-08-03 ENCOUNTER — Encounter (HOSPITAL_COMMUNITY): Payer: Self-pay | Admitting: *Deleted

## 2017-08-03 DIAGNOSIS — Z881 Allergy status to other antibiotic agents status: Secondary | ICD-10-CM | POA: Insufficient documentation

## 2017-08-03 DIAGNOSIS — Z888 Allergy status to other drugs, medicaments and biological substances status: Secondary | ICD-10-CM | POA: Insufficient documentation

## 2017-08-03 DIAGNOSIS — Z3202 Encounter for pregnancy test, result negative: Secondary | ICD-10-CM

## 2017-08-03 DIAGNOSIS — Z880 Allergy status to penicillin: Secondary | ICD-10-CM | POA: Insufficient documentation

## 2017-08-03 DIAGNOSIS — Z809 Family history of malignant neoplasm, unspecified: Secondary | ICD-10-CM | POA: Insufficient documentation

## 2017-08-03 DIAGNOSIS — E119 Type 2 diabetes mellitus without complications: Secondary | ICD-10-CM | POA: Insufficient documentation

## 2017-08-03 DIAGNOSIS — Z91018 Allergy to other foods: Secondary | ICD-10-CM | POA: Insufficient documentation

## 2017-08-03 DIAGNOSIS — E282 Polycystic ovarian syndrome: Secondary | ICD-10-CM | POA: Insufficient documentation

## 2017-08-03 DIAGNOSIS — R110 Nausea: Secondary | ICD-10-CM

## 2017-08-03 DIAGNOSIS — Z8249 Family history of ischemic heart disease and other diseases of the circulatory system: Secondary | ICD-10-CM | POA: Insufficient documentation

## 2017-08-03 DIAGNOSIS — Z9889 Other specified postprocedural states: Secondary | ICD-10-CM | POA: Insufficient documentation

## 2017-08-03 DIAGNOSIS — Z7984 Long term (current) use of oral hypoglycemic drugs: Secondary | ICD-10-CM | POA: Insufficient documentation

## 2017-08-03 DIAGNOSIS — Z823 Family history of stroke: Secondary | ICD-10-CM | POA: Insufficient documentation

## 2017-08-03 DIAGNOSIS — Z79899 Other long term (current) drug therapy: Secondary | ICD-10-CM | POA: Insufficient documentation

## 2017-08-03 LAB — URINALYSIS, ROUTINE W REFLEX MICROSCOPIC
BILIRUBIN URINE: NEGATIVE
Glucose, UA: NEGATIVE mg/dL
Hgb urine dipstick: NEGATIVE
KETONES UR: NEGATIVE mg/dL
LEUKOCYTES UA: NEGATIVE
NITRITE: NEGATIVE
Protein, ur: NEGATIVE mg/dL
SPECIFIC GRAVITY, URINE: 1.02 (ref 1.005–1.030)
pH: 6 (ref 5.0–8.0)

## 2017-08-03 LAB — POCT PREGNANCY, URINE: PREG TEST UR: NEGATIVE

## 2017-08-03 MED ORDER — METFORMIN HCL 500 MG PO TABS: 500.0000 mg | ORAL_TABLET | Freq: Two times a day (BID) | ORAL | 0 refills | Status: DC

## 2017-08-03 MED ORDER — ONDANSETRON 4 MG PO TBDP: 4.0000 mg | ORAL_TABLET | Freq: Three times a day (TID) | ORAL | 0 refills | Status: DC | PRN

## 2017-08-03 MED ORDER — ONDANSETRON 8 MG PO TBDP
8.0000 mg | ORAL_TABLET | Freq: Once | ORAL | Status: AC
  Administered 2017-08-03: 8 mg via ORAL
  Filled 2017-08-03: qty 1

## 2017-08-03 NOTE — MAU Provider Note (Signed)
History     CSN: [id number]  Arrival date and time: 08/03/17 1243   First Provider Initiated Contact with Patient 08/03/17 1404      Chief Complaint  Patient presents with  . Abdominal Pain  . Nausea   HPI  Ms. Erika Townsend is a 27 year old [id number] who presents to MAU with a chief complaint of nausea, abdominal pain, and amenorrhea since May. She has had abdominal pain for about 1.5 months in her lower abdomen that is a sharp pain and 10/10. No relief with tylenol or motrin. Patient has had nausea for about 4 weeks and it is relieved with rest. Patient's LMP was in May and since then she has spotting that is brownish-red tinged and last week was pink-tinged. She denies history of irregular menses. Patient denied GC/Chlamydia testing. Of note, patient was seen back in 09/2016 for a similar menstrual problem. Patient has been out of her metformin for her PCOS since around February-March. Patient has not been taking her OCPs prescribed in 08/2016 after her IUD removal.  OB History    Gravida  3   Para  1   Term  1   Preterm      AB  2   Living  1     SAB  2   TAB      Ectopic      Multiple  0   Live Births  1           Past Medical History:  Diagnosis Date  . Abnormal glucose tolerance in pregnancy 03/23/2014  . Asherman syndrome   . Diabetes mellitus without complication (HCC)    pt states has problems with BS, but not diabetic  . Endometritis following delivery 12/03/2012  . Gestational diabetes   . Incompetence of cervix   . PCOS (polycystic ovarian syndrome)   . S/P cesarean section 04/21/2014    Past Surgical History:  Procedure Laterality Date  . CERVICAL CERCLAGE N/A 11/10/2012   Procedure: CERCLAGE CERVICAL;  Surgeon: Oliver PilaKathy W Richardson, MD;  Location: [facility];  Service: Gynecology;  Laterality: N/A;  . CERVICAL CERCLAGE N/A 12/01/2012   Procedure: CERCLAGE CERVICAL REMOVAL;  Surgeon: Sherron MondayJody [name], MD;  Location: [facility];  Service: Gynecology;   Laterality: N/A;  . CESAREAN SECTION N/A 04/21/2014   Procedure: CESAREAN SECTION;  Surgeon: Sherian ReinJody [name]-Stuckert, MD;  Location: [facility];  Service: Obstetrics;  Laterality: N/A;  [facility]  . DILATION AND CURETTAGE OF UTERUS  01/26/2012   following a miscarriage  . DILATION AND EVACUATION N/A 12/01/2012   Procedure: DILATATION AND EVACUATION;  Surgeon: Sherron MondayJody [name], MD;  Location: [facility];  Service: Gynecology;  Laterality: N/A;  . DILATION AND EVACUATION N/A 12/03/2012   Procedure: DILATATION AND EVACUATION;  Surgeon: Sherron MondayJody [name], MD;  Location: [facility];  Service: Gynecology;  Laterality: N/A;  . HYSTEROSCOPY W/D&C N/A 02/06/2013   Procedure:  SUCTION D&C HYSTEROSCOPY/LYSIS OF ADHESIONS/ INTRAOP HSG COOK UTERINE STENT PLACEMENT;  Surgeon: Fermin Schwabamer Yalcinkaya, MD;  Location: [facility];  Service: Gynecology;  Laterality: N/A;    Family History  Problem Relation Age of Onset  . Stroke Maternal Grandmother   . Heart disease Maternal Grandmother   . Cancer Maternal Grandfather     Social History   Tobacco Use  . Smoking status: Never Smoker  . Smokeless tobacco: Never Used  Substance Use Topics  . Alcohol use: No  . Drug use: No    Allergies:  Allergies  Allergen Reactions  . Cinnamon Shortness Of Breath  . Penicillins Shortness Of Breath and Swelling    Has patient had a PCN reaction causing immediate rash, facial/tongue/throat swelling, SOB or lightheadedness with hypotension: Yes Has patient had a PCN reaction causing severe rash involving mucus membranes or skin necrosis: No Has patient had a PCN reaction that required hospitalization: Yes Has patient had a PCN reaction occurring within the last 10 years: No If all of the above answers are "NO", then may proceed with Cephalosporin use.   . Diphenhydramine Hives  . Flagyl [Metronidazole] Itching and Other (See Comments)    Reaction:  Bumps in the mouth   . Tape Rash    Medications  Prior to Admission  Medication Sig Dispense Refill Last Dose  . acetaminophen (TYLENOL) 500 MG tablet Take 1,000 mg by mouth every 6 (six) hours as needed for mild pain, moderate pain or headache.   Past Week at Unknown time  . benzonatate (TESSALON) 100 MG capsule Take 1 capsule (100 mg total) by mouth every 8 (eight) hours. 21 capsule 0   . clindamycin (CLEOCIN) 300 MG capsule Take 1 capsule (300 mg total) by mouth 2 (two) times daily. 14 capsule 0   . Guaifenesin 1200 MG TB12 Take 1 tablet (1,200 mg total) by mouth 2 (two) times daily at 10 AM and 5 PM. 14 each 0   . ibuprofen (ADVIL,MOTRIN) 800 MG tablet Take 800 mg by mouth 3 (three) times daily as needed.   05/24/2016 at Unknown time  . loratadine (CLARITIN) 10 MG tablet Take 10 mg by mouth daily.   05/23/2016 at Unknown time  . norethindrone (MICRONOR,CAMILA,ERRIN) 0.35 MG tablet Take 1 tablet (0.35 mg total) by mouth daily. 1 Package 3   . ondansetron (ZOFRAN) 4 MG tablet Take 1 tablet (4 mg total) by mouth every 6 (six) hours. 12 tablet 0   . tetrahydrozoline-zinc (VISINE-AC) 0.05-0.25 % ophthalmic solution Place 2 drops into both eyes 3 (three) times daily as needed (for allergies).   Past Week at Unknown time    Review of Systems  Constitutional: Negative for chills and fever.  HENT: Negative.   Eyes: Negative.   Respiratory: Negative for chest tightness and shortness of breath.   Cardiovascular: Negative for chest pain and palpitations.  Gastrointestinal: Positive for abdominal pain, nausea and vomiting. Negative for diarrhea.       Vomit yesterday  Genitourinary: Positive for menstrual problem. Negative for difficulty urinating, dysuria, flank pain, hematuria, vaginal bleeding and vaginal pain.  Skin: Negative.   Neurological: Positive for light-headedness. Negative for dizziness and headaches.   Physical Exam   Blood pressure 139/90, pulse 88, temperature 98.6 F (37 C), temperature source Oral, resp. rate 18, height 5' 3.5"  (1.613 m), weight 121.6 kg (268 lb 1.9 oz), last menstrual period 05/17/2017, unknown if currently breastfeeding.  Physical Exam  Constitutional: She appears well-developed and well-nourished. No distress.  HENT:  Head: Normocephalic and atraumatic.  Cardiovascular: Normal rate and regular rhythm.  Respiratory: Breath sounds normal.  GI: Soft. There is tenderness.  Mild tenderness and pressure to light palpation in suprapubic region  Skin: Skin is warm and dry.    MAU Course  Procedures Urine Pregnancy UA Zofran Results for orders placed or performed during the hospital encounter of 08/03/17 (from the past 24 hour(s))  Urinalysis, Routine w reflex microscopic     Status: None   Collection Time: 08/03/17  1:32 PM  Result Value Ref Range  Color, Urine YELLOW YELLOW   APPearance CLEAR CLEAR   Specific Gravity, Urine 1.020 1.005 - 1.030   pH 6.0 5.0 - 8.0   Glucose, UA NEGATIVE NEGATIVE mg/dL   Hgb urine dipstick NEGATIVE NEGATIVE   Bilirubin Urine NEGATIVE NEGATIVE   Ketones, ur NEGATIVE NEGATIVE mg/dL   Protein, ur NEGATIVE NEGATIVE mg/dL   Nitrite NEGATIVE NEGATIVE   Leukocytes, UA NEGATIVE NEGATIVE  Pregnancy, urine POC     Status: None   Collection Time: 08/03/17  1:45 PM  Result Value Ref Range   Preg Test, Ur NEGATIVE NEGATIVE    Assessment and Plan  PCOS  Discharge to Home Patient to restart metformin 500 mg BID Encourage follow up with PCP Patient advised to return to MAU if condition worsens of changes  Nausea not related to Pregnancy Patient to start Zofran prn for nausea  Negative Pregnancy Test  Candie Mile 08/03/2017, 3:03 PM

## 2017-08-03 NOTE — Discharge Instructions (Signed)
Polycystic Ovarian Syndrome °Polycystic ovarian syndrome (PCOS) is a common hormonal disorder among women of reproductive age. In most women with PCOS, many small fluid-filled sacs (cysts) grow on the ovaries, and the cysts are not part of a normal menstrual cycle. PCOS can cause problems with your menstrual periods and make it difficult to get pregnant. It can also cause an increased risk of miscarriage with pregnancy. If it is not treated, PCOS can lead to serious health problems, such as diabetes and heart disease. °What are the causes? °The cause of PCOS is not known, but it may be the result of a combination of certain factors, such as: °· Irregular menstrual cycle. °· High levels of certain hormones (androgens). °· Problems with the hormone that helps to control blood sugar (insulin resistance). °· Certain genes. ° °What increases the risk? °This condition is more likely to develop in women who have a family history of PCOS. °What are the signs or symptoms? °Symptoms of PCOS may include: °· Multiple ovarian cysts. °· Infrequent periods or no periods. °· Periods that are too frequent or too heavy. °· Unpredictable periods. °· Inability to get pregnant (infertility) because of not ovulating. °· Increased growth of hair on the face, chest, stomach, back, thumbs, thighs, or toes. °· Acne or oily skin. Acne may develop during adulthood, and it may not respond to treatment. °· Pelvic pain. °· Weight gain or obesity. °· Patches of thickened and dark brown or black skin on the neck, arms, breasts, or thighs (acanthosis nigricans). °· Excess hair growth on the face, chest, abdomen, or upper thighs (hirsutism). ° °How is this diagnosed? °This condition is diagnosed based on: °· Your medical history. °· A physical exam, including a pelvic exam. Your health care provider may look for areas of increased hair growth on your skin. °· Tests, such as: °? Ultrasound. This may be used to examine the ovaries and the lining of the  uterus (endometrium) for cysts. °? Blood tests. These may be used to check levels of sugar (glucose), female hormone (testosterone), and female hormones (estrogen and progesterone) in your blood. ° °How is this treated? °There is no cure for PCOS, but treatment can help to manage symptoms and prevent more health problems from developing. Treatment varies depending on: °· Your symptoms. °· Whether you want to have a baby or whether you need birth control (contraception). ° °Treatment may include nutrition and lifestyle changes along with: °· Progesterone hormone to start a menstrual period. °· Birth control pills to help you have regular menstrual periods. °· Medicines to make you ovulate, if you want to get pregnant. °· Medicine to reduce excessive hair growth. °· Surgery, in severe cases. This may involve making small holes in one or both of your ovaries. This decreases the amount of testosterone that your body produces. ° °Follow these instructions at home: °· Take over-the-counter and prescription medicines only as told by your health care provider. °· Follow a healthy meal plan. This can help you reduce the effects of PCOS. °? Eat a healthy diet that includes lean proteins, complex carbohydrates, fresh fruits and vegetables, low-fat dairy products, and healthy fats. Make sure to eat enough fiber. °· If you are overweight, lose weight as told by your health care provider. °? Losing 10% of your body weight may improve symptoms. °? Your health care provider can determine how much weight loss is best for you and can help you lose weight safely. °· Keep all follow-up visits as told by   your health care provider. This is important. Contact a health care provider if:  Your symptoms do not get better with medicine.  You develop new symptoms. This information is not intended to replace advice given to you by your health care provider. Make sure you discuss any questions you have with your health care  provider. Document Released: 04/22/2004 Document Revised: 08/25/2015 Document Reviewed: 06/14/2015 Elsevier Interactive Patient Education  2018 [employer].    Nausea, Adult Feeling sick to your stomach (nausea) means that your stomach is upset or you feel like you have to throw up (vomit). Feeling sick to your stomach is usually not serious, but it may be an early sign of a more serious medical problem. As you feel sicker to your stomach, it can lead to throwing up (vomiting). If you throw up, or if you are not able to drink enough fluids, there is a risk of dehydration. Dehydration can make you feel tired and thirsty, have a dry mouth, and pee (urinate) less often. Older adults and people who have other diseases or a weak defense (immune) system have a higher risk of dehydration. The main goal of treating this condition is to:  Limit how often you feel sick to your stomach.  Prevent throwing up and dehydration.  Follow these instructions at home: Follow instructions from your doctor about how to care for yourself at home. Eating and drinking Follow these recommendations as told by your doctor:  Take an oral rehydration solution (ORS). This is a drink that is sold at pharmacies and stores.  Drink clear fluids in small amounts as you are able, such as: ? Water. ? Ice chips. ? Fruit juice that has water added (diluted fruit juice). ? Low-calorie sports drinks.  Eat bland, easy to digest foods in small amounts as you are able, such as: ? Bananas. ? Applesauce. ? Rice. ? Lean meats. ? Toast. ? Crackers.  Avoid drinking fluids that contain a lot of sugar or caffeine.  Avoid alcohol.  Avoid spicy or fatty foods.  General instructions  Drink enough fluid to keep your pee (urine) clear or pale yellow.  Wash your hands often. If you cannot use soap and water, use hand sanitizer.  Make sure that all people in your household wash their hands well and often.  Rest at home  while you get better.  Take over-the-counter and prescription medicines only as told by your doctor.  Breathe slowly and deeply when you feel sick to your stomach.  Watch your condition for any changes.  Keep all follow-up visits as told by your doctor. This is important. Contact a doctor if:  You have a headache.  You have new symptoms.  You feel sicker to your stomach.  You have a fever.  You feel light-headed or dizzy.  You throw up.  You are not able to keep fluids down. Get help right away if:  You have pain in your chest, neck, arm, or jaw.  You feel very weak or you pass out (faint).  You have throw up that is bright red or looks like coffee grounds.  You have bloody or black poop (stools), or poop that looks like tar.  You have a very bad headache, a stiff neck, or both.  You have very bad pain, cramping, or bloating in your belly.  You have a rash.  You have trouble breathing or you are breathing very quickly.  Your heart is beating very quickly.  Your skin feels cold and  clammy.  You feel confused.  You have pain while peeing.  You have signs of dehydration, such as: ? Dark pee, or very little or no pee. ? Cracked lips. ? Dry mouth. ? Sunken eyes. ? Sleepiness. ? Weakness. These symptoms may be an emergency. Do not wait to see if the symptoms will go away. Get medical help right away. Call your local emergency services (911 in the [place] Do not drive yourself to the hospital. This information is not intended to replace advice given to you by your health care provider. Make sure you discuss any questions you have with your health care provider. Document Released: 12/16/2010 Document Revised: 06/04/2015 Document Reviewed: 09/02/2014 Elsevier Interactive Patient Education  [employer]2018 Else[employer] Inc[employer]

## 2017-08-03 NOTE — MAU Provider Note (Signed)
Chief Complaint: Amenorrhea and Nausea   First Provider Initiated Contact with Patient 08/03/17 1404     SUBJECTIVE HPI: Erika Townsend is a 27 y.o. [id number] nonpregnant female who presents to [facility] reporting menstrual issues and nausea. Symptoms have been ongoing for over a month. Has not been to PCP for symptoms but came here today because "she was in town & figured she would stop by since we have her records".  Has not vomited today. Has not taken medication for her symptoms.  LMP was in May. Has not had positive pregnancy tests at home. Endorses intermittent pink spotting for the last month. No vaginal discharge. No fever/chills, diarrhea, heartburn.   Denies hx of irregular periods. Denies contraception.   Location: lower abdomen Quality: cramp & pressure Severity: 8/10 on pain scale Duration: 1+ month Timing: intermittent Modifying factors: none, hasn't treated symptoms Associated signs and symptoms: amenorrhea & nausea  Past Medical History:  Diagnosis Date  . Abnormal glucose tolerance in pregnancy 03/23/2014  . Asherman syndrome   . Diabetes mellitus without complication (HCC)    pt states has problems with BS, but not diabetic  . Endometritis following delivery 12/03/2012  . Gestational diabetes   . Incompetence of cervix   . PCOS (polycystic ovarian syndrome)   . S/P cesarean section 04/21/2014   OB History  Gravida Para Term Preterm AB Living  3 1 1   2 1   SAB TAB Ectopic Multiple Live Births  2     0 1    # Outcome Date GA Lbr Len/2nd Weight Sex Delivery Anes PTL Lv  3 Term 04/21/14 332w0d  6 lb 7.7 oz (2.94 kg) F CS-LTranv Spinal  LIV     Birth Comments: normal physical exam  2 SAB 12/01/12 5237w5d 16:57 9.9 oz (0.281 kg) F  None  FD  1 SAB 01/26/12 4226w0d      Y      Complications: Incompetent cervix   Past Surgical History:  Procedure Laterality Date  . CERVICAL CERCLAGE N/A 11/10/2012   Procedure: CERCLAGE CERVICAL;  Surgeon: Oliver PilaKathy W Richardson,  MD;  Location: [facility];  Service: Gynecology;  Laterality: N/A;  . CERVICAL CERCLAGE N/A 12/01/2012   Procedure: CERCLAGE CERVICAL REMOVAL;  Surgeon: Sherron MondayJody [name], MD;  Location: [facility];  Service: Gynecology;  Laterality: N/A;  . CESAREAN SECTION N/A 04/21/2014   Procedure: CESAREAN SECTION;  Surgeon: Sherian ReinJody [name]-Stuckert, MD;  Location: [facility];  Service: Obstetrics;  Laterality: N/A;  [facility]  . DILATION AND CURETTAGE OF UTERUS  01/26/2012   following a miscarriage  . DILATION AND EVACUATION N/A 12/01/2012   Procedure: DILATATION AND EVACUATION;  Surgeon: Sherron MondayJody [name], MD;  Location: [facility];  Service: Gynecology;  Laterality: N/A;  . DILATION AND EVACUATION N/A 12/03/2012   Procedure: DILATATION AND EVACUATION;  Surgeon: Sherron MondayJody [name], MD;  Location: [facility];  Service: Gynecology;  Laterality: N/A;  . HYSTEROSCOPY W/D&C N/A 02/06/2013   Procedure:  SUCTION D&C HYSTEROSCOPY/LYSIS OF ADHESIONS/ INTRAOP HSG COOK UTERINE STENT PLACEMENT;  Surgeon: Fermin Schwabamer Yalcinkaya, MD;  Location: [facility];  Service: Gynecology;  Laterality: N/A;   Social History   Socioeconomic History  . Marital status: Single    Spouse name: Not on file  . Number of children: Not on file  . Years of education: Not on file  . Highest education level: Not on file  Occupational History  . Not on file  Social Needs  . Financial resource strain:  Not on file  . Food insecurity:    Worry: Not on file    Inability: Not on file  . Transportation needs:    Medical: Not on file    Non-medical: Not on file  Tobacco Use  . Smoking status: Never Smoker  . Smokeless tobacco: Never Used  Substance and Sexual Activity  . Alcohol use: No  . Drug use: No  . Sexual activity: Yes    Birth control/protection: None    Comment: last intercourse 09/15/2012  Lifestyle  . Physical activity:    Days per week: Not on file    Minutes per session: Not on file  . Stress: Not on file  Relationships   . Social connections:    Talks on phone: Not on file    Gets together: Not on file    Attends religious service: Not on file    Active member of club or organization: Not on file    Attends meetings of clubs or organizations: Not on file    Relationship status: Not on file  . Intimate partner violence:    Fear of current or ex partner: Not on file    Emotionally abused: Not on file    Physically abused: Not on file    Forced sexual activity: Not on file  Other Topics Concern  . Not on file  Social History Narrative  . Not on file   Family History  Problem Relation Age of Onset  . Stroke Maternal Grandmother   . Heart disease Maternal Grandmother   . Cancer Maternal Grandfather    No current facility-administered medications on file prior to encounter.    Current Outpatient Medications on File Prior to Encounter  Medication Sig Dispense Refill  . acetaminophen (TYLENOL) 500 MG tablet Take 1,000 mg by mouth every 6 (six) hours as needed for mild pain, moderate pain or headache.    . benzonatate (TESSALON) 100 MG capsule Take 1 capsule (100 mg total) by mouth every 8 (eight) hours. 21 capsule 0  . Guaifenesin 1200 MG TB12 Take 1 tablet (1,200 mg total) by mouth 2 (two) times daily at 10 AM and 5 PM. 14 each 0  . ibuprofen (ADVIL,MOTRIN) 800 MG tablet Take 800 mg by mouth 3 (three) times daily as needed.    . loratadine (CLARITIN) 10 MG tablet Take 10 mg by mouth daily.    Marland Kitchen tetrahydrozoline-zinc (VISINE-AC) 0.05-0.25 % ophthalmic solution Place 2 drops into both eyes 3 (three) times daily as needed (for allergies).     Allergies  Allergen Reactions  . Cinnamon Shortness Of Breath  . Penicillins Shortness Of Breath and Swelling    Has patient had a PCN reaction causing immediate rash, facial/tongue/throat swelling, SOB or lightheadedness with hypotension: Yes Has patient had a PCN reaction causing severe rash involving mucus membranes or skin necrosis: No Has patient had a PCN  reaction that required hospitalization: Yes Has patient had a PCN reaction occurring within the last 10 years: No If all of the above answers are "NO", then may proceed with Cephalosporin use.   . Diphenhydramine Hives  . Flagyl [Metronidazole] Itching and Other (See Comments)    Reaction:  Bumps in the mouth   . Tape Rash    I have reviewed patient's Past Medical Hx, Surgical Hx, Family Hx, Social Hx, medications and allergies.   Review of Systems  Constitutional: Negative.   Gastrointestinal: Positive for abdominal pain and nausea. Negative for diarrhea and vomiting.  Genitourinary: Positive for  menstrual problem. Negative for dyspareunia, dysuria and vaginal discharge.    OBJECTIVE Patient Vitals for the past 24 hrs:  BP Temp Temp src Pulse Resp Height Weight  08/03/17 1519 (!) 143/99 - - - - - -  08/03/17 1258 139/90 98.6 F (37 C) Oral 88 18 5' 3.5" (1.613 m) 268 lb 1.9 oz (121.6 kg)   Constitutional: Well-developed, well-nourished female in no acute distress.  Cardiovascular: normal rate & rhythm, no murmur Respiratory: normal rate and effort. Lung sounds clear throughout GI: Abd soft, non-tender, Pos BS x 4. No guarding or rebound tenderness MS: Extremities nontender, no edema, normal ROM Neurologic: Alert and oriented x 4.    LAB RESULTS Results for orders placed or performed during the hospital encounter of 08/03/17 (from the past 24 hour(s))  Urinalysis, Routine w reflex microscopic     Status: None   Collection Time: 08/03/17  1:32 PM  Result Value Ref Range   Color, Urine YELLOW YELLOW   APPearance CLEAR CLEAR   Specific Gravity, Urine 1.020 1.005 - 1.030   pH 6.0 5.0 - 8.0   Glucose, UA NEGATIVE NEGATIVE mg/dL   Hgb urine dipstick NEGATIVE NEGATIVE   Bilirubin Urine NEGATIVE NEGATIVE   Ketones, ur NEGATIVE NEGATIVE mg/dL   Protein, ur NEGATIVE NEGATIVE mg/dL   Nitrite NEGATIVE NEGATIVE   Leukocytes, UA NEGATIVE NEGATIVE  Pregnancy, urine POC     Status:  None   Collection Time: 08/03/17  1:45 PM  Result Value Ref Range   Preg Test, Ur NEGATIVE NEGATIVE    IMAGING No results found.  MAU COURSE Orders Placed This Encounter  Procedures  . Urinalysis, Routine w reflex microscopic  . Pregnancy, urine POC  . Discharge patient   Meds ordered this encounter  Medications  . ondansetron (ZOFRAN-ODT) disintegrating tablet 8 mg  . ondansetron (ZOFRAN ODT) 4 MG disintegrating tablet    Sig: Take 1 tablet (4 mg total) by mouth every 8 (eight) hours as needed for nausea or vomiting.    Dispense:  15 tablet    Refill:  0    Order Specific Question:   Supervising Provider    Answer:   Howard Bing [id number]  . metFORMIN (GLUCOPHAGE) 500 MG tablet    Sig: Take 1 tablet (500 mg total) by mouth 2 (two) times daily with a meal.    Dispense:  60 tablet    Refill:  0    Order Specific Question:   Supervising Provider    Answer:   Nobleton Bing [[id number]]    MDM Negative UPT Pt declines pelvic exam or vaginal swabs Zofran 8 mg ODT given  Pt poor historian. Initially reports that she's never been on birth control & has always had regular periods & doesn't have a PCP. Per Care Everywhere, pt's PCP is Dr[facility] Sydnee [facility]al at [facility]; she had an IUD until last summer, then was started on the pill but did not continue them. Was seen in September by PCP for amenorrhea & diagnosed with PCOS. After discussing these visits with patient, she reports that she saw Dr. April Manson as recently as October & was put on Metformin for her PCOS but ran out of it & hasn't been able to schedule f/u with him. Current symptoms started after she ran out of the metformin.  I will refill her Metformin until she can f/u with April Manson -- states he doesn't have appts available until next month. Also strongly encouraged patient to f/u with [facility] or find PCP more local to  her.   ASSESSMENT 1. Non-pregnancy nausea   2. Pregnancy examination or test, negative result   3. PCOS  (polycystic ovarian syndrome)     PLAN Discharge home in stable condition.  Follow-up Information    Lovena Neighbours, MD Follow up.   Specialty:  Family Medicine Contact information: [address] [place] [address] [phone]        Fermin Schwab, MD Follow up.   Specialty:  Obstetrics and Gynecology Contact information: [address] [place]. [place] [address] [phone]          Allergies as of 08/03/2017      Reactions   Cinnamon Shortness Of Breath   Penicillins Shortness Of Breath, Swelling   Has patient had a PCN reaction causing immediate rash, facial/tongue/throat swelling, SOB or lightheadedness with hypotension: Yes Has patient had a PCN reaction causing severe rash involving mucus membranes or skin necrosis: No Has patient had a PCN reaction that required hospitalization: Yes Has patient had a PCN reaction occurring within the last 10 years: No If all of the above answers are "NO", then may proceed with Cephalosporin use.   Diphenhydramine Hives   Flagyl [metronidazole] Itching, Other (See Comments)   Reaction:  Bumps in the mouth    Tape Rash      Medication List    STOP taking these medications   clindamycin 300 MG capsule Commonly known as:  CLEOCIN   norethindrone 0.35 MG tablet Commonly known as:  MICRONOR,CAMILA,ERRIN   ondansetron 4 MG tablet Commonly known as:  ZOFRAN     TAKE these medications   acetaminophen 500 MG tablet Commonly known as:  TYLENOL Take 1,000 mg by mouth every 6 (six) hours as needed for mild pain, moderate pain or headache.   benzonatate 100 MG capsule Commonly known as:  TESSALON Take 1 capsule (100 mg total) by mouth every 8 (eight) hours.   Guaifenesin 1200 MG Tb12 Take 1 tablet (1,200 mg total) by mouth 2 (two) times daily at 10 AM and 5 PM.   ibuprofen 800 MG tablet Commonly known as:  ADVIL,MOTRIN Take 800 mg by mouth 3 (three) times daily as needed.   loratadine 10 MG  tablet Commonly known as:  CLARITIN Take 10 mg by mouth daily.   metFORMIN 500 MG tablet Commonly known as:  GLUCOPHAGE Take 1 tablet (500 mg total) by mouth 2 (two) times daily with a meal.   ondansetron 4 MG disintegrating tablet Commonly known as:  ZOFRAN ODT Take 1 tablet (4 mg total) by mouth every 8 (eight) hours as needed for nausea or vomiting.   tetrahydrozoline-zinc 0.05-0.25 % ophthalmic solution Commonly known as:  VISINE-AC Place 2 drops into both eyes 3 (three) times daily as needed (for allergies).        Judeth Horn, NP 08/03/2017  3:21 PM

## 2017-08-03 NOTE — MAU Note (Signed)
Pt hasn't had a period since May, spotted in July - pinkish/brown.  Had neg HPT in June.  Nausea x 4 weeks, Also lower abd pain for the last 1 1/2 months.  Denies bleeding.

## 2017-08-07 ENCOUNTER — Encounter: Payer: Self-pay | Admitting: Family Medicine

## 2017-08-17 ENCOUNTER — Encounter: Payer: Self-pay | Admitting: Family Medicine

## 2017-08-22 ENCOUNTER — Ambulatory Visit: Payer: Self-pay | Admitting: Family Medicine

## 2017-09-05 ENCOUNTER — Other Ambulatory Visit: Payer: Self-pay | Admitting: Student

## 2018-03-12 ENCOUNTER — Encounter: Payer: Medicaid Other | Admitting: Obstetrics

## 2018-03-12 DIAGNOSIS — Z3480 Encounter for supervision of other normal pregnancy, unspecified trimester: Secondary | ICD-10-CM | POA: Insufficient documentation

## 2019-11-06 ENCOUNTER — Other Ambulatory Visit: Payer: Self-pay | Admitting: *Deleted

## 2019-11-06 DIAGNOSIS — N644 Mastodynia: Secondary | ICD-10-CM

## 2020-08-11 ENCOUNTER — Other Ambulatory Visit: Payer: Self-pay | Admitting: Plastic Surgery

## 2021-06-15 ENCOUNTER — Encounter: Payer: Self-pay | Admitting: *Deleted

## 2022-02-10 ENCOUNTER — Institutional Professional Consult (permissible substitution): Payer: Medicaid Other | Admitting: Medical

## 2022-02-11 ENCOUNTER — Ambulatory Visit: Payer: Medicaid Other | Admitting: Medical

## 2022-02-11 ENCOUNTER — Encounter: Payer: Self-pay | Admitting: Medical

## 2022-02-11 VITALS — BP 110/60 | HR 77 | Ht 64.0 in | Wt 287.0 lb

## 2022-02-11 DIAGNOSIS — I10 Essential (primary) hypertension: Secondary | ICD-10-CM

## 2022-02-11 DIAGNOSIS — R519 Headache, unspecified: Secondary | ICD-10-CM

## 2022-02-11 DIAGNOSIS — Z6841 Body Mass Index (BMI) 40.0 and over, adult: Secondary | ICD-10-CM | POA: Diagnosis not present

## 2022-02-11 DIAGNOSIS — R7303 Prediabetes: Secondary | ICD-10-CM | POA: Diagnosis not present

## 2022-02-11 DIAGNOSIS — R609 Edema, unspecified: Secondary | ICD-10-CM

## 2022-02-11 LAB — POCT URINALYSIS DIP (PROADVANTAGE DEVICE)
Bilirubin, UA: NEGATIVE
Glucose, UA: NEGATIVE mg/dL
Ketones, POC UA: NEGATIVE mg/dL
Nitrite, UA: NEGATIVE
Protein Ur, POC: NEGATIVE mg/dL
Specific Gravity, Urine: 1.015
Urobilinogen, Ur: NEGATIVE
pH, UA: 6.5 (ref 5.0–8.0)

## 2022-02-11 NOTE — Progress Notes (Signed)
Subjective:  Erika Townsend is a 32 y.o. female who presents for Chief Complaint  Patient presents with   new pt    New pt get established, had twins in 2020, having issues with weight and BP, prediabetes.    Here as a new patient today.  Was seeing [facility] in [place], [place] prior.  Just moved back to [place].  Has hx/o HTN.  Been seeing some elevated BPs, been having some headaches.  Having headaches often of late.  Getting headaches daily since December.  Was started on Topamax in December by PCP.  Has had some recent swelling in hands and feet, 2-3 weeks ago.  It was so bad couldn't get up and walk.  Had issues with swelling in pregnancy as well.  The swelling lasted about 3-4 days then resolved.    With her pregnancy had new diagnoses of prediabetes, hypertension, swelling.    Was on ozempic prior to help with weight loss and prediabetes.  Was on ozempic for about 5 months.   Weight is staying the same lately.   Has had some issues with prediabetes since after having twin babies in 2020.    Last labs last year, early in the year.  Has been on strict diet since being on ozempic.    No other aggravating or relieving factors.    She has hx/o tubal ligation  No other c/o.   Past Medical History:  Diagnosis Date   Abnormal glucose tolerance in pregnancy 03/23/2014   Asherman syndrome    Diabetes mellitus without complication ([place])    pt states has problems with BS, but not diabetic   Endometritis following delivery 12/03/2012   Gestational diabetes    Hypertension    Incompetence of cervix    PCOS (polycystic ovarian syndrome)    Prediabetes    S/P cesarean section 04/21/2014   Current Outpatient Medications on File Prior to Visit  Medication Sig Dispense Refill   cyclobenzaprine (FLEXERIL) 10 MG tablet Take 10-20 mg by mouth at bedtime.     topiramate (TOPAMAX) 100 MG tablet Take 100 mg by mouth daily.     valsartan-hydrochlorothiazide (DIOVAN-HCT) 160-12.5 MG tablet  Take 1 tablet by mouth daily.     No current facility-administered medications on file prior to visit.    The following portions of the patient's history were reviewed and updated as appropriate: allergies, current medications, past family history, past medical history, past social history, past surgical history and problem list.  ROS Otherwise as in subjective above   Objective: BP 110/60   Pulse 77   Ht 5\' 4"  (1.626 m)   Wt 287 lb (130.2 kg)   BMI 49.26 kg/m   Wt Readings from Last 3 Encounters:  02/11/22 287 lb (130.2 kg)  08/03/17 268 lb 1.9 oz (121.6 kg)  10/31/16 210 lb (95.3 kg)   BP Readings from Last 3 Encounters:  02/11/22 110/60  08/03/17 (!) 143/99  10/31/16 (!) 158/108    General appearance: alert, no distress, well developed, well nourished, obese African American female Strong marijuana odor HEENT: normocephalic, sclerae anicteric, conjunctiva pink and moist, TMs pearly, nares patent, no discharge or erythema, pharynx normal Oral cavity: MMM, no lesions, no large tonsils Neck: supple, no lymphadenopathy, no thyromegaly, no masses, no bruits no JVD Heart: RRR, normal S1, S2, no murmurs Lungs: CTA bilaterally, no wheezes, rhonchi, or rales Pulses: 2+ radial pulses, 2+ pedal pulses, normal cap refill Ext: no edema Neuro: CN2-12 intact, nonfocal exam  Assessment: Encounter Diagnoses  Name Primary?   Essential hypertension, benign Yes   Frequent headaches    Prediabetes    BMI 45.0-49.9, adult (HCC)    Edema, unspecified type      Plan: Hypertension - continue current medication, continue blood pressure monitoring at home  Prediabetes-updated labs today.  She was on Ozempic before and was doing well with this.  Consider similar medication going forward  Obesity-continue careful diet, exercise regularly, will likely go back on GLP-1 medication pending labs  Frequent headaches-etiology unclear.  I asked her to keep a headache journal.  Labs  today.  Consider sleep apnea testing given obesity and large neck diameter.  Consider other causes of headaches.  Currently on Topamax but not improved.  Consider increasing dose or trying something else.  Recent edema that resolved after a few days-likely related to diet.  Nevertheless labs today to further evaluate   Mende was seen today for new pt.  Diagnoses and all orders for this visit:  Essential hypertension, benign -     Comprehensive metabolic panel -     POCT Urinalysis DIP (Proadvantage Device)  Frequent headaches -     CBC with Differential/Platelet -     Comprehensive metabolic panel -     TSH -     POCT Urinalysis DIP (Proadvantage Device)  Prediabetes -     Hemoglobin A1c -     TSH -     POCT Urinalysis DIP (Proadvantage Device)  BMI 45.0-49.9, adult (HCC) -     TSH  Edema, unspecified type -     Comprehensive metabolic panel -     TSH    Follow up: pending labs

## 2022-02-12 LAB — HEMOGLOBIN A1C
Est. average glucose Bld gHb Est-mCnc: 123 mg/dL
Hgb A1c MFr Bld: 5.9 % — ABNORMAL HIGH (ref 4.8–5.6)

## 2022-02-12 LAB — COMPREHENSIVE METABOLIC PANEL
ALT: 13 IU/L (ref 0–32)
AST: 14 IU/L (ref 0–40)
Albumin/Globulin Ratio: 1.1 — ABNORMAL LOW (ref 1.2–2.2)
Albumin: 3.9 g/dL (ref 3.9–4.9)
Alkaline Phosphatase: 59 IU/L (ref 44–121)
BUN/Creatinine Ratio: 10 (ref 9–23)
BUN: 11 mg/dL (ref 6–20)
Bilirubin Total: 0.4 mg/dL (ref 0.0–1.2)
CO2: 22 mmol/L (ref 20–29)
Calcium: 10 mg/dL (ref 8.7–10.2)
Chloride: 101 mmol/L (ref 96–106)
Creatinine, Ser: 1.06 mg/dL — ABNORMAL HIGH (ref 0.57–1.00)
Globulin, Total: 3.4 g/dL (ref 1.5–4.5)
Glucose: 91 mg/dL (ref 70–99)
Potassium: 4.3 mmol/L (ref 3.5–5.2)
Sodium: 137 mmol/L (ref 134–144)
Total Protein: 7.3 g/dL (ref 6.0–8.5)
eGFR: 71 mL/min/{1.73_m2} (ref >59)

## 2022-02-12 LAB — CBC WITH DIFFERENTIAL/PLATELET
Basophils Absolute: 0 10*3/uL (ref 0.0–0.2)
Basos: 0 %
EOS (ABSOLUTE): 0.1 10*3/uL (ref 0.0–0.4)
Eos: 1 %
Hematocrit: 42.6 % (ref 34.0–46.6)
Hemoglobin: 13.9 g/dL (ref 11.1–15.9)
Immature Grans (Abs): 0 10*3/uL (ref 0.0–0.1)
Immature Granulocytes: 0 %
Lymphocytes Absolute: 3.7 10*3/uL — ABNORMAL HIGH (ref 0.7–3.1)
Lymphs: 38 %
MCH: 27.4 pg (ref 26.6–33.0)
MCHC: 32.6 g/dL (ref 31.5–35.7)
MCV: 84 fL (ref 79–97)
Monocytes Absolute: 0.6 10*3/uL (ref 0.1–0.9)
Monocytes: 6 %
Neutrophils Absolute: 5.4 10*3/uL (ref 1.4–7.0)
Neutrophils: 55 %
Platelets: 276 10*3/uL (ref 150–450)
RBC: 5.07 x10E6/uL (ref 3.77–5.28)
RDW: 13.2 % (ref 11.7–15.4)
WBC: 10 10*3/uL (ref 3.4–10.8)

## 2022-02-12 LAB — TSH: TSH: 1.02 u[IU]/mL (ref 0.450–4.500)

## 2022-02-13 ENCOUNTER — Other Ambulatory Visit: Payer: Self-pay | Admitting: Medical

## 2022-02-13 MED ORDER — WEGOVY 0.5 MG/0.5ML ~~LOC~~ SOAJ: 0.5000 mg | SUBCUTANEOUS | 0 refills | Status: DC

## 2022-02-13 MED ORDER — WEGOVY 0.25 MG/0.5ML ~~LOC~~ SOAJ: 0.2500 mg | SUBCUTANEOUS | 0 refills | Status: DC

## 2022-02-13 NOTE — Progress Notes (Signed)
Labs show normal blood counts, other than some minor lymphocyte elevation that is likely not anything worrisome, liver test normal, electrolytes normal, diabetes marker 5.9% thankfully, thyroid numbers are okay.  Kidney marker is slightly elevated or abnormal.  I recommend you begin Wegovy weight loss medication.  This is similar to [place] but indicated for nondiabetics.  If this is not covered by insurance call your insurance to see if Zepbound alternative is  covered by insurance.  I recommend you get some type of exercise such as 150 minutes or more of aerobic exercise weekly  Continue Topamax and valsartan HCT for now.  Keep a headache journal.  Use some type of diet plan such as Whole 30 4 weight watchers or [facility] eating plan or other  Please plan to follow-up in 6 weeks after starting [facility]

## 2022-02-14 ENCOUNTER — Other Ambulatory Visit: Payer: Self-pay | Admitting: Medical

## 2022-02-14 MED ORDER — ZEPBOUND 2.5 MG/0.5ML ~~LOC~~ SOAJ: 2.5000 mg | SUBCUTANEOUS | 0 refills | Status: DC

## 2022-02-14 MED ORDER — ZEPBOUND 5 MG/0.5ML ~~LOC~~ SOAJ: 5.0000 mg | SUBCUTANEOUS | 0 refills | Status: DC

## 2022-03-01 ENCOUNTER — Telehealth: Payer: Self-pay | Admitting: Medical

## 2022-03-01 NOTE — Telephone Encounter (Signed)
Erika Townsend needs a PA for Zepbound and asks for you to call her when available.

## 2022-03-14 ENCOUNTER — Other Ambulatory Visit: Payer: Self-pay | Admitting: Medical

## 2022-03-14 MED ORDER — OZEMPIC (0.25 OR 0.5 MG/DOSE) 2 MG/3ML ~~LOC~~ SOPN: 0.2500 mg | PEN_INJECTOR | SUBCUTANEOUS | 0 refills | Status: DC

## 2022-03-29 NOTE — Telephone Encounter (Signed)
Pt was switched to Ozempic

## 2022-04-13 ENCOUNTER — Telehealth: Payer: Self-pay

## 2022-04-13 NOTE — Telephone Encounter (Signed)
Pt called to request referral to neurologist to address her migraines or for an MRI. She stated she didn't want to keep taking medicine that isn't resolving the problem. She had her former PCP send records to [place] but it looks like we haven't received them yet.

## 2022-04-13 NOTE — Telephone Encounter (Signed)
Appt scheduled for 04/14/22

## 2022-04-14 ENCOUNTER — Telehealth: Payer: Self-pay | Admitting: *Deleted

## 2022-04-14 ENCOUNTER — Ambulatory Visit: Payer: Medicaid Other | Admitting: Medical

## 2022-04-14 VITALS — BP 110/80 | HR 73 | Wt 296.8 lb

## 2022-04-14 DIAGNOSIS — E559 Vitamin D deficiency, unspecified: Secondary | ICD-10-CM

## 2022-04-14 DIAGNOSIS — R519 Headache, unspecified: Secondary | ICD-10-CM

## 2022-04-14 DIAGNOSIS — E538 Deficiency of other specified B group vitamins: Secondary | ICD-10-CM

## 2022-04-14 DIAGNOSIS — I10 Essential (primary) hypertension: Secondary | ICD-10-CM | POA: Diagnosis not present

## 2022-04-14 DIAGNOSIS — G43509 Persistent migraine aura without cerebral infarction, not intractable, without status migrainosus: Secondary | ICD-10-CM | POA: Diagnosis not present

## 2022-04-14 DIAGNOSIS — Z6841 Body Mass Index (BMI) 40.0 and over, adult: Secondary | ICD-10-CM

## 2022-04-14 DIAGNOSIS — E1165 Type 2 diabetes mellitus with hyperglycemia: Secondary | ICD-10-CM

## 2022-04-14 DIAGNOSIS — R110 Nausea: Secondary | ICD-10-CM

## 2022-04-14 MED ORDER — SEMAGLUTIDE (2 MG/DOSE) 8 MG/3ML ~~LOC~~ SOPN: 2.0000 mg | PEN_INJECTOR | SUBCUTANEOUS | 2 refills | Status: DC

## 2022-04-14 MED ORDER — EMGALITY 120 MG/ML ~~LOC~~ SOAJ: 120.0000 mg | SUBCUTANEOUS | 3 refills | Status: DC

## 2022-04-14 NOTE — Telephone Encounter (Signed)
PA for Emgality denied.

## 2022-04-14 NOTE — Progress Notes (Signed)
Subjective:  Erika Townsend is a 31 y.o. female who presents for Chief Complaint  Patient presents with   med check    Med check- ozempic is making her to gain weight and not lose weight.  Would like a neurology referral for headaches     Here for med check.   Here for recheck on obesity, weight loss efforts.  So far not seeing improvement on ozempic.    Exercising 4 days per week.   Working with a Physiological scientist, aerobic and weight bearing exercise.  Using a specific calorie counting program.  She notes hx/o chronic headaches since high school.   Headaches can last a few days, longest ever 1 week. Sometimes just in middle, sometimes on side of head.  Gets photophobia, phonophobia, throbbing headaches, pulsating.  Sometimes vision is affected such as stars or dots of blurry.   Can get nauseated with headaches.   Usually has to lie down in dark quite room.   Has tried various medicaiton but can't recall names.  Has tried Topamax, muscle relaxer, one drug that started with a "p".     Would prefer shot to a daily pill.  No other aggravating or relieving factors.    No other c/o.  Past Medical History:  Diagnosis Date   Abnormal glucose tolerance in pregnancy 03/23/2014   Erika Townsend syndrome    Diabetes mellitus without complication ([place])    pt states has problems with BS, but not diabetic   Endometritis following delivery 12/03/2012   Gestational diabetes    Hypertension    Incompetence of cervix    PCOS (polycystic ovarian syndrome)    Prediabetes    S/P cesarean section 04/21/2014   Current Outpatient Medications on File Prior to Visit  Medication Sig Dispense Refill   cyclobenzaprine (FLEXERIL) 10 MG tablet Take 10-20 mg by mouth at bedtime.     Semaglutide,0.25 or 0.5MG /DOS, (OZEMPIC, 0.25 OR 0.5 MG/DOSE,) 2 MG/3ML SOPN Inject 0.25 mg into the skin once a week. 3 mL 0   topiramate (TOPAMAX) 100 MG tablet Take 100 mg by mouth daily.     valsartan-hydrochlorothiazide  (DIOVAN-HCT) 160-12.5 MG tablet Take 1 tablet by mouth daily.     No current facility-administered medications on file prior to visit.   The following portions of the patient's history were reviewed and updated as appropriate: allergies, current medications, past family history, past medical history, past social history, past surgical history and problem list.  ROS Otherwise as in subjective above   Objective: BP 110/80   Pulse 73   Wt 296 lb 12.8 oz (134.6 kg)   BMI 50.95 kg/m   Wt Readings from Last 3 Encounters:  04/14/22 296 lb 12.8 oz (134.6 kg)  02/11/22 287 lb (130.2 kg)  08/03/17 268 lb 1.9 oz (121.6 kg)   General appearance: alert, no distress, well developed, well nourished Neck: supple, no lymphadenopathy, no thyromegaly, no masses Pulses: 2+ radial pulses, 2+ pedal pulses, normal cap refill Ext: no edema Neuro: cn2-12 intact, nonfocal exam    Assessment: Encounter Diagnoses  Name Primary?   Persistent migraine aura without cerebral infarction and without status migrainosus, not intractable Yes   BMI 50.0-59.9, adult    Essential hypertension, benign    Frequent headaches    Type 2 diabetes mellitus with hyperglycemia, unspecified whether long term insulin use    Nausea    B12 deficiency    Vitamin D deficiency      Plan: Migraine headache with aura I placed  an order for MRI brain.  Expect a phone call about scheduling the MRI Begin new medication called Emgality.  We did the first injection today in the office.  You will use 120 mg monthly injection in 1 month You can continue Zofran as needed for nausea Lets wean off Topamax.  Use Topamax every other day for the next week and then stop this medication   History of vitamin D and B12 deficiency  I am checking your B12 and vitamin D levels today  Obesity, diabetes Increased Ozempic 2 mg weekly injection Continue efforts with healthy eating habits and exercise  Hypertension Continue valsartan HCT  160/12 mg daily   [name] was seen today for med check.  Diagnoses and all orders for this visit:  Persistent migraine aura without cerebral infarction and without status migrainosus, not intractable -     MR Brain Wo Contrast; Future  BMI 50.0-59.9, adult  Essential hypertension, benign  Frequent headaches  Type 2 diabetes mellitus with hyperglycemia, unspecified whether long term insulin use  Nausea  B12 deficiency -     Vitamin B12  Vitamin D deficiency -     VITAMIN D 25 Hydroxy (Vit-D Deficiency, Fractures)  Other orders -     Semaglutide, 2 MG/DOSE, 8 MG/3ML SOPN; Inject 2 mg as directed once a week. -     Galcanezumab-gnlm (EMGALITY) 120 MG/ML SOAJ; Inject 120 mg into the skin every 30 (thirty) days.    Follow up: 91mo

## 2022-04-14 NOTE — Patient Instructions (Signed)
Migraine headache with aura I placed an order for MRI brain.  Expect a phone call about scheduling the MRI Begin new medication called Emgality.  We did the first injection today in the office.  You will use 120 mg monthly injection in 1 month You can continue Zofran as needed for nausea Lets wean off Topamax.  Use Topamax every other day for the next week and then stop this medication   History of vitamin D and B12 deficiency  I am checking your B12 and vitamin D levels today  Obesity, diabetes Increased Ozempic 2 mg weekly injection Continue efforts with healthy eating habits and exercise  Hypertension Continue valsartan HCT 160/12 mg daily

## 2022-04-15 ENCOUNTER — Other Ambulatory Visit: Payer: Self-pay | Admitting: Medical

## 2022-04-15 LAB — VITAMIN B12: Vitamin B-12: 207 pg/mL — ABNORMAL LOW (ref 232–1245)

## 2022-04-15 LAB — VITAMIN D 25 HYDROXY (VIT D DEFICIENCY, FRACTURES): Vit D, 25-Hydroxy: 7.2 ng/mL — ABNORMAL LOW (ref 30.0–100.0)

## 2022-04-15 MED ORDER — VITAMIN D (ERGOCALCIFEROL) 1.25 MG (50000 UNIT) PO CAPS: 50000.0000 [IU] | ORAL_CAPSULE | ORAL | 0 refills | Status: DC

## 2022-04-15 MED ORDER — VITAMIN B-12 1000 MCG PO TABS: 1000.0000 ug | ORAL_TABLET | Freq: Every day | ORAL | 0 refills | Status: AC

## 2022-04-15 NOTE — Progress Notes (Signed)
Results sent through MyChart

## 2022-04-15 NOTE — Telephone Encounter (Signed)
-  Emgality has been denied- having Medicat-ion over- use headache is not advised to use with headache. You can do a peer to peer review by calling [phone]  Regarding member [id number]

## 2022-04-18 NOTE — Telephone Encounter (Signed)
Deny due to no pregnancy test. Pt will come in next week and then we can renew it again

## 2022-04-18 NOTE — Telephone Encounter (Signed)
Renewing this to see if we can get this approved

## 2022-04-21 ENCOUNTER — Other Ambulatory Visit (HOSPITAL_COMMUNITY): Payer: Medicaid Other

## 2022-04-25 ENCOUNTER — Other Ambulatory Visit: Payer: Medicaid Other

## 2022-04-29 ENCOUNTER — Other Ambulatory Visit: Payer: Medicaid Other

## 2022-05-01 ENCOUNTER — Ambulatory Visit
Admission: RE | Admit: 2022-05-01 | Discharge: 2022-05-01 | Disposition: A | Discharge status: Home / Self Care | Payer: Medicaid Other | Source: Ambulatory Visit | Attending: Medical | Admitting: Medical

## 2022-05-01 DIAGNOSIS — G43509 Persistent migraine aura without cerebral infarction, not intractable, without status migrainosus: Secondary | ICD-10-CM

## 2022-05-18 ENCOUNTER — Ambulatory Visit
Admission: RE | Admit: 2022-05-18 | Discharge: 2022-05-18 | Disposition: A | Discharge status: Home / Self Care | Payer: Medicaid Other | Source: Ambulatory Visit | Attending: Medical | Admitting: Medical

## 2022-05-19 NOTE — Progress Notes (Signed)
Make sure she is on the schedule soon to recheck on weight and to discuss MRI findings.

## 2022-05-20 ENCOUNTER — Encounter: Payer: Self-pay | Admitting: Medical

## 2022-05-20 ENCOUNTER — Telehealth (INDEPENDENT_AMBULATORY_CARE_PROVIDER_SITE_OTHER): Payer: Medicaid Other | Admitting: Medical

## 2022-05-20 VITALS — BP 116/80 | Temp 98.0°F | Wt 287.5 lb

## 2022-05-20 DIAGNOSIS — I10 Essential (primary) hypertension: Secondary | ICD-10-CM

## 2022-05-20 DIAGNOSIS — E1165 Type 2 diabetes mellitus with hyperglycemia: Secondary | ICD-10-CM | POA: Insufficient documentation

## 2022-05-20 DIAGNOSIS — R9089 Other abnormal findings on diagnostic imaging of central nervous system: Secondary | ICD-10-CM | POA: Diagnosis not present

## 2022-05-20 DIAGNOSIS — Z7985 Long-term (current) use of injectable non-insulin antidiabetic drugs: Secondary | ICD-10-CM

## 2022-05-20 DIAGNOSIS — Z9851 Tubal ligation status: Secondary | ICD-10-CM | POA: Insufficient documentation

## 2022-05-20 DIAGNOSIS — R519 Headache, unspecified: Secondary | ICD-10-CM

## 2022-05-20 MED ORDER — ONDANSETRON HCL 4 MG PO TABS: 4.0000 mg | ORAL_TABLET | Freq: Three times a day (TID) | ORAL | 1 refills | Status: DC | PRN

## 2022-05-20 MED ORDER — VALSARTAN-HYDROCHLOROTHIAZIDE 160-12.5 MG PO TABS: 1.0000 | ORAL_TABLET | Freq: Every day | ORAL | 2 refills | Status: DC

## 2022-05-20 MED ORDER — EMGALITY 120 MG/ML ~~LOC~~ SOAJ: 120.0000 mg | SUBCUTANEOUS | 0 refills | Status: DC

## 2022-05-20 NOTE — Progress Notes (Signed)
Subjective:  Erika Townsend is a 32 y.o. female who presents for Chief Complaint  Patient presents with   Discuss MRI results and weight    Discuss MRI results and weight     This visit type was conducted due to national recommendations for restrictions regarding the COVID-19 Pandemic (e.g. social distancing) in an effort to limit this patient's exposure and mitigate transmission in our community.  Due to their co-morbid illnesses, this patient is at least at moderate risk for complications without adequate follow up.  This format is felt to be most appropriate for this patient at this time.    Documentation for virtual audio and video telecommunications through [place] encounter:  The patient was located at home. The provider was located in the office. The patient did consent to this visit and is aware of possible charges through their insurance for this visit.  The other persons participating in this telemedicine service were none. Time spent on call was 25 minutes and in review of previous records >25 minutes total.  This virtual service is not related to other E/M service within previous 7 days.   Concerns: Virtual consult today for recheck.  I saw her on April 14, 2022 for multiple concerns.  She has a history of chronic headaches.  Here to discuss recent MRI brain.  She also has begun Emgality from last visit and just had her second dose this week.  She notes that she just had a migraine that started this week but has not noticed migraine since last visit.  On the other hand when asked she does not feel like she has had improvements overall in her headaches just yet on the Emgality.    She notes hx/o chronic headaches since high school.   Headaches can last a few days, longest ever 1 week. Sometimes just in middle, sometimes on side of head.  Gets photophobia, phonophobia, throbbing headaches, pulsating.  Sometimes vision is affected such as stars or dots of blurry.   Can get  nauseated with headaches.   Usually has to lie down in dark quite room.   Has tried various medicaiton but can't recall names.  Has tried Topamax, muscle relaxer, one drug that started with a "p".    She has been using semaglutide for weight loss.  She feels like that is going okay so far and has lost some weight.  She is lost about 10 pounds since last visit.  She is exercising 4 days per week.   Working with a Systems analyst, aerobic and weight bearing exercise.  Using a specific calorie counting program.  Of note she has a history of tubal ligation  No other aggravating or relieving factors.    No other c/o.  Past Medical History:  Diagnosis Date   Abnormal glucose tolerance in pregnancy 03/23/2014   Asherman syndrome    Diabetes mellitus without complication (HCC)    pt states has problems with BS, but not diabetic   Endometritis following delivery 12/03/2012   Gestational diabetes    Hypertension    Incompetence of cervix    PCOS (polycystic ovarian syndrome)    Prediabetes    S/P cesarean section 04/21/2014   Current Outpatient Medications on File Prior to Visit  Medication Sig Dispense Refill   Semaglutide, 2 MG/DOSE, 8 MG/3ML SOPN Inject 2 mg as directed once a week. 3 mL 2   Vitamin D, Ergocalciferol, (DRISDOL) 1.25 MG (50000 UNIT) CAPS capsule Take 1 capsule (50,000 Units total) by mouth every  7 (seven) days. 12 capsule 0   cyanocobalamin (VITAMIN B12) 1000 MCG tablet Take 1 tablet (1,000 mcg total) by mouth daily. (Patient not taking: Reported on 05/20/2022) 90 tablet 0   No current facility-administered medications on file prior to visit.   The following portions of the patient's history were reviewed and updated as appropriate: allergies, current medications, past family history, past medical history, past social history, past surgical history and problem list.  ROS Otherwise as in subjective above   Objective: BP 116/80   Temp 98 F (36.7 C)   Wt 287 lb 8 oz  (130.4 kg)   BMI 49.35 kg/m   Wt Readings from Last 3 Encounters:  05/20/22 287 lb 8 oz (130.4 kg)  04/14/22 296 lb 12.8 oz (134.6 kg)  02/11/22 287 lb (130.2 kg)   General appearance: alert, no distress, well developed, well nourished   Brain MRI 05/18/22  IMPRESSION: 1. Partially empty sella turcica. This finding can reflect incidental anatomic variation, or alternatively, it can be associated with idiopathic intracranial hypertension (pseudotumor cerebri). 2. Otherwise unremarkable non-contrast MRI appearance of the brain. 3. Large right maxillary sinus mucous retention cyst.    Assessment: Encounter Diagnoses  Name Primary?   Frequent headaches Yes   Abnormal brain MRI    Essential hypertension, benign    S/P tubal ligation    Type 2 diabetes mellitus with hyperglycemia, unspecified whether long term insulin use (HCC)      Plan: Migraine headache with aura I reviewed her recent MRI brain results as noted above Referral to neurology for further discussion and evaluation and treatment You can continue Zofran as needed for nausea She has weaned off Topamax.  She has tried Emgality x 2 doses.  So far she does not notice a huge improvement and she had some issues with it in regards to side effects.  Obesity, diabetes Continue on Ozempic 2 mg weekly injection Continue efforts with healthy eating habits and exercise  Hypertension Continue valsartan HCT 160/12 mg daily Consider beta-blocker as possible alternative treatment to help with blood pressure and headaches   Erika Townsend was seen today for discuss mri results and weight.  Diagnoses and all orders for this visit:  Frequent headaches -     Ambulatory referral to Neurology  Abnormal brain MRI -     Ambulatory referral to Neurology  Essential hypertension, benign  S/P tubal ligation  Type 2 diabetes mellitus with hyperglycemia, unspecified whether long term insulin use (HCC)  Other orders -     ondansetron  (ZOFRAN) 4 MG tablet; Take 1 tablet (4 mg total) by mouth every 8 (eight) hours as needed for nausea or vomiting. -     valsartan-hydrochlorothiazide (DIOVAN-HCT) 160-12.5 MG tablet; Take 1 tablet by mouth daily. -     Discontinue: Galcanezumab-gnlm (EMGALITY) 120 MG/ML SOAJ; Inject 120 mg into the skin every 30 (thirty) days.   Follow up: 6-8 weeks

## 2022-05-23 ENCOUNTER — Encounter: Payer: Self-pay | Admitting: Neurology

## 2022-06-03 ENCOUNTER — Telehealth: Payer: Self-pay | Admitting: Medical

## 2022-06-03 NOTE — Telephone Encounter (Signed)
P.A. Ronnell [place] approved til 09/01/22 sent mychart message

## 2022-06-16 ENCOUNTER — Ambulatory Visit (INDEPENDENT_AMBULATORY_CARE_PROVIDER_SITE_OTHER): Payer: Medicaid Other | Admitting: Medical

## 2022-06-16 VITALS — BP 120/80 | HR 74 | Temp 97.6°F | Wt 282.8 lb

## 2022-06-16 DIAGNOSIS — M25461 Effusion, right knee: Secondary | ICD-10-CM

## 2022-06-16 DIAGNOSIS — E1165 Type 2 diabetes mellitus with hyperglycemia: Secondary | ICD-10-CM

## 2022-06-16 DIAGNOSIS — R630 Anorexia: Secondary | ICD-10-CM

## 2022-06-16 DIAGNOSIS — M25561 Pain in right knee: Secondary | ICD-10-CM

## 2022-06-16 MED ORDER — SEMAGLUTIDE (1 MG/DOSE) 4 MG/3ML ~~LOC~~ SOPN: 1.0000 mg | PEN_INJECTOR | SUBCUTANEOUS | 1 refills | Status: DC

## 2022-06-16 MED ORDER — NAPROXEN 375 MG PO TBEC: 1.0000 | DELAYED_RELEASE_TABLET | Freq: Two times a day (BID) | ORAL | 1 refills | Status: DC

## 2022-06-16 NOTE — Progress Notes (Signed)
Subjective:  Erika Townsend is a 32 y.o. female who presents for Chief Complaint  Patient presents with   Knee Injury    Had a contusion to foot when a ladder felt on it. Pain has radiated to knee  and swelling. Pt would also like to see if she can get her ozempic lowered due to vomitting all the time     Here for right knee pain.  Started about a month ago.  She notes was walking and foot started cramping up.  Went home, elevated leg, used ice, but pain started going up leg into knee.  Lately knee hurts, swelling, and benign hurt, putting weight on the leg hurts.  No fall, on injury.  Has felt tingly at times.    Exercises with walking, treadmill, calisthenics.  On ozempic, not having appetite at all.  Wants to go down to 1mg  dose.  No other aggravating or relieving factors.    No other c/o.  Past Medical History:  Diagnosis Date   Abnormal glucose tolerance in pregnancy 03/23/2014   Asherman syndrome    Diabetes mellitus without complication (HCC)    pt states has problems with BS, but not diabetic   Endometritis following delivery 12/03/2012   Gestational diabetes    Hypertension    Incompetence of cervix    PCOS (polycystic ovarian syndrome)    Prediabetes    S/P cesarean section 04/21/2014   Current Outpatient Medications on File Prior to Visit  Medication Sig Dispense Refill   cyanocobalamin (VITAMIN B12) 1000 MCG tablet Take 1 tablet (1,000 mcg total) by mouth daily. 90 tablet 0   ondansetron (ZOFRAN) 4 MG tablet Take 1 tablet (4 mg total) by mouth every 8 (eight) hours as needed for nausea or vomiting. 20 tablet 1   valsartan-hydrochlorothiazide (DIOVAN-HCT) 160-12.5 MG tablet Take 1 tablet by mouth daily. 90 tablet 2   Vitamin D, Ergocalciferol, (DRISDOL) 1.25 MG (50000 UNIT) CAPS capsule Take 1 capsule (50,000 Units total) by mouth every 7 (seven) days. 12 capsule 0   No current facility-administered medications on file prior to visit.     The following portions  of the patient's history were reviewed and updated as appropriate: allergies, current medications, past family history, past medical history, past social history, past surgical history and problem list.  ROS Otherwise as in subjective above  Objective: BP 120/80   Pulse 74   Temp 97.6 F (36.4 C)   Wt 282 lb 12.8 oz (128.3 kg)   BMI 48.54 kg/m   Wt Readings from Last 3 Encounters:  06/16/22 282 lb 12.8 oz (128.3 kg)  05/20/22 287 lb 8 oz (130.4 kg)  04/14/22 296 lb 12.8 oz (134.6 kg)    General appearance: alert, no distress, well developed, well nourished MSK: Tender over the lateral collateral ligament of the right knee as well as patella, otherwise knee nontender, bending the knee does seem to cause some pain whereas extension does not cause pain, no laxity, no obvious swelling or pain with [place], rest of the right leg unremarkable other than flatfeet Legs neurovascularly intact    Assessment: Encounter Diagnoses  Name Primary?   Acute pain of right knee Yes   Pain and swelling of right knee    Decrease in appetite    Type 2 diabetes mellitus with hyperglycemia, without long-term current use of insulin (HCC)      Plan: Knee pain likely a combination of inflammation from overuse from recent exercise but also possibly footwear.  We  discussed using shoes with good arch support.  Will go ahead and refer to physical therapy as well.  Discussed recommendations as below.  I also lowered her Ozempic down to 1 mg given the decrease in appetite.   Patient Instructions  Recommendations Referral today for physical therapy Consider knee brace for the next 1-2 weeks Begin Naprosyn sent to pharmacy twice daily for 1 week, then once daily for a week, then as needed You can use cold therapy such as ice water 15-20 minutes a few times daily along with leg elevation and relative rest Make sure you choose work out shoes that that a good arch support Recheck in 6  weeks    Erika Townsend was seen today for knee injury.  Diagnoses and all orders for this visit:  Acute pain of right knee -     Ambulatory referral to Physical Therapy  Pain and swelling of right knee -     Ambulatory referral to Physical Therapy  Decrease in appetite  Type 2 diabetes mellitus with hyperglycemia, without long-term current use of insulin (HCC)  Other orders -     Semaglutide, 1 MG/DOSE, 4 MG/3ML SOPN; Inject 1 mg as directed once a week. -     Naproxen 375 MG TBEC; Take 1 tablet (375 mg total) by mouth in the morning and at bedtime.    Follow up: 6 weeks

## 2022-06-16 NOTE — Patient Instructions (Signed)
Recommendations Referral today for physical therapy Consider knee brace for the next 1-2 weeks Begin Naprosyn sent to pharmacy twice daily for 1 week, then once daily for a week, then as needed You can use cold therapy such as ice water 15-20 minutes a few times daily along with leg elevation and relative rest Make sure you choose work out shoes that that a good arch support Recheck in 6 weeks

## 2022-06-16 NOTE — Progress Notes (Signed)
Referral to [facility] for PT located in [place]

## 2022-06-20 ENCOUNTER — Other Ambulatory Visit (HOSPITAL_COMMUNITY): Payer: Self-pay

## 2022-06-20 ENCOUNTER — Ambulatory Visit (INDEPENDENT_AMBULATORY_CARE_PROVIDER_SITE_OTHER): Payer: Medicaid Other | Admitting: Neurology

## 2022-06-20 ENCOUNTER — Telehealth: Payer: Self-pay | Admitting: Pharmacy Technician

## 2022-06-20 ENCOUNTER — Encounter: Payer: Self-pay | Admitting: Neurology

## 2022-06-20 VITALS — BP 130/98 | HR 80 | Ht 64.0 in | Wt 286.0 lb

## 2022-06-20 DIAGNOSIS — E236 Other disorders of pituitary gland: Secondary | ICD-10-CM

## 2022-06-20 DIAGNOSIS — I10 Essential (primary) hypertension: Secondary | ICD-10-CM | POA: Diagnosis not present

## 2022-06-20 DIAGNOSIS — G43101 Migraine with aura, not intractable, with status migrainosus: Secondary | ICD-10-CM | POA: Diagnosis not present

## 2022-06-20 MED ORDER — RIZATRIPTAN BENZOATE 10 MG PO TABS: 10.0000 mg | ORAL_TABLET | ORAL | 5 refills | Status: DC | PRN

## 2022-06-20 MED ORDER — QULIPTA 60 MG PO TABS
60.0000 mg | ORAL_TABLET | Freq: Every day | ORAL | 5 refills | Status: DC
Start: 2022-06-20 — End: 2023-03-02

## 2022-06-20 NOTE — Telephone Encounter (Signed)
Patient Advocate Encounter  Received notification from [employer] that prior authorization for QULIPTA 60MG  is required.   PA submitted on 6.10.24 Key [id number] Status is pending
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
## 2022-06-20 NOTE — Patient Instructions (Addendum)
Start Qulipta 60mg  daily.  If no improvement in headaches in 3 months, contact me Stop Excedrin.  Take rizatriptan 10mg  at earliest onset of migraine.  May repeat after 2 hours.  Maximum 2 tablets in 24 hours.  Take ondansetron for nausea Follow up in 6 months.

## 2022-06-21 ENCOUNTER — Ambulatory Visit: Payer: Medicaid Other | Admitting: Medical

## 2022-06-21 ENCOUNTER — Other Ambulatory Visit (HOSPITAL_COMMUNITY): Payer: Self-pay

## 2022-06-21 NOTE — Telephone Encounter (Signed)
Patient Advocate Encounter  Prior Authorization for QULIPTA 60MG  has been approved with [employer].    PA# [id number] Effective dates: 6.10.24 through 9.8.24  Per [employer] test claim, UNABLE TO PROVIDE COPAY

## 2022-06-26 ENCOUNTER — Encounter (HOSPITAL_BASED_OUTPATIENT_CLINIC_OR_DEPARTMENT_OTHER): Payer: Self-pay

## 2022-06-26 ENCOUNTER — Emergency Department (HOSPITAL_BASED_OUTPATIENT_CLINIC_OR_DEPARTMENT_OTHER)
Admission: EM | Admit: 2022-06-26 | Discharge: 2022-06-26 | Disposition: A | Discharge status: Home / Self Care | Payer: Medicaid Other | Attending: Emergency Medicine | Admitting: Emergency Medicine

## 2022-06-26 ENCOUNTER — Emergency Department (HOSPITAL_BASED_OUTPATIENT_CLINIC_OR_DEPARTMENT_OTHER): Payer: Medicaid Other

## 2022-06-26 ENCOUNTER — Other Ambulatory Visit: Payer: Self-pay

## 2022-06-26 DIAGNOSIS — R519 Headache, unspecified: Secondary | ICD-10-CM | POA: Diagnosis not present

## 2022-06-26 DIAGNOSIS — D72829 Elevated white blood cell count, unspecified: Secondary | ICD-10-CM | POA: Diagnosis not present

## 2022-06-26 DIAGNOSIS — Z79899 Other long term (current) drug therapy: Secondary | ICD-10-CM | POA: Insufficient documentation

## 2022-06-26 DIAGNOSIS — R0789 Other chest pain: Secondary | ICD-10-CM | POA: Diagnosis not present

## 2022-06-26 DIAGNOSIS — I10 Essential (primary) hypertension: Secondary | ICD-10-CM | POA: Insufficient documentation

## 2022-06-26 LAB — CBC
HCT: 38.8 % (ref 36.0–46.0)
Hemoglobin: 12.6 g/dL (ref 12.0–15.0)
MCH: 27.5 pg (ref 26.0–34.0)
MCHC: 32.5 g/dL (ref 30.0–36.0)
MCV: 84.7 fL (ref 80.0–100.0)
Platelets: 290 10*3/uL (ref 150–400)
RBC: 4.58 MIL/uL (ref 3.87–5.11)
RDW: 13.2 % (ref 11.5–15.5)
WBC: 10.8 10*3/uL — ABNORMAL HIGH (ref 4.0–10.5)
nRBC: 0 % (ref 0.0–0.2)

## 2022-06-26 LAB — BASIC METABOLIC PANEL
Anion gap: 7 (ref 5–15)
BUN: 10 mg/dL (ref 6–20)
CO2: 28 mmol/L (ref 22–32)
Calcium: 9.5 mg/dL (ref 8.9–10.3)
Chloride: 104 mmol/L (ref 98–111)
Creatinine, Ser: 0.86 mg/dL (ref 0.44–1.00)
GFR, Estimated: >60 mL/min (ref >60)
Glucose, Bld: 106 mg/dL — ABNORMAL HIGH (ref 70–99)
Potassium: 3.8 mmol/L (ref 3.5–5.1)
Sodium: 139 mmol/L (ref 135–145)

## 2022-06-26 LAB — TROPONIN I (HIGH SENSITIVITY)
Troponin I (High Sensitivity): 3 ng/L (ref <18)
Troponin I (High Sensitivity): 3 ng/L (ref <18)

## 2022-06-26 NOTE — ED Notes (Signed)
Re assess patient  States still has headache but chest pain is better. States that she  forgot to take BP medications this am.

## 2022-06-26 NOTE — ED Triage Notes (Signed)
States has been having high blood pressure.  States  was 182/113.  Having central chest pressure that comes and goes.  Not having the pressure at present  states also having a headache

## 2022-06-26 NOTE — Discharge Instructions (Signed)
You are seen in the emergency department for hypertension and chest pain.  Her blood pressure is slightly elevated but not concerning at this point.  Your labs and imaging were unremarkable for any acute cause of your chest pain.  Please take your blood pressure medicine at home tonight.  Follow-up with your primary care provider for further evaluation.  If you have any acute worsening of your symptoms, please return to the emergency department.

## 2022-06-26 NOTE — ED Provider Notes (Signed)
[place] EMERGENCY DEPARTMENT AT [facility] Provider Note   CSN: [id number] Arrival date & time: 06/26/22  1740     History Chief Complaint  Patient presents with   Chest Pain   Hypertension    Erika Townsend is a 32 y.o. female.  Patient presents to the emergency department with complaints of hypertension and chest pain.  Reports that this happened this evening after she ate a beef or pork hotdog.  She reports he typically has adverse reactions to beef and pork and they typically induce a headache and elevation of blood pressure.  Patient did not take blood pressure medication today as well as this is likely contributing to her elevated blood pressure.  Denies any shortness of breath, abdominal pain, nausea, vomiting.  Does endorse a mild headache but this is also typical for her when she eats beef or pork.   Chest Pain Hypertension Associated symptoms include chest pain.       Home Medications Prior to Admission medications   Medication Sig Start Date End Date Taking? Authorizing Provider  Atogepant (QULIPTA) 60 MG TABS Take 1 tablet (60 mg total) by mouth daily. 06/20/22   Drema Dallas, DO  cyanocobalamin (VITAMIN B12) 1000 MCG tablet Take 1 tablet (1,000 mcg total) by mouth daily. 04/15/22   Tysinger, Kermit Balo, PA-C  Naproxen 375 MG TBEC Take 1 tablet (375 mg total) by mouth in the morning and at bedtime. 06/16/22   Tysinger, Kermit Balo, PA-C  ondansetron (ZOFRAN) 4 MG tablet Take 1 tablet (4 mg total) by mouth every 8 (eight) hours as needed for nausea or vomiting. 05/20/22   Tysinger, Kermit Balo, PA-C  rizatriptan (MAXALT) 10 MG tablet Take 1 tablet (10 mg total) by mouth as needed for migraine. May repeat in 2 hours if needed.  Maximum 2 tablets in 24 hours. 06/20/22   Drema Dallas, DO  Semaglutide, 1 MG/DOSE, 4 MG/3ML SOPN Inject 1 mg as directed once a week. 06/16/22   Tysinger, Kermit Balo, PA-C  valsartan-hydrochlorothiazide (DIOVAN-HCT) 160-12.5 MG tablet Take 1 tablet by  mouth daily. 05/20/22   Tysinger, Kermit Balo, PA-C  Vitamin D, Ergocalciferol, (DRISDOL) 1.25 MG (50000 UNIT) CAPS capsule Take 1 capsule (50,000 Units total) by mouth every 7 (seven) days. 04/15/22   Tysinger, Kermit Balo, PA-C      Allergies    Cinnamon, Penicillins, Diphenhydramine, Emgality [galcanezumab-gnlm], Flagyl [metronidazole], and Tape    Review of Systems   Review of Systems  Cardiovascular:  Positive for chest pain.  All other systems reviewed and are negative.   Physical Exam Updated Vital Signs BP (!) 150/98   Pulse 64   Temp 98.1 F (36.7 C) (Oral)   Resp 18   Ht 5\' 3"  (1.6 m)   Wt 128.8 kg   SpO2 100%   BMI 50.31 kg/m  Physical Exam Vitals and nursing note reviewed.  Constitutional:      General: She is not in acute distress.    Appearance: She is well-developed.  HENT:     Head: Normocephalic and atraumatic.  Eyes:     Conjunctiva/sclera: Conjunctivae normal.  Cardiovascular:     Rate and Rhythm: Normal rate and regular rhythm.     Heart sounds: No murmur heard. Pulmonary:     Effort: Pulmonary effort is normal. No respiratory distress.     Breath sounds: Normal breath sounds.  Abdominal:     Palpations: Abdomen is soft.     Tenderness: There is no abdominal tenderness.  Musculoskeletal:        General: No swelling.     Cervical back: Neck supple.  Skin:    General: Skin is warm and dry.     Capillary Refill: Capillary refill takes less than 2 seconds.  Neurological:     Mental Status: She is alert.  Psychiatric:        Mood and Affect: Mood normal.     ED Results / Procedures / Treatments   Labs (all labs ordered are listed, but only abnormal results are displayed) Labs Reviewed  BASIC METABOLIC PANEL - Abnormal; Notable for the following components:      Result Value   Glucose, Bld 106 (*)    All other components within normal limits  CBC - Abnormal; Notable for the following components:   WBC 10.8 (*)    All other components within normal  limits  TROPONIN I (HIGH SENSITIVITY)  TROPONIN I (HIGH SENSITIVITY)    EKG EKG Interpretation  Date/Time:  Sunday June 26 2022 17:54:02 EDT Ventricular Rate:  79 PR Interval:  144 QRS Duration: 78 QT Interval:  380 QTC Calculation: 435 R Axis:   -65 Text Interpretation: Normal sinus rhythm Left axis deviation Low voltage QRS Cannot rule out Anterior infarct , age undetermined Abnormal ECG No previous ECGs available no prior ECG for comparison. No STEMI Confirmed by Theda Belfast ([id number]) on 06/26/2022 7:58:22 PM  Radiology No results found.  Procedures Procedures   Medications Ordered in ED Medications - No data to display  ED Course/ Medical Decision Making/ A&P                           Medical Decision Making Amount and/or Complexity of Data Reviewed Labs: ordered. Radiology: ordered.   This patient presents to the ED for concern of chest pain and hypertension. Differential diagnosis includes ACS, GERD, PE, allergic reaction, gastroenteritis    Lab Tests:  I Ordered, and personally interpreted labs.  The pertinent results include:  CBC with mild leukocytosis, likely stress induced as no evidence of infection, BMP unremarkable, troponin negagtive   Imaging Studies ordered:  I ordered imaging studies including chest xray  I independently visualized and interpreted imaging which showed no evidence of any acute cardiopulmonary process I agree with the radiologist interpretation   Problem List / ED Course:  Patient presented to the ED with concerns of chest pain and hypertension. Known history of hypertension currently managed with Diovan-HCT. States that she believes her symptoms began today after eating hot dogs that were made with beef/pork. Unclear history regarding potential meat allergy, but she states that this is typically how she reacts after coming into contact with these meats. Denies nausea, vomiting, abdominal pain, or shortness of breath. Low concern  for acute pathology as patient states her symptoms have now resolved and believes her blood pressure is elevated due to forgetting to take her medication. Labs initiated from triage which were largely unremarkable with exception of mild elevation in WBC. Not likely infectious source and more likely stress or allergy mediated given known history of allergic-like reactions with certain proteins. EKG and chest xray reassuring without any acute abnormalities noted to indicate STEMI, HOCM, or pericarditis. Given lack of evidence of airway compromise such as difficulty swallowing, SOB, tongue swelling, I believe patient is stable for discharge home and outpatient follow up. Advised patient to follow up with PCP. Patient is agreeable with treatment plan and verbalized understanding all return precautions.  All questions answered prior to patient discharge.  Final Clinical Impression(s) / ED Diagnoses Final diagnoses:  Hypertension, unspecified type  Other chest pain    Rx / DC Orders ED Discharge Orders     None         Salomon Mast 07/01/22 2137    Tegeler, Canary Brim, MD 07/04/22 1747

## 2022-07-01 NOTE — Telephone Encounter (Signed)
ERROR

## 2022-07-07 ENCOUNTER — Ambulatory Visit: Payer: Medicaid Other | Admitting: Neurology

## 2022-07-07 ENCOUNTER — Telehealth: Payer: Self-pay | Admitting: Medical

## 2022-07-07 NOTE — Telephone Encounter (Signed)
I have faxed over notes from pt's visit to [employer] [facility] [phone]

## 2022-07-07 NOTE — Telephone Encounter (Signed)
Renea Ee from [facility] called and states she needs the chart notes for the pts Knee brace order.

## 2022-07-20 ENCOUNTER — Telehealth: Payer: Self-pay | Admitting: Medical

## 2022-07-20 NOTE — Telephone Encounter (Signed)
I have faxed over notes from pys visit for the second time along with insurance information they requested. Fax number they provided [phone].

## 2022-07-22 ENCOUNTER — Telehealth: Payer: Self-pay | Admitting: Medical

## 2022-07-22 NOTE — Telephone Encounter (Signed)
[employer] called and states they are not contracted with her insurance and will be unable to provide a knee brace. She says it will have to go through another company.

## 2022-07-22 NOTE — Telephone Encounter (Signed)
Pt was notified that she can go pay out of pocket for a knee brace since she can't find a place to take her insurance. She recently went to [facility] ER for knee pain and just been wrapping it up

## 2022-08-20 ENCOUNTER — Telehealth: Payer: Self-pay | Admitting: Medical

## 2022-08-20 NOTE — Telephone Encounter (Signed)
P.A. Ronnell [place] medication has been discontinued

## 2022-08-26 ENCOUNTER — Other Ambulatory Visit (HOSPITAL_COMMUNITY): Payer: Self-pay

## 2022-08-26 ENCOUNTER — Telehealth: Payer: Self-pay | Admitting: Pharmacy Technician

## 2022-08-26 NOTE — Telephone Encounter (Signed)
Pharmacy Patient Advocate Encounter   Received notification from [employer] that prior authorization for QULIPTA 60MG  is required/requested.   Insurance verification completed.   The patient is insured through [facility] .   Per test claim: PA required; PA submitted to [facility] via Cover[employer] Key/confirmation #/EOC [id number] Status is pending

## 2022-08-26 NOTE — Telephone Encounter (Signed)
Pharmacy Patient Advocate Encounter  Received notification from [facility] that Prior Authorization for QULIPTA 60MG  has been APPROVED from 8.16.24 to 8.16.25   PA #/Case ID/Reference #: [id number]

## 2022-09-07 ENCOUNTER — Ambulatory Visit: Payer: Medicaid Other | Admitting: Medical

## 2022-09-11 DIAGNOSIS — Z419 Encounter for procedure for purposes other than remedying health state, unspecified: Secondary | ICD-10-CM | POA: Diagnosis not present

## 2022-09-13 ENCOUNTER — Ambulatory Visit: Payer: Medicaid Other | Admitting: Medical

## 2022-10-11 DIAGNOSIS — Z419 Encounter for procedure for purposes other than remedying health state, unspecified: Secondary | ICD-10-CM | POA: Diagnosis not present

## 2022-11-08 ENCOUNTER — Telehealth: Payer: Self-pay | Admitting: Medical

## 2022-11-08 NOTE — Telephone Encounter (Signed)
I received a form regarding disability.  Please call patient about this.    I haven't completed any prior disability paperwork.  What is this form regarding?  Did another doctor take her out of work?   I am not sure I can complete the form, so find about the above questions

## 2022-11-09 ENCOUNTER — Telehealth: Payer: Self-pay | Admitting: Medical

## 2022-11-09 ENCOUNTER — Other Ambulatory Visit: Payer: Self-pay | Admitting: Medical

## 2022-11-09 DIAGNOSIS — G8929 Other chronic pain: Secondary | ICD-10-CM

## 2022-11-09 NOTE — Telephone Encounter (Signed)
Form for disability was sent to [facility] but he has not seen pt since June and needs to do further steps before completion. Form has been placed in pick up folder in the front until a decision is made.

## 2022-11-11 ENCOUNTER — Telehealth (INDEPENDENT_AMBULATORY_CARE_PROVIDER_SITE_OTHER): Payer: Medicaid Other | Admitting: Medical

## 2022-11-11 VITALS — BP 179/100 | Temp 97.9°F | Wt 282.0 lb

## 2022-11-11 DIAGNOSIS — I10 Essential (primary) hypertension: Secondary | ICD-10-CM | POA: Diagnosis not present

## 2022-11-11 DIAGNOSIS — T50905D Adverse effect of unspecified drugs, medicaments and biological substances, subsequent encounter: Secondary | ICD-10-CM | POA: Diagnosis not present

## 2022-11-11 DIAGNOSIS — G8929 Other chronic pain: Secondary | ICD-10-CM | POA: Diagnosis not present

## 2022-11-11 DIAGNOSIS — E1165 Type 2 diabetes mellitus with hyperglycemia: Secondary | ICD-10-CM | POA: Diagnosis not present

## 2022-11-11 DIAGNOSIS — E66813 Obesity, class 3: Secondary | ICD-10-CM | POA: Diagnosis not present

## 2022-11-11 DIAGNOSIS — M25561 Pain in right knee: Secondary | ICD-10-CM

## 2022-11-11 DIAGNOSIS — Z6841 Body Mass Index (BMI) 40.0 and over, adult: Secondary | ICD-10-CM | POA: Diagnosis not present

## 2022-11-11 DIAGNOSIS — Z419 Encounter for procedure for purposes other than remedying health state, unspecified: Secondary | ICD-10-CM | POA: Diagnosis not present

## 2022-11-11 MED ORDER — LANCET DEVICE MISC: 0 refills | Status: AC

## 2022-11-11 MED ORDER — VITAMIN D (ERGOCALCIFEROL) 1.25 MG (50000 UNIT) PO CAPS: 50000.0000 [IU] | ORAL_CAPSULE | ORAL | 2 refills | Status: DC

## 2022-11-11 MED ORDER — BLOOD GLUCOSE TEST VI STRP: ORAL_STRIP | 0 refills | Status: AC

## 2022-11-11 MED ORDER — VALSARTAN-HYDROCHLOROTHIAZIDE 320-25 MG PO TABS: 1.0000 | ORAL_TABLET | Freq: Every day | ORAL | 0 refills | Status: DC

## 2022-11-11 MED ORDER — LANCETS MISC. MISC: 0 refills | Status: AC

## 2022-11-11 MED ORDER — TRULICITY 0.75 MG/0.5ML ~~LOC~~ SOAJ: 0.7500 mg | SUBCUTANEOUS | 0 refills | Status: DC

## 2022-11-11 MED ORDER — BLOOD GLUCOSE MONITORING SUPPL DEVI: 0 refills | Status: AC

## 2022-11-11 MED ORDER — TRULICITY 1.5 MG/0.5ML ~~LOC~~ SOAJ: 1.5000 mg | SUBCUTANEOUS | 0 refills | Status: DC

## 2022-11-11 MED ORDER — ONDANSETRON HCL 4 MG PO TABS: 4.0000 mg | ORAL_TABLET | Freq: Three times a day (TID) | ORAL | 1 refills | Status: DC | PRN

## 2022-11-11 NOTE — Patient Instructions (Signed)
Hypertension she had on her own changed to nighttime dosing.  Fortunately she recently just changed back to morning dosing Continue taking her blood pressure pill in the morning You currently have valsartan HCT 160/12.5 mg tablets.  Take 2 tablets daily of this dose until you run out When you pick up your new prescription you will be valsartan HCT 320/25 mg, 1 tablet daily Limit salt, limit portion size, work on efforts to lose weight Monitor your blood pressures at home and bring your numbers with you at your next visit Recheck in 4 to 6 weeks in person  Diabetes You have not tolerated metformin and Ozempic Begin trial of Trulicity which will hopefully be less problematic for nausea Begin Trulicity 0.75 mg weekly for the first month, then increase to the 1.5 mg weekly dose If you have any problems this right away let me know You can use Zofran for nausea We will call out a glucometer so you can start checking your blood sugars a few mornings per week.  Write these numbers down and bring them with you at your next visit  Obesity Continue efforts to lose weight with small portion sizes, continue to eat healthy amount of fruits and vegetables, lean cuts of meat Avoid high carb, high sugar foods  Chronic right knee pain Use cool therapy such as ice water 20 minutes at a time a few times per day for pain and inflammation You can use Tylenol as needed for pain Elevate the leg when possible to reduce swelling Call the orthopedic office that we referred you to to get an appointment ASAP

## 2022-11-11 NOTE — Progress Notes (Signed)
Subjective:     Patient ID: Erika Townsend, female   DOB: 1990/03/04, 32 y.o.   MRN: [id number]  This visit type was conducted due to national recommendations for restrictions regarding the COVID-19 Pandemic (e.g. social distancing) in an effort to limit this patient's exposure and mitigate transmission in our community.  Due to their co-morbid illnesses, this patient is at least at moderate risk for complications without adequate follow up.  This format is felt to be most appropriate for this patient at this time.    Documentation for virtual audio and video telecommunications through [facility] encounter:  The patient was located at home. The provider was located in the office. The patient did consent to this visit and is aware of possible charges through their insurance for this visit.  The other persons participating in this telemedicine service were none. Time spent on call was 20 minutes and in review of previous records 20 minutes total.  This virtual service is not related to other E/M service within previous 7 days.   HPI Chief Complaint  Patient presents with   Consult    Discuss concerns- knee pain and blood pressure 179/110 169/110. Ozempic has been making her throw up bad so she stopped taking it in September    Virtual for concerns.  Last visit July 2024  I had requested a visit with her as I received disability paperwork recently but have not seen her since July.  When we called to inquire if she notes that she was still having knee pains and this is what prompted the disability paperwork although she has not been back since July  Still having ongoing right knee pain.  Right knee is swelling on average every other day, worse with walking.  Right knee only.   Foot is swelling some too.  Our office referred to ortho this week and she has gotten phone call from them to schedule.    Hypertension-blood pressures have been elevated of late.  Compliant with valsartan HCT  160/12.5 mg daily  Diabetes-she was taking Ozempic but stopped it in September due to nausea and vomiting.  Not currently on any medication for diabetes as she stopped Ozempic.  Is more hungry of late, eating more, gaining weight.  Doesn't have glucometer.  Didn't tolerate metformin in the past due to nausea and diarrhea and rash.    Hasn't been back to work since 08/15/2022 due to knee pain.  No other aggravating or relieving factors. No other complaint.    Past Medical History:  Diagnosis Date   Abnormal glucose tolerance in pregnancy 03/23/2014   Asherman syndrome    Diabetes mellitus without complication (HCC)    pt states has problems with BS, but not diabetic   Endometritis following delivery 12/03/2012   Gestational diabetes    Hypertension    Incompetence of cervix    PCOS (polycystic ovarian syndrome)    Prediabetes    S/P cesarean section 04/21/2014   Current Outpatient Medications on File Prior to Visit  Medication Sig Dispense Refill   cyanocobalamin (VITAMIN B12) 1000 MCG tablet Take 1 tablet (1,000 mcg total) by mouth daily. 90 tablet 0   rizatriptan (MAXALT) 10 MG tablet Take 1 tablet (10 mg total) by mouth as needed for migraine. May repeat in 2 hours if needed.  Maximum 2 tablets in 24 hours. 10 tablet 5   Atogepant (QULIPTA) 60 MG TABS Take 1 tablet (60 mg total) by mouth daily. 30 tablet 5   No current facility-administered  medications on file prior to visit.    Review of Systems As in subjective    Objective:   Physical Exam Due to coronavirus pandemic stay at home measures, patient visit was virtual and they were not examined in person.   BP (!) 179/100   Temp 97.9 F (36.6 C)   Wt 282 lb (127.9 kg)   BMI 49.95 kg/m   BP Readings from Last 3 Encounters:  11/11/22 (!) 179/100  06/26/22 (!) 150/98  06/20/22 (!) 130/98   Wt Readings from Last 3 Encounters:  11/11/22 282 lb (127.9 kg)  06/26/22 284 lb (128.8 kg)  06/20/22 286 lb (129.7 kg)   Gen:  wd, wn ,nad O[place] not examined       Assessment:     Encounter Diagnoses  Name Primary?   Essential hypertension, benign Yes   Type 2 diabetes mellitus with hyperglycemia, without long-term current use of insulin (HCC)    Chronic pain of right knee    Obesity, Class III, BMI 40-49.9 (morbid obesity) (HCC)    Medication adverse effect, subsequent encounter        Plan:     Hypertension she had on her own changed to nighttime dosing.  Fortunately she recently just changed back to morning dosing Continue taking her blood pressure pill in the morning You currently have valsartan HCT 160/12.5 mg tablets.  Take 2 tablets daily of this dose until you run out When you pick up your new prescription you will be valsartan HCT 320/25 mg, 1 tablet daily Limit salt, limit portion size, work on efforts to lose weight Monitor your blood pressures at home and bring your numbers with you at your next visit Recheck in 4 to 6 weeks in person  Diabetes You have not tolerated metformin and Ozempic Begin trial of Trulicity which will hopefully be less problematic for nausea Begin Trulicity 0.75 mg weekly for the first month, then increase to the 1.5 mg weekly dose If you have any problems this right away let me know You can use Zofran for nausea We will call out a glucometer so you can start checking your blood sugars a few mornings per week.  Write these numbers down and bring them with you at your next visit  Obesity Continue efforts to lose weight with small portion sizes, continue to eat healthy amount of fruits and vegetables, lean cuts of meat Avoid high carb, high sugar foods  Chronic right knee pain Use cool therapy such as ice water 20 minutes at a time a few times per day for pain and inflammation You can use Tylenol as needed for pain Elevate the leg when possible to reduce swelling Call the orthopedic office that we referred you to to get an appointment ASAP   Secily was  seen today for consult.  Diagnoses and all orders for this visit:  Essential hypertension, benign  Type 2 diabetes mellitus with hyperglycemia, without long-term current use of insulin (HCC)  Chronic pain of right knee  Obesity, Class III, BMI 40-49.9 (morbid obesity) (HCC)  Medication adverse effect, subsequent encounter  Other orders -     valsartan-hydrochlorothiazide (DIOVAN-HCT) 320-25 MG tablet; Take 1 tablet by mouth daily. -     Dulaglutide (TRULICITY) 0.75 MG/0.5ML SOAJ; Inject 0.75 mg into the skin once a week. -     Dulaglutide (TRULICITY) 1.5 MG/0.5ML SOAJ; Inject 1.5 mg into the skin once a week. -     Vitamin D, Ergocalciferol, (DRISDOL) 1.25 MG (50000 UNIT) CAPS  capsule; Take 1 capsule (50,000 Units total) by mouth every 7 (seven) days. -     ondansetron (ZOFRAN) 4 MG tablet; Take 1 tablet (4 mg total) by mouth every 8 (eight) hours as needed for nausea or vomiting.  F/u 4-6 weeks

## 2022-11-11 NOTE — Progress Notes (Signed)
Sent in supplies

## 2022-11-11 NOTE — Addendum Note (Signed)
Addended by: Herminio Commons A on: 11/11/2022 09:30 AM   Modules accepted: Orders

## 2022-11-15 ENCOUNTER — Telehealth: Payer: Self-pay | Admitting: Medical

## 2022-11-15 ENCOUNTER — Ambulatory Visit: Payer: Medicaid Other | Admitting: Physician Assistant

## 2022-11-15 NOTE — Telephone Encounter (Signed)
Erika Townsend called, they have questions about note/information you send on disability forms  Sending back in folder, questions on first sheet

## 2022-11-17 NOTE — Telephone Encounter (Signed)
Faxed back.

## 2022-11-23 ENCOUNTER — Ambulatory Visit: Payer: Medicaid Other | Admitting: Physician Assistant

## 2022-11-24 ENCOUNTER — Telehealth: Payer: Self-pay

## 2022-11-24 NOTE — Telephone Encounter (Signed)
KeyLucky Rathke PA Case ID #: [id number] Rx #: [id number] Outcome: Approved today by [facility] Medicaid 2017  Approved. This drug has been approved. Approved quantity: 2 units per 28 day(s). The drug has been approved from 11/10/2022 to 11/24/2023. Please call the pharmacy to process your prescription claim. Generic or biosimilar substitution may be required when available and preferred on the formulary.  Authorization Expiration Date: 11/24/2023 Drug: Trulicity 0.75MG /0.5ML auto-injectors Form: [employer] Medicaid of [employer] Prior Authorization Request Form [phone] NCPDP)

## 2022-11-24 NOTE — Telephone Encounter (Signed)
Pt notified via MyChart message, unable to reach via phone. Approval faxed to pharmacy.

## 2022-11-24 NOTE — Telephone Encounter (Signed)
KeyLucky Rathke PA Case ID #: [id number] Rx #: [id number] Status: sent iconSent to Plan today Drug: Trulicity 0.75MG /0.5ML auto-injectors Form: [facility] Medicaid of [employer] Prior Authorization Request Form [phone] NCPDP)

## 2022-12-05 ENCOUNTER — Ambulatory Visit: Payer: Medicaid Other | Admitting: Physician Assistant

## 2022-12-06 ENCOUNTER — Ambulatory Visit: Payer: Medicaid Other | Admitting: Physician Assistant

## 2022-12-11 DIAGNOSIS — Z419 Encounter for procedure for purposes other than remedying health state, unspecified: Secondary | ICD-10-CM | POA: Diagnosis not present
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
## 2022-12-28 ENCOUNTER — Encounter: Payer: Self-pay | Admitting: Neurology

## 2022-12-28 ENCOUNTER — Ambulatory Visit: Payer: Medicaid Other | Admitting: Neurology

## 2023-01-11 DIAGNOSIS — Z419 Encounter for procedure for purposes other than remedying health state, unspecified: Secondary | ICD-10-CM | POA: Diagnosis not present

## 2023-01-18 DIAGNOSIS — Z3200 Encounter for pregnancy test, result unknown: Secondary | ICD-10-CM | POA: Diagnosis not present

## 2023-01-23 ENCOUNTER — Ambulatory Visit: Payer: Medicaid Other | Admitting: Medical

## 2023-02-11 DIAGNOSIS — Z419 Encounter for procedure for purposes other than remedying health state, unspecified: Secondary | ICD-10-CM | POA: Diagnosis not present

## 2023-03-02 ENCOUNTER — Ambulatory Visit (INDEPENDENT_AMBULATORY_CARE_PROVIDER_SITE_OTHER): Payer: Medicaid Other | Admitting: Medical

## 2023-03-02 ENCOUNTER — Other Ambulatory Visit: Payer: Self-pay | Admitting: Medical

## 2023-03-02 ENCOUNTER — Telehealth: Payer: Self-pay

## 2023-03-02 ENCOUNTER — Other Ambulatory Visit (HOSPITAL_COMMUNITY): Payer: Self-pay

## 2023-03-02 ENCOUNTER — Encounter: Payer: Self-pay | Admitting: Medical

## 2023-03-02 VITALS — BP 122/70 | HR 83 | Temp 97.7°F | Wt 292.4 lb

## 2023-03-02 DIAGNOSIS — I10 Essential (primary) hypertension: Secondary | ICD-10-CM | POA: Diagnosis not present

## 2023-03-02 DIAGNOSIS — R519 Headache, unspecified: Secondary | ICD-10-CM

## 2023-03-02 DIAGNOSIS — L719 Rosacea, unspecified: Secondary | ICD-10-CM | POA: Insufficient documentation

## 2023-03-02 DIAGNOSIS — G43901 Migraine, unspecified, not intractable, with status migrainosus: Secondary | ICD-10-CM | POA: Insufficient documentation

## 2023-03-02 DIAGNOSIS — K0889 Other specified disorders of teeth and supporting structures: Secondary | ICD-10-CM

## 2023-03-02 DIAGNOSIS — R0981 Nasal congestion: Secondary | ICD-10-CM

## 2023-03-02 DIAGNOSIS — E1165 Type 2 diabetes mellitus with hyperglycemia: Secondary | ICD-10-CM

## 2023-03-02 DIAGNOSIS — R0789 Other chest pain: Secondary | ICD-10-CM | POA: Diagnosis not present

## 2023-03-02 LAB — POC COVID19 BINAXNOW: SARS Coronavirus 2 Ag: NEGATIVE

## 2023-03-02 LAB — POCT INFLUENZA A/B
Influenza A, POC: NEGATIVE
Influenza B, POC: NEGATIVE

## 2023-03-02 MED ORDER — DM-GUAIFENESIN ER 30-600 MG PO TB12: 1.0000 | ORAL_TABLET | Freq: Two times a day (BID) | ORAL | 0 refills | Status: DC

## 2023-03-02 MED ORDER — NURTEC 75 MG PO TBDP: 1.0000 | ORAL_TABLET | ORAL | 1 refills | Status: DC

## 2023-03-02 MED ORDER — ONDANSETRON HCL 4 MG PO TABS: 4.0000 mg | ORAL_TABLET | Freq: Three times a day (TID) | ORAL | 1 refills | Status: DC | PRN

## 2023-03-02 MED ORDER — RIZATRIPTAN BENZOATE 10 MG PO TABS: 10.0000 mg | ORAL_TABLET | ORAL | 1 refills | Status: AC | PRN

## 2023-03-02 MED ORDER — TRULICITY 1.5 MG/0.5ML ~~LOC~~ SOAJ: 1.5000 mg | SUBCUTANEOUS | 1 refills | Status: DC

## 2023-03-02 MED ORDER — TRULICITY 0.75 MG/0.5ML ~~LOC~~ SOAJ: 0.7500 mg | SUBCUTANEOUS | 0 refills | Status: DC

## 2023-03-02 MED ORDER — DOXYCYCLINE HYCLATE 100 MG PO TABS: 100.0000 mg | ORAL_TABLET | Freq: Two times a day (BID) | ORAL | 0 refills | Status: DC

## 2023-03-02 MED ORDER — QULIPTA 60 MG PO TABS: 60.0000 mg | ORAL_TABLET | Freq: Every day | ORAL | 2 refills | Status: DC

## 2023-03-02 MED ORDER — VALSARTAN-HYDROCHLOROTHIAZIDE 320-25 MG PO TABS: 1.0000 | ORAL_TABLET | Freq: Every day | ORAL | 2 refills | Status: AC

## 2023-03-02 NOTE — Telephone Encounter (Signed)
 Pharmacy Patient Advocate Encounter   Received notification from CoverMyMeds that prior authorization for Trulicity 1.5MG /0.5ML auto-injectors  is required/requested.   Insurance verification completed.   The patient is insured through [facility] [place] .   Per test claim: PA required; PA submitted to above mentioned insurance via CoverMyMeds Key/confirmation #/EOC Key: [id number]    Status is pending

## 2023-03-02 NOTE — Telephone Encounter (Signed)
 Pharmacy Patient Advocate Encounter  Received notification from [facility] Medicaid that Prior Authorization for Trulicity 1.5MG /0.5ML auto-injectors has been APPROVED from 2.6.25 to 2.20.26. Ran test claim, Copay is $4.00. This test claim was processed through [facility]- copay amounts may vary at other pharmacies due to pharmacy/plan contracts, or as the patient moves through the different stages of their insurance plan.   PA #/Case ID/Reference #:  Key: [id number]

## 2023-03-02 NOTE — Telephone Encounter (Signed)
 Pharmacy Patient Advocate Encounter  Received notification from [facility] Medicaid that Prior Authorization for  Qulipta 60MG  tablets has been DENIED.  Full denial letter will be uploaded to the media tab. See denial reason below.    Pt has different insurance and requires trials and failure of 2 preferred injectable CGRPS. Please note that pertinent information was included.

## 2023-03-02 NOTE — Patient Instructions (Signed)
 Recommendations: For your current sinus and headache symptoms, begin 5 days of Mucinex DM to help with mucus and congestion Continue the clindamycin antibiotic you are on per dentist Lets add a different antibiotic since you are not really responding all that well to the current antibiotic.  Add doxycycline twice daily for 5 days then go once daily for rosacea I refilled your other medicines today including Qulipta preventative headache medicine that you take once daily, Maxalt which is an abortive headache medicine you can use as needed for worse migraine I refilled Trulicity, finish out 0.75 mg weekly for a month then go up to the 1.5 mg.  This is for blood sugar control and help with weight I refilled Zofran to use as needed for nausea I refilled valsartan HCT blood pressure medicine  Drink plenty of fluids over the next few days, consider nasal saline flush  If not much improved by Monday then let me know

## 2023-03-02 NOTE — Telephone Encounter (Signed)
 Pharmacy Patient Advocate Encounter   Received notification from CoverMyMeds that prior authorization for Qulipta 60MG  tablets  is required/requested.   Insurance verification completed.   The patient is insured through [facility] [place] .   Per test claim: PA required; PA submitted to above mentioned insurance via CoverMyMeds Key/confirmation #/EOC Key: [id number]    Status is pending

## 2023-03-02 NOTE — Progress Notes (Signed)
 Subjective:  Erika Townsend is a 33 y.o. female who presents for Chief Complaint  Patient presents with   chest pain    Chest pain, eye pain, sinus pressure, sore throat that is scratchy,  headaches, and back of head hurts x 1 week. Kids have been sick. Everyone was negative for Covid.      Here for headache, eyes hurt, facial pressure, back of neck all hurts.  Started like tension headache, but feels more congestion in last few day.  Has had symptom for a week.   Chest hurting yesterday.  Has had some sick contacts.  Son recently had strep, and her children were exposed to flu at school.   Feels mild SOB.  Not really having cough, has more head pressure and congestion.    Using no OTC medications for congestion.    Just started antibiotic per dentist 6 days for broken tooth on right upper jaw.  Is on clindamycin.    She ran out of several of her medications.  Her pharmacy is giving her hard time about Trulicity.  She is out of her headache medicines for migraines.  She is compliant with her blood pressure medicine and just got that refilled recently  No other aggravating or relieving factors.    No other c/o.  Past Medical History:  Diagnosis Date   Abnormal glucose tolerance in pregnancy 03/23/2014   Asherman syndrome    Diabetes mellitus without complication (HCC)    pt states has problems with BS, but not diabetic   Endometritis following delivery 12/03/2012   Gestational diabetes    Hypertension    Incompetence of cervix    PCOS (polycystic ovarian syndrome)    Prediabetes    S/P cesarean section 04/21/2014   Current Outpatient Medications on File Prior to Visit  Medication Sig Dispense Refill   Vitamin D, Ergocalciferol, (DRISDOL) 1.25 MG (50000 UNIT) CAPS capsule Take 1 capsule (50,000 Units total) by mouth every 7 (seven) days. 12 capsule 2   Blood Glucose Monitoring Suppl DEVI Test 1-2 times day. Pend on Insurance 1 each 0   cyanocobalamin (VITAMIN B12) 1000 MCG tablet  Take 1 tablet (1,000 mcg total) by mouth daily. (Patient not taking: Reported on 03/02/2023) 90 tablet 0   Glucose Blood (BLOOD GLUCOSE TEST STRIPS) STRP Test 1-2 times daily 100 strip 0   Lancet Device MISC 1-2 times daily 1 each 0   Lancets Misc. MISC 1-2 times a day 100 each 0   No current facility-administered medications on file prior to visit.     The following portions of the patient's history were reviewed and updated as appropriate: allergies, current medications, past family history, past medical history, past social history, past surgical history and problem list.  ROS Otherwise as in subjective above  Objective: BP 122/70   Pulse 83   Temp 97.7 F (36.5 C)   Wt 292 lb 6.4 oz (132.6 kg)   SpO2 98%   BMI 51.80 kg/m   General appearance: alert, no distress, well developed, well nourished Skin: There is a nodular appearance to the distal nose suggestive of rosacea HEENT: normocephalic, sclerae anicteric, conjunctiva pink and moist, TMs flat, nares with some mucoid discharge and erythema, pharynx with mild erythema Oral cavity: MMM, no lesions Neck: supple, no lymphadenopathy, no thyromegaly, no masses Chest wall nontender, normal I:E Heart: RRR, normal S1, S2, no murmurs Lungs: CTA bilaterally, no wheezes, rhonchi, or rales Pulses: 2+ radial pulses, 2+ pedal pulses, normal cap refill Ext: no  edema     Assessment: Encounter Diagnoses  Name Primary?   Nonintractable headache, unspecified chronicity pattern, unspecified headache type Yes   Head congestion    Tooth ache    Chest pressure    Type 2 diabetes mellitus with hyperglycemia, without long-term current use of insulin (HCC)    Essential hypertension, benign    Migraine with status migrainosus, not intractable, unspecified migraine type    Rosacea      Plan: Head congestion, headache, sinus and chest pressure-she is already on clindamycin for a tooth infection per dentist but not responding to that in  regards to her sinus symptoms.  Continue or finish out clindamycin and add 5 days of twice daily doxycycline.  Begin Mucinex DM for 5 days.  Consider nasal saline, good hydration, rest. If not much improved within the next 4 to 5 days and let me know  Migraines-restart back on Qulipta for prevention or maintenance and can continue Maxalt for abortive therapy, Zofran for nausea  Hypertension-continue valsartan HCT 320/25 mg daily  Rosacea-begin trial of doxycycline oral.  Will consider other remedies on recheck.  Diabetes-continue Trulicity, follow-up soon for med check and labs   Faydra was seen today for chest pain.  Diagnoses and all orders for this visit:  Nonintractable headache, unspecified chronicity pattern, unspecified headache type  Head congestion -     POC COVID-19 -     Influenza A/B  Tooth ache  Chest pressure  Type 2 diabetes mellitus with hyperglycemia, without long-term current use of insulin (HCC)  Essential hypertension, benign  Migraine with status migrainosus, not intractable, unspecified migraine type  Rosacea  Other orders -     Dulaglutide (TRULICITY) 0.75 MG/0.5ML SOAJ; Inject 0.75 mg into the skin once a week. -     Dulaglutide (TRULICITY) 1.5 MG/0.5ML SOAJ; Inject 1.5 mg into the skin once a week. -     doxycycline (VIBRA-TABS) 100 MG tablet; Take 1 tablet (100 mg total) by mouth 2 (two) times daily. BID x 5 days, then once daily for rosacea -     dextromethorphan-guaiFENesin (MUCINEX DM) 30-600 MG 12hr tablet; Take 1 tablet by mouth 2 (two) times daily. -     valsartan-hydrochlorothiazide (DIOVAN-HCT) 320-25 MG tablet; Take 1 tablet by mouth daily. -     Atogepant (QULIPTA) 60 MG TABS; Take 1 tablet (60 mg total) by mouth daily. -     ondansetron (ZOFRAN) 4 MG tablet; Take 1 tablet (4 mg total) by mouth every 8 (eight) hours as needed for nausea or vomiting. -     rizatriptan (MAXALT) 10 MG tablet; Take 1 tablet (10 mg total) by mouth as needed  for migraine. May repeat in 2 hours if needed.  Maximum 2 tablets in 24 hours.    Follow up: prn

## 2023-03-02 NOTE — Telephone Encounter (Signed)
 Pt was notified of results

## 2023-03-11 DIAGNOSIS — Z419 Encounter for procedure for purposes other than remedying health state, unspecified: Secondary | ICD-10-CM | POA: Diagnosis not present

## 2023-03-28 ENCOUNTER — Encounter: Payer: Medicaid Other | Admitting: Medical

## 2023-03-30 ENCOUNTER — Encounter: Admitting: Medical

## 2023-03-30 NOTE — Progress Notes (Deleted)
 Due today for diabetes and chornic dz med check and labs   03/02/23 restarted qulipta for prevention  Maxalt and zofran   Doxyx oral for rosacea  Due for diabetes f/u

## 2023-04-02 ENCOUNTER — Other Ambulatory Visit: Payer: Self-pay | Admitting: Medical

## 2023-04-03 NOTE — Telephone Encounter (Signed)
 Used for rosacea

## 2023-04-04 ENCOUNTER — Telehealth: Payer: Self-pay

## 2023-04-04 ENCOUNTER — Other Ambulatory Visit (HOSPITAL_COMMUNITY): Payer: Self-pay

## 2023-04-04 NOTE — Telephone Encounter (Signed)
   RTS Per pharmacy and also test claim

## 2023-04-10 ENCOUNTER — Encounter: Admitting: Medical

## 2023-04-11 ENCOUNTER — Other Ambulatory Visit (HOSPITAL_COMMUNITY): Payer: Self-pay

## 2023-04-22 DIAGNOSIS — Z419 Encounter for procedure for purposes other than remedying health state, unspecified: Secondary | ICD-10-CM | POA: Diagnosis not present

## 2023-05-22 DIAGNOSIS — Z419 Encounter for procedure for purposes other than remedying health state, unspecified: Secondary | ICD-10-CM | POA: Diagnosis not present

## 2023-06-22 DIAGNOSIS — Z419 Encounter for procedure for purposes other than remedying health state, unspecified: Secondary | ICD-10-CM | POA: Diagnosis not present

## 2023-07-19 ENCOUNTER — Ambulatory Visit: Admitting: Medical

## 2023-07-22 DIAGNOSIS — Z419 Encounter for procedure for purposes other than remedying health state, unspecified: Secondary | ICD-10-CM | POA: Diagnosis not present

## 2023-07-24 ENCOUNTER — Ambulatory Visit: Admitting: Medical

## 2023-07-24 ENCOUNTER — Encounter: Payer: Self-pay | Admitting: Medical

## 2023-07-24 VITALS — BP 122/70 | HR 94 | Ht 64.25 in | Wt 285.6 lb

## 2023-07-24 DIAGNOSIS — Z13220 Encounter for screening for lipoid disorders: Secondary | ICD-10-CM | POA: Diagnosis not present

## 2023-07-24 DIAGNOSIS — E1165 Type 2 diabetes mellitus with hyperglycemia: Secondary | ICD-10-CM | POA: Diagnosis not present

## 2023-07-24 DIAGNOSIS — E559 Vitamin D deficiency, unspecified: Secondary | ICD-10-CM

## 2023-07-24 DIAGNOSIS — I10 Essential (primary) hypertension: Secondary | ICD-10-CM | POA: Diagnosis not present

## 2023-07-24 DIAGNOSIS — E538 Deficiency of other specified B group vitamins: Secondary | ICD-10-CM

## 2023-07-24 DIAGNOSIS — E66813 Obesity, class 3: Secondary | ICD-10-CM

## 2023-07-24 LAB — LIPID PANEL

## 2023-07-24 NOTE — Progress Notes (Signed)
 Subjective:  Erika Townsend is a 33 y.o. female who presents for Chief Complaint  Patient presents with   Consult    Discuss weight loss medication. Stopped taking Trucuility June 20th as she has developed knots on stomach.      Here for med check.  Fasting today.    Hypertension-compliant with valsartan  HCT 320/25 mg daily.   BP at home has been good.    Vitamin D  deficiency -compliant with vitamin D  50,000 weekly dose  Diabetes.  She was previously on Trulicity  to help with diabetes and weight loss.   She stopped it as she was getting some nodules under the skin beneath belly button.  Didn't tolerate metformin  in the past and ozempic  made her have no appetite.  Not checking glucose.  No glucometer at home.  Exercising with walking, some resistance training with dumballs as well.    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   No other aggravating or relieving factors.    No other c/o.  Past Medical History:  Diagnosis Date   Abnormal glucose tolerance in pregnancy 03/23/2014   Asherman syndrome    Diabetes mellitus without complication (HCC)    pt states has problems with BS, but not diabetic   Endometritis following delivery 12/03/2012   Gestational diabetes    Hypertension    Incompetence of cervix    PCOS (polycystic ovarian syndrome)    S/P cesarean section 04/21/2014   Current Outpatient Medications on File Prior to Visit  Medication Sig Dispense Refill   cyanocobalamin (VITAMIN B12) 1000 MCG tablet Take 1 tablet (1,000 mcg total) by mouth daily. 90 tablet 0   valsartan -hydrochlorothiazide  (DIOVAN -HCT) 320-25 MG tablet Take 1 tablet by mouth daily. 90 tablet 2   Vitamin D , Ergocalciferol , (DRISDOL ) 1.25 MG (50000 UNIT) CAPS capsule Take 1 capsule (50,000 Units total) by mouth every 7 (seven) days. 12 capsule 2   Atogepant  (QULIPTA ) 60 MG TABS Take 1 tablet (60 mg total) by mouth daily. (Patient not taking:  Reported on 07/24/2023) 30 tablet 2   Blood Glucose Monitoring Suppl DEVI Test 1-2 times day. Pend on Insurance 1 each 0   Glucose Blood (BLOOD GLUCOSE TEST STRIPS) STRP Test 1-2 times daily 100 strip 0   Lancet Device MISC 1-2 times daily 1 each 0   Lancets Misc. MISC 1-2 times a day 100 each 0   Rimegepant Sulfate (NURTEC) 75 MG TBDP Take 1 tablet (75 mg total) by mouth every other day. 20 tablet 1   rizatriptan  (MAXALT ) 10 MG tablet Take 1 tablet (10 mg total) by mouth as needed for migraine. May repeat in 2 hours if needed.  Maximum 2 tablets in 24 hours. 10 tablet 1   No current facility-administered medications on file prior to visit.     The following portions of the patient's history were reviewed and updated as appropriate: allergies, current medications, past family history, past medical history, past social history, past surgical history and problem list.  ROS Otherwise as in subjective above    Objective: BP 122/70   Pulse 94   Ht 5' 4.25 (1.632 m)   Wt 285 lb 9.6 oz (129.5 kg)   SpO2 100%   BMI 48.64 kg/m   Wt Readings from Last 3 Encounters:  07/24/23 285 lb 9.6 oz (129.5 kg)  03/02/23 292 lb 6.4 oz (132.6 kg)  11/11/22 282 lb (127.9 kg)   General  appearance: alert, no distress, well developed, well nourished Neck: supple, no lymphadenopathy, no thyromegaly, no masses Heart: RRR, normal S1, S2, no murmurs Lungs: CTA bilaterally, no wheezes, rhonchi, or rales Abdomen: +bs, soft, non tender, non distended, no masses, no hepatomegaly, no splenomegaly Small pedunculated skin tags on right lateral abdomen and inferior to right breast, no nodules palpated in lower abdomen though Pulses: 2+ radial pulses, 2+ pedal pulses, normal cap refill Ext: no edema  Diabetic Foot Exam - Simple   Simple Foot Form Diabetic Foot exam was performed with the following findings: Yes 07/24/2023 10:10 AM  Visual Inspection No deformities, no ulcerations, no other skin breakdown  bilaterally: Yes Sensation Testing Intact to touch and monofilament testing bilaterally: Yes Pulse Check Posterior Tibialis and Dorsalis pulse intact bilaterally: Yes Comments      Assessment: Encounter Diagnoses  Name Primary?   Essential hypertension, benign Yes   Type 2 diabetes mellitus with hyperglycemia, without long-term current use of insulin (HCC)    Vitamin D  deficiency    B12 deficiency    Obesity, Class III, BMI 40-49.9 (morbid obesity)    Morbid obesity (HCC)    Screening for lipid disorders      Plan: Hypertension-continue valsartan  HCT 320/25 mg daily  Diabetes-updated labs today.  She did not tolerate metformin  in the past.  She did not have basically any appetite on Ozempic .  She is willing to do another injection.  She would prefer not to do Trulicity  as she was getting nodules the pop up in her lower abdomen.  Morbid obesity-counseled on the need to lose weight through healthy diet and exercise.  Pending labs will likely begin trial of Mounjaro  .  We also discussed some oral medication such as Qsymia and Contrave  B12 deficiency-updated labs today.   Not currently on supplement.  Vitamin D  deficiency-updated labs today.  Is compliant with supplement  Lipid screening today    [name] was seen today for consult.  Diagnoses and all orders for this visit:  Essential hypertension, benign  Type 2 diabetes mellitus with hyperglycemia, without long-term current use of insulin (HCC) -     Comprehensive metabolic panel with GFR -     Hemoglobin A1c  Vitamin D  deficiency -     Comprehensive metabolic panel with GFR -     VITAMIN D  25 Hydroxy (Vit-D Deficiency, Fractures)  B12 deficiency -     CBC -     Vitamin B12  Obesity, Class III, BMI 40-49.9 (morbid obesity)  Morbid obesity (HCC) -     Hemoglobin A1c -     TSH + free T4 -     Vitamin B12  Screening for lipid disorders -     Lipid panel    Follow up: pending labs

## 2023-07-25 ENCOUNTER — Telehealth: Payer: Self-pay

## 2023-07-25 ENCOUNTER — Other Ambulatory Visit (HOSPITAL_COMMUNITY): Payer: Self-pay

## 2023-07-25 ENCOUNTER — Other Ambulatory Visit: Payer: Self-pay | Admitting: Medical

## 2023-07-25 ENCOUNTER — Ambulatory Visit: Payer: Self-pay | Admitting: Medical

## 2023-07-25 LAB — COMPREHENSIVE METABOLIC PANEL WITH GFR
ALT: 15 IU/L (ref 0–32)
AST: 15 IU/L (ref 0–40)
Albumin: 3.9 g/dL (ref 3.9–4.9)
Alkaline Phosphatase: 52 IU/L (ref 44–121)
BUN/Creatinine Ratio: 10 (ref 9–23)
BUN: 9 mg/dL (ref 6–20)
Bilirubin Total: 0.4 mg/dL (ref 0.0–1.2)
CO2: 22 mmol/L (ref 20–29)
Calcium: 8.9 mg/dL (ref 8.7–10.2)
Chloride: 104 mmol/L (ref 96–106)
Creatinine, Ser: 0.89 mg/dL (ref 0.57–1.00)
Globulin, Total: 2.8 g/dL (ref 1.5–4.5)
Glucose: 97 mg/dL (ref 70–99)
Potassium: 4 mmol/L (ref 3.5–5.2)
Sodium: 140 mmol/L (ref 134–144)
Total Protein: 6.7 g/dL (ref 6.0–8.5)
eGFR: 87 mL/min/1.73 (ref >59)

## 2023-07-25 LAB — CBC
Hematocrit: 36.7 % (ref 34.0–46.6)
Hemoglobin: 11.7 g/dL (ref 11.1–15.9)
MCH: 28.1 pg (ref 26.6–33.0)
MCHC: 31.9 g/dL (ref 31.5–35.7)
MCV: 88 fL (ref 79–97)
Platelets: 234 x10E3/uL (ref 150–450)
RBC: 4.17 x10E6/uL (ref 3.77–5.28)
RDW: 12.8 % (ref 11.7–15.4)
WBC: 8.9 x10E3/uL (ref 3.4–10.8)

## 2023-07-25 LAB — TSH+FREE T4
Free T4: 1.12 ng/dL (ref 0.82–1.77)
TSH: 1.13 u[IU]/mL (ref 0.450–4.500)

## 2023-07-25 LAB — LIPID PANEL
Cholesterol, Total: 126 mg/dL (ref 100–199)
HDL: 36 mg/dL — AB (ref >39)
LDL CALC COMMENT:: 3.5 ratio (ref 0.0–4.4)
LDL Chol Calc (NIH): 75 mg/dL (ref 0–99)
Triglycerides: 75 mg/dL (ref 0–149)
VLDL Cholesterol Cal: 15 mg/dL (ref 5–40)

## 2023-07-25 LAB — HEMOGLOBIN A1C
Est. average glucose Bld gHb Est-mCnc: 123 mg/dL
Hgb A1c MFr Bld: 5.9 % — ABNORMAL HIGH (ref 4.8–5.6)

## 2023-07-25 LAB — VITAMIN D 25 HYDROXY (VIT D DEFICIENCY, FRACTURES): Vit D, 25-Hydroxy: 32.4 ng/mL (ref 30.0–100.0)

## 2023-07-25 LAB — VITAMIN B12: Vitamin B-12: 209 pg/mL — AB (ref 232–1245)

## 2023-07-25 MED ORDER — MOUNJARO 2.5 MG/0.5ML ~~LOC~~ SOAJ: 2.5000 mg | SUBCUTANEOUS | 0 refills | Status: DC

## 2023-07-25 MED ORDER — VITAMIN D (ERGOCALCIFEROL) 1.25 MG (50000 UNIT) PO CAPS: 50000.0000 [IU] | ORAL_CAPSULE | ORAL | 1 refills | Status: AC

## 2023-07-25 MED ORDER — MOUNJARO 5 MG/0.5ML ~~LOC~~ SOAJ: 5.0000 mg | SUBCUTANEOUS | 1 refills | Status: DC

## 2023-07-25 MED ORDER — VITAMIN B-12 1000 MCG PO TABS: 1000.0000 ug | ORAL_TABLET | Freq: Every day | ORAL | 1 refills | Status: AC

## 2023-07-25 MED ORDER — NURTEC 75 MG PO TBDP: 1.0000 | ORAL_TABLET | ORAL | 1 refills | Status: AC

## 2023-07-25 NOTE — Telephone Encounter (Signed)
 Pharmacy Patient Advocate Encounter   Received notification from CoverMyMeds that prior authorization for Nurtec 75MG  dispersible tablets is required/requested.   Insurance verification completed.   The patient is insured through [facility] [employer] [place] .   Per test claim: PA required; PA submitted to above mentioned insurance via CoverMyMeds Key/confirmation #/EOC (Key: [id number])   Status is pending

## 2023-07-25 NOTE — Progress Notes (Signed)
 Results sent through MyChart

## 2023-07-25 NOTE — Telephone Encounter (Signed)
 Pharmacy Patient Advocate Encounter  Received notification from [facility] Medicaid that Prior Authorization for Mounjaro  2.5MG /0.5ML auto-injectors has been APPROVED  to 7.15.26. Ran test claim, Copay is $4.00. This test claim was processed through [facility]- copay amounts may vary at other pharmacies due to pharmacy/plan contracts, or as the patient moves through the different stages of their insurance plan.   PA #/Case ID/Reference #: (Key: [id number])

## 2023-07-25 NOTE — Telephone Encounter (Signed)
 Pharmacy Patient Advocate Encounter   Received notification from CoverMyMeds that prior authorization for Mounjaro  2.5MG /0.5ML auto-injectors is required/requested.   Insurance verification completed.   The patient is insured through [facility] [place] .   Per test claim: PA required; PA submitted to above mentioned insurance via CoverMyMeds Key/confirmation #/EOC (Key: [id number])    Status is pending

## 2023-07-25 NOTE — Telephone Encounter (Signed)
 Pharmacy Patient Advocate Encounter  Received notification from [facility] Medicaid that Prior Authorization for Nurtec 75MG  dispersible tablets has been APPROVED from 7.1.25 to 7.15.26. Ran test claim, Copay is $4.00.SABRA This test claim was processed through [facility]- copay amounts may vary at other pharmacies due to pharmacy/plan contracts, or as the patient moves through the different stages of their insurance plan.   PA #/Case ID/Reference #: (Key: [id number])   Rx can't be filled for more than 17tab/34 day supply copay 4.00

## 2023-07-26 ENCOUNTER — Other Ambulatory Visit (HOSPITAL_COMMUNITY): Payer: Self-pay

## 2023-07-26 ENCOUNTER — Other Ambulatory Visit: Payer: Self-pay | Admitting: Medical

## 2023-07-26 MED ORDER — MOUNJARO 2.5 MG/0.5ML ~~LOC~~ SOAJ: 2.5000 mg | SUBCUTANEOUS | 0 refills | Status: DC

## 2023-07-26 MED ORDER — ONDANSETRON HCL 4 MG PO TABS: 4.0000 mg | ORAL_TABLET | Freq: Every day | ORAL | 1 refills | Status: AC | PRN

## 2023-07-26 MED ORDER — MOUNJARO 5 MG/0.5ML ~~LOC~~ SOAJ: 5.0000 mg | SUBCUTANEOUS | 1 refills | Status: DC

## 2023-07-27 NOTE — Telephone Encounter (Signed)
 Drug Change request: Mounjaro  2.5 mg/.5ml not covered on pt plan  Preferred Alt. Are Ozempic -Trulicity -Victozapak

## 2023-08-04 ENCOUNTER — Other Ambulatory Visit (HOSPITAL_COMMUNITY): Payer: Self-pay

## 2023-08-14 ENCOUNTER — Other Ambulatory Visit (HOSPITAL_COMMUNITY): Payer: Self-pay

## 2023-08-22 DIAGNOSIS — Z419 Encounter for procedure for purposes other than remedying health state, unspecified: Secondary | ICD-10-CM | POA: Diagnosis not present

## 2023-09-05 ENCOUNTER — Telehealth (INDEPENDENT_AMBULATORY_CARE_PROVIDER_SITE_OTHER): Admitting: Family Medicine

## 2023-09-05 ENCOUNTER — Telehealth: Payer: Self-pay

## 2023-09-05 ENCOUNTER — Telehealth: Payer: Self-pay | Admitting: Internal Medicine

## 2023-09-05 DIAGNOSIS — J069 Acute upper respiratory infection, unspecified: Secondary | ICD-10-CM | POA: Diagnosis not present

## 2023-09-05 LAB — POC COVID19/FLU A&B COMBO
Covid Antigen, POC: POSITIVE — AB
Influenza A Antigen, POC: NEGATIVE
Influenza B Antigen, POC: NEGATIVE

## 2023-09-05 LAB — POCT RAPID STREP A (OFFICE): Rapid Strep A Screen: NEGATIVE

## 2023-09-05 NOTE — Telephone Encounter (Signed)
 Copied from CRM [id number]. Topic: General - Other >> Sep 05, 2023 12:07 PM Roselie BROCKS wrote: Reason for CRM: Patient called in wanting to know if she  can get a doctors note showing she needs to be out for  a certain amount of time. And to please give her a cal one completed, >> Sep 05, 2023  2:05 PM Antwanette L wrote: Patient is calling back b/c her work excuse was not sent to [employer]. The patient tested positive for Covid.

## 2023-09-05 NOTE — Addendum Note (Signed)
 Addended by: LATTIE CARLO BROCKS on: 09/05/2023 12:09 PM   Modules accepted: Orders

## 2023-09-05 NOTE — Progress Notes (Signed)
   Name: Erika Townsend   Date of Visit: 09/05/23   Date of last visit with me: Visit date not found   CHIEF COMPLAINT:  Chief Complaint  Patient presents with   Acute Visit    Sore throat, headache, congestion, emesis.        HPI:  Discussed the use of AI scribe software for clinical note transcription with the patient, who gave verbal consent to proceed.  History of Present Illness Erika Townsend is a 33 year old female who presents with headache, nasal congestion, and body aches.  She has been experiencing a headache, nasal congestion, and body aches since yesterday. She feels warm with chills but has not taken any medications for these symptoms yet.  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 She mentions the need to get tested due to workplace requirements.  She has a history of sinus infections, typically associated with weather changes, but notes that she has never experienced body aches with her sinus infections before.  She has a mild cough, which she attributes to trying to clear her throat, but it is not persistent.     OBJECTIVE:       02/11/2022   10:41 AM  Depression screen PHQ 2/9  Decreased Interest 0  Down, Depressed, Hopeless 0  PHQ - 2 Score 0     BP Readings from Last 3 Encounters:  07/24/23 122/70  03/02/23 122/70  11/11/22 (!) 179/100    There were no vitals taken for this visit.   Physical Exam   Physical Exam Constitutional:      General: She is not in acute distress.    Appearance: She is ill-appearing.  Neurological:     Mental Status: She is alert.     ASSESSMENT/PLAN:   Assessment & Plan Viral URI    Assessment and Plan Assessment & Plan Acute viral upper respiratory infection Symptoms suggest viral etiology due to recent onset and exposure. Differential includes sinus infection, but myalgia is atypical. - Alternating ibuprofen  and acetaminophen : 1000 mg acetaminophen , 600 mg  ibuprofen  four hours later, repeat. - Steroids deemed unnecessary due to potential hyperglycemia. - Testing for influenza, COVID-19, and streptococcal infection discussed and to be performed. Swabbing in car by medical assistant.     Erika Townsend A. Vita MD [facility]

## 2023-09-05 NOTE — Telephone Encounter (Unsigned)
 Copied from CRM [id number]. Topic: General - Other >> Sep 05, 2023 12:07 PM Roselie BROCKS wrote: Reason for CRM: Patient called in wanting to know if she  can get a doctors note showing she needs to be out for  a certain amount of time. And to please give her a cal one completed,

## 2023-09-06 NOTE — Telephone Encounter (Signed)
 Yes Erika Townsend did it yesterday for us !

## 2023-09-22 DIAGNOSIS — Z419 Encounter for procedure for purposes other than remedying health state, unspecified: Secondary | ICD-10-CM | POA: Diagnosis not present

## 2023-10-04 ENCOUNTER — Other Ambulatory Visit: Payer: Self-pay | Admitting: Medical

## 2023-10-04 MED ORDER — TIRZEPATIDE 7.5 MG/0.5ML ~~LOC~~ SOAJ: 7.5000 mg | SUBCUTANEOUS | 0 refills | Status: AC

## 2023-10-04 NOTE — Telephone Encounter (Signed)
Does this need to be updated?

## 2023-10-04 NOTE — Telephone Encounter (Signed)
Does this need to be increased?

## 2023-10-22 DIAGNOSIS — Z419 Encounter for procedure for purposes other than remedying health state, unspecified: Secondary | ICD-10-CM | POA: Diagnosis not present

## 2023-11-13 ENCOUNTER — Encounter: Payer: Self-pay | Admitting: Radiology

## 2023-11-22 DIAGNOSIS — Z419 Encounter for procedure for purposes other than remedying health state, unspecified: Secondary | ICD-10-CM | POA: Diagnosis not present

## 2023-11-27 ENCOUNTER — Other Ambulatory Visit: Payer: Self-pay | Admitting: Medical

## 2023-11-27 DIAGNOSIS — L719 Rosacea, unspecified: Secondary | ICD-10-CM

## 2023-12-22 DIAGNOSIS — Z419 Encounter for procedure for purposes other than remedying health state, unspecified: Secondary | ICD-10-CM | POA: Diagnosis not present
